# Patient Record
Sex: Male | Born: 1970
Health system: Southern US, Community
[De-identification: ages and names within clinical notes are randomized; demographics above are authoritative.]

## PROBLEM LIST (undated history)

## (undated) DIAGNOSIS — K219 Gastro-esophageal reflux disease without esophagitis: Secondary | ICD-10-CM

## (undated) DIAGNOSIS — E785 Hyperlipidemia, unspecified: Secondary | ICD-10-CM

## (undated) DIAGNOSIS — J302 Other seasonal allergic rhinitis: Secondary | ICD-10-CM

## (undated) DIAGNOSIS — M1990 Unspecified osteoarthritis, unspecified site: Secondary | ICD-10-CM

## (undated) DIAGNOSIS — R519 Headache, unspecified: Secondary | ICD-10-CM

## (undated) DIAGNOSIS — I10 Essential (primary) hypertension: Secondary | ICD-10-CM

## (undated) HISTORY — DX: Headache, unspecified: R51.9

## (undated) HISTORY — DX: Other seasonal allergic rhinitis: J30.2

## (undated) HISTORY — PX: WISDOM TOOTH EXTRACTION: SHX21

## (undated) HISTORY — DX: Hyperlipidemia, unspecified: E78.5

## (undated) HISTORY — DX: Unspecified osteoarthritis, unspecified site: M19.90

## (undated) HISTORY — DX: Essential (primary) hypertension: I10

## (undated) HISTORY — DX: Gastro-esophageal reflux disease without esophagitis: K21.9

---

## 2002-09-23 HISTORY — PX: KNEE SURGERY: SHX244

## 2005-09-23 HISTORY — PX: KNEE SURGERY: SHX244

## 2013-12-15 ENCOUNTER — Emergency Department (HOSPITAL_BASED_OUTPATIENT_CLINIC_OR_DEPARTMENT_OTHER)
Admission: EM | Admit: 2013-12-15 | Discharge: 2013-12-15 | Disposition: A | Discharge status: Home / Self Care | Payer: Managed Care, Other (non HMO) | Attending: Emergency Medicine | Admitting: Emergency Medicine

## 2013-12-15 ENCOUNTER — Emergency Department (HOSPITAL_BASED_OUTPATIENT_CLINIC_OR_DEPARTMENT_OTHER): Payer: Managed Care, Other (non HMO)

## 2013-12-15 ENCOUNTER — Encounter (HOSPITAL_BASED_OUTPATIENT_CLINIC_OR_DEPARTMENT_OTHER): Payer: Self-pay | Admitting: Emergency Medicine

## 2013-12-15 DIAGNOSIS — J020 Streptococcal pharyngitis: Secondary | ICD-10-CM | POA: Insufficient documentation

## 2013-12-15 DIAGNOSIS — R5383 Other fatigue: Secondary | ICD-10-CM

## 2013-12-15 DIAGNOSIS — J159 Unspecified bacterial pneumonia: Secondary | ICD-10-CM | POA: Insufficient documentation

## 2013-12-15 DIAGNOSIS — F17200 Nicotine dependence, unspecified, uncomplicated: Secondary | ICD-10-CM | POA: Insufficient documentation

## 2013-12-15 DIAGNOSIS — J189 Pneumonia, unspecified organism: Secondary | ICD-10-CM

## 2013-12-15 DIAGNOSIS — R61 Generalized hyperhidrosis: Secondary | ICD-10-CM | POA: Insufficient documentation

## 2013-12-15 DIAGNOSIS — R42 Dizziness and giddiness: Secondary | ICD-10-CM | POA: Insufficient documentation

## 2013-12-15 DIAGNOSIS — R5381 Other malaise: Secondary | ICD-10-CM | POA: Insufficient documentation

## 2013-12-15 LAB — BASIC METABOLIC PANEL
BUN: 12 mg/dL (ref 6–23)
CHLORIDE: 97 meq/L (ref 96–112)
CO2: 24 mEq/L (ref 19–32)
Calcium: 9.8 mg/dL (ref 8.4–10.5)
Creatinine, Ser: 1.1 mg/dL (ref 0.50–1.35)
GFR calc non Af Amer: 81 mL/min — ABNORMAL LOW (ref >90)
Glucose, Bld: 123 mg/dL — ABNORMAL HIGH (ref 70–99)
Potassium: 3.7 mEq/L (ref 3.7–5.3)
Sodium: 137 mEq/L (ref 137–147)

## 2013-12-15 LAB — URINALYSIS, ROUTINE W REFLEX MICROSCOPIC
Bilirubin Urine: NEGATIVE
Glucose, UA: NEGATIVE mg/dL
Hgb urine dipstick: NEGATIVE
Ketones, ur: 15 mg/dL — AB
LEUKOCYTES UA: NEGATIVE
NITRITE: NEGATIVE
PROTEIN: 30 mg/dL — AB
Specific Gravity, Urine: 1.031 — ABNORMAL HIGH (ref 1.005–1.030)
UROBILINOGEN UA: 1 mg/dL (ref 0.0–1.0)
pH: 6 (ref 5.0–8.0)

## 2013-12-15 LAB — CBC WITH DIFFERENTIAL/PLATELET
BASOS ABS: 0 10*3/uL (ref 0.0–0.1)
BASOS PCT: 0 % (ref 0–1)
Eosinophils Absolute: 0.1 10*3/uL (ref 0.0–0.7)
Eosinophils Relative: 0 % (ref 0–5)
HCT: 42 % (ref 39.0–52.0)
Hemoglobin: 14.7 g/dL (ref 13.0–17.0)
Lymphocytes Relative: 24 % (ref 12–46)
Lymphs Abs: 4.3 10*3/uL — ABNORMAL HIGH (ref 0.7–4.0)
MCH: 31.3 pg (ref 26.0–34.0)
MCHC: 35 g/dL (ref 30.0–36.0)
MCV: 89.4 fL (ref 78.0–100.0)
Monocytes Absolute: 1.2 10*3/uL — ABNORMAL HIGH (ref 0.1–1.0)
Monocytes Relative: 7 % (ref 3–12)
NEUTROS ABS: 12.3 10*3/uL — AB (ref 1.7–7.7)
NEUTROS PCT: 69 % (ref 43–77)
PLATELETS: 188 10*3/uL (ref 150–400)
RBC: 4.7 MIL/uL (ref 4.22–5.81)
RDW: 13.4 % (ref 11.5–15.5)
WBC: 17.9 10*3/uL — ABNORMAL HIGH (ref 4.0–10.5)

## 2013-12-15 LAB — I-STAT CG4 LACTIC ACID, ED: Lactic Acid, Venous: 2.17 mmol/L (ref 0.5–2.2)

## 2013-12-15 LAB — URINE MICROSCOPIC-ADD ON

## 2013-12-15 LAB — RAPID STREP SCREEN (MED CTR MEBANE ONLY): Streptococcus, Group A Screen (Direct): POSITIVE — AB

## 2013-12-15 IMAGING — CR DG CHEST 2V
2 series · 2 of 2 positions shown · non-contrast
Comparison: None.

CLINICAL DATA: Right-sided chest pain with fever

EXAM:
CHEST  2 VIEW

[w chest pa]
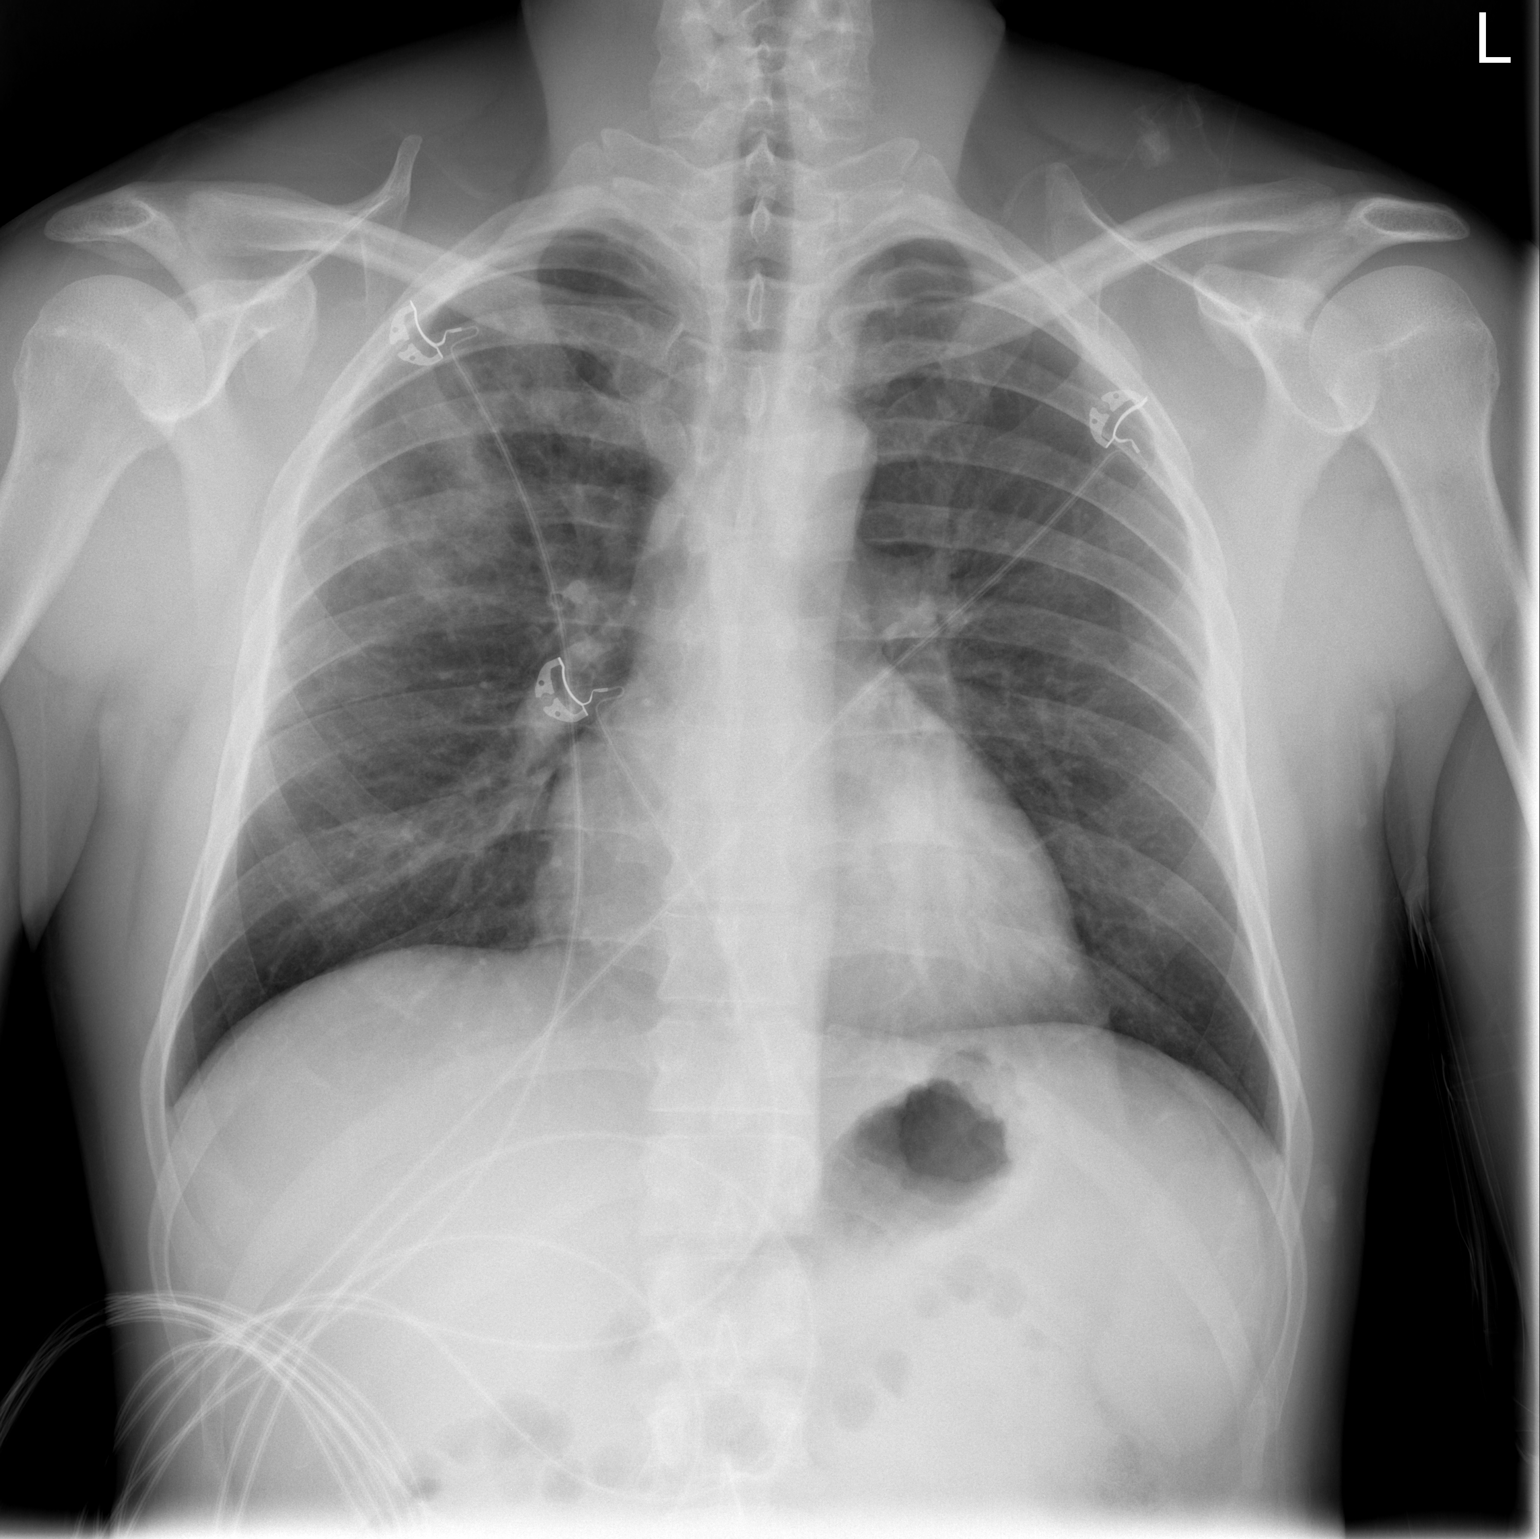

[w chest lat]
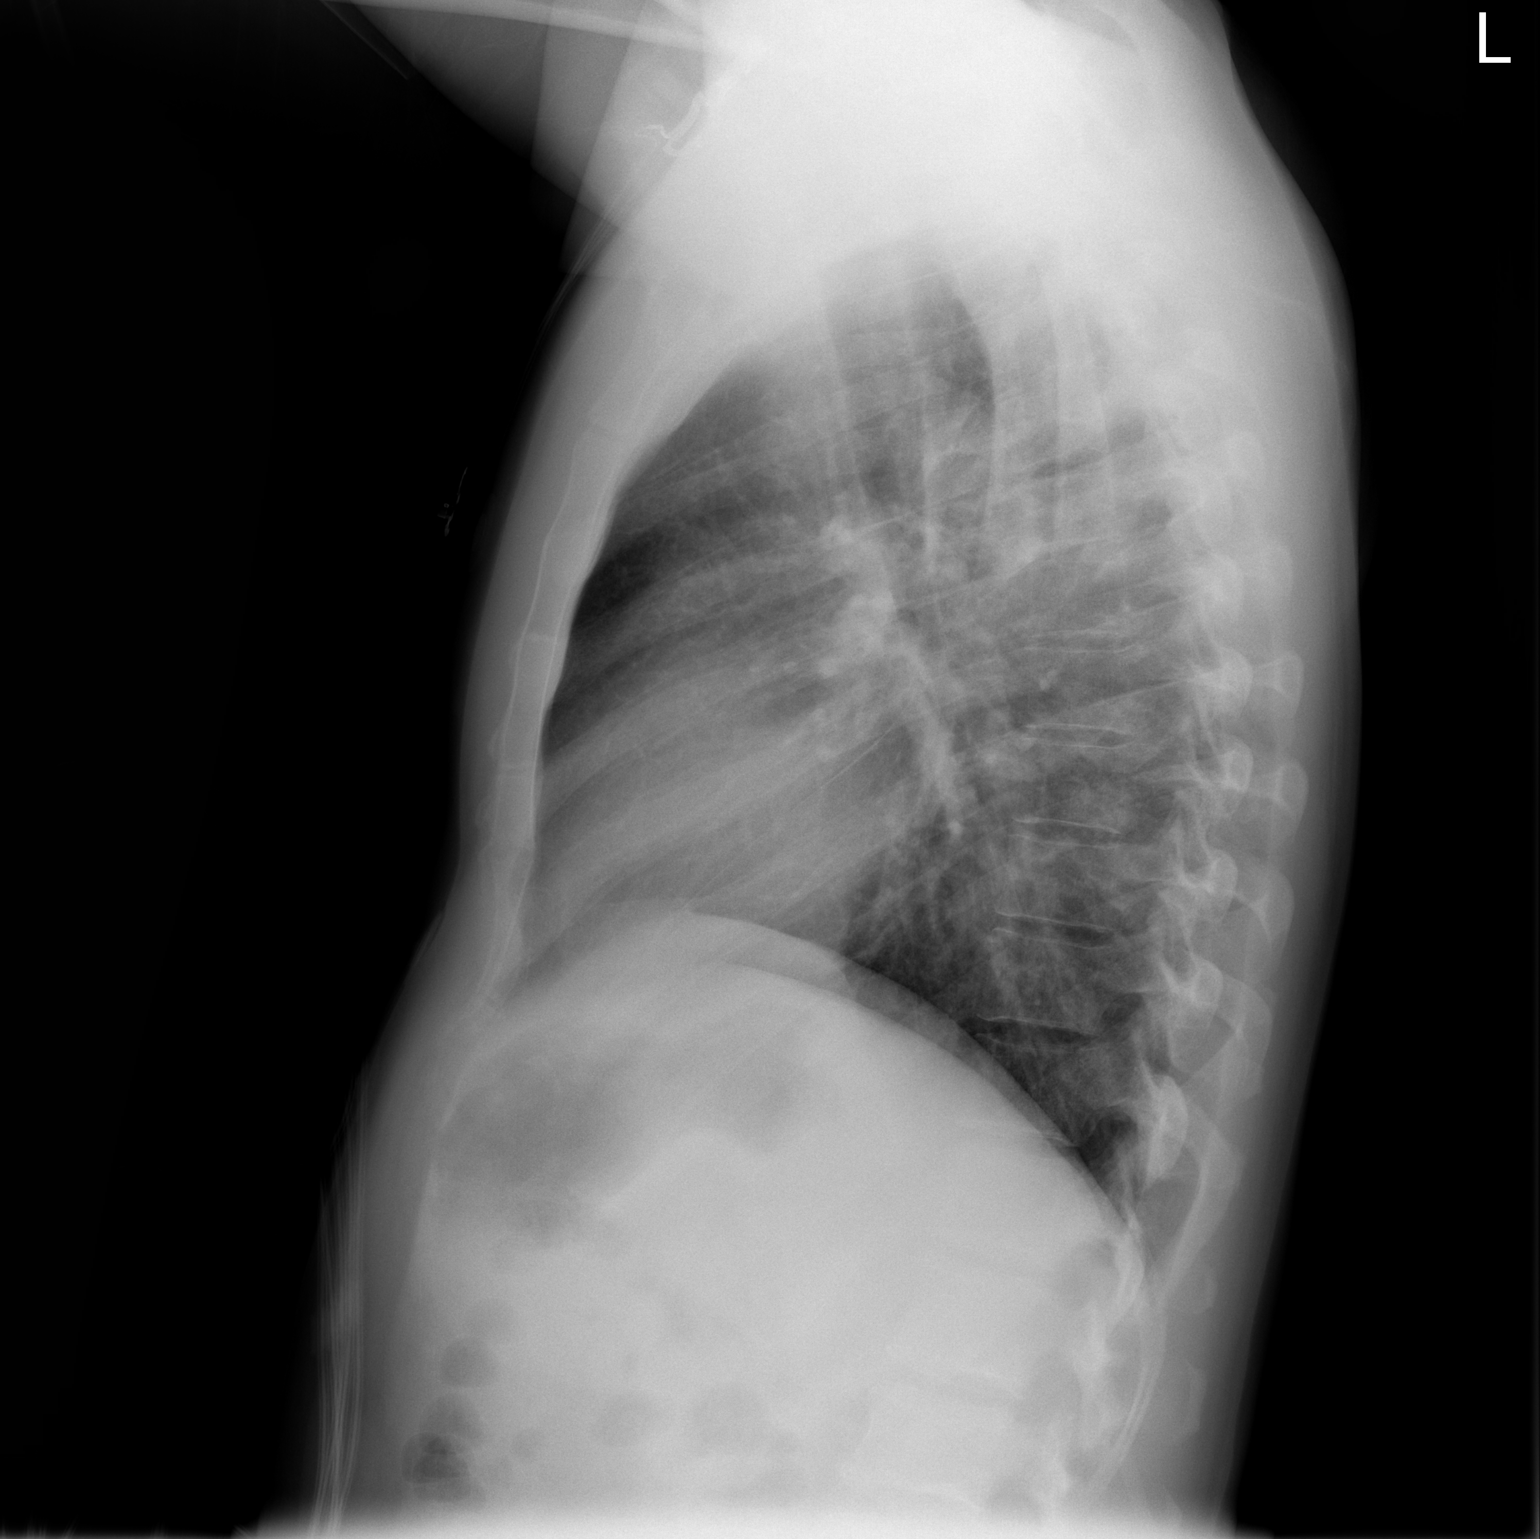

[2 of 2 positions shown; findings below may reference images not displayed]

FINDINGS: Cardiac shadow is stable. The left lung is clear. Right lung is well
aerated but demonstrates patchy infiltrative change in the upper
lung field projecting in the posterior aspect of the right upper
lobe consistent with acute pneumonia. The bony structures are within
normal limits.
IMPRESSION: Right upper lobe pneumonia.

## 2013-12-15 MED ORDER — ACETAMINOPHEN 500 MG PO TABS
1000.0000 mg | ORAL_TABLET | Freq: Once | ORAL | Status: AC
  Administered 2013-12-15: 1000 mg via ORAL
  Filled 2013-12-15: qty 2

## 2013-12-15 MED ORDER — LEVOFLOXACIN 750 MG PO TABS: 750.0000 mg | ORAL_TABLET | Freq: Every day | ORAL | Status: DC

## 2013-12-15 MED ORDER — SODIUM CHLORIDE 0.9 % IV BOLUS (SEPSIS): 1000.0000 mL | Freq: Once | INTRAVENOUS | Status: DC

## 2013-12-15 MED ORDER — SODIUM CHLORIDE 0.9 % IV BOLUS (SEPSIS)
1000.0000 mL | Freq: Once | INTRAVENOUS | Status: AC
  Administered 2013-12-15: 1000 mL via INTRAVENOUS

## 2013-12-15 MED ORDER — ONDANSETRON HCL 4 MG/2ML IJ SOLN
4.0000 mg | Freq: Once | INTRAMUSCULAR | Status: AC
  Administered 2013-12-15: 4 mg via INTRAVENOUS
  Filled 2013-12-15: qty 2

## 2013-12-15 MED ORDER — IOHEXOL 350 MG/ML SOLN
100.0000 mL | Freq: Once | INTRAVENOUS | Status: AC | PRN
  Administered 2013-12-15: 100 mL via INTRAVENOUS

## 2013-12-15 MED ORDER — LEVOFLOXACIN IN D5W 750 MG/150ML IV SOLN
750.0000 mg | Freq: Once | INTRAVENOUS | Status: AC
Start: 2013-12-15 — End: 2013-12-15
  Administered 2013-12-15: 750 mg via INTRAVENOUS
  Filled 2013-12-15: qty 150

## 2013-12-15 MED ORDER — MORPHINE SULFATE 4 MG/ML IJ SOLN
4.0000 mg | Freq: Once | INTRAMUSCULAR | Status: AC
  Administered 2013-12-15: 4 mg via INTRAVENOUS
  Filled 2013-12-15: qty 1

## 2013-12-15 MED ORDER — HYDROCODONE-ACETAMINOPHEN 5-325 MG PO TABS: 2.0000 | ORAL_TABLET | ORAL | Status: DC | PRN

## 2013-12-15 NOTE — ED Provider Notes (Signed)
CSN: [id number]     Arrival date & time 12/15/13  1335 History   None    Chief Complaint  Patient presents with  . Shortness of Breath  . Chest Pain  . Fever     (Consider location/radiation/quality/duration/timing/severity/associated sxs/prior Treatment) HPI Comments: Patient presents to your for evaluation of left chest and rib pain. Patient reports that he had sudden onset of severe pain just prior to arrival.  Patient indicates that he has been sick for 2 days. He has been running a fever at home. This has been associated with sore throat. He has been taking therapy with improvement. He reports that he felt better last night, so he went to work today. He was feeling fine all day, when he suddenly had onset of pain in the left chest wall region while sitting in his work vehicle. Pain has been sharp, stabbing and severe. He reports that he thinks he felt a pop just prior to the onset of the pain. Patient reports that he became very sweaty, weak, dizzy and felt like he was going to pass out.  He has not had any cough or shortness of breath. Patient denies abdominal pain, nausea, vomiting, diarrhea.   History reviewed. No pertinent past medical history. History reviewed. No pertinent past surgical history. No family history on file. History  Substance Use Topics  . Smoking status: Current Every Day Smoker  . Smokeless tobacco: Not on file  . Alcohol Use: No    Review of Systems  Constitutional: Positive for fever, chills and diaphoresis.  HENT: Positive for sore throat.   Cardiovascular: Positive for chest pain.  Gastrointestinal: Negative.   Genitourinary: Negative.   Neurological: Positive for dizziness.  All other systems reviewed and are negative.      Allergies  Review of patient's allergies indicates no known allergies.  Home Medications  No current outpatient prescriptions on file. BP 145/98  Pulse 115  Temp(Src) 103.2 F (39.6 C) (Oral)  Resp 25  Ht 5\' 6"   (1.676 m)  Wt 165 lb (74.844 kg)  BMI 26.64 kg/m2  SpO2 98% Physical Exam  Constitutional: She is oriented to person, place, and time. She appears well-developed and well-nourished. No distress.  HENT:  Head: Normocephalic and atraumatic.  Right Ear: Hearing normal.  Left Ear: Hearing normal.  Nose: Nose normal.  Mouth/Throat: Mucous membranes are normal. Oropharyngeal exudate and posterior oropharyngeal erythema present. No tonsillar abscesses.  Eyes: Conjunctivae and EOM are normal. Pupils are equal, round, and reactive to light.  Neck: Normal range of motion. Neck supple.  Cardiovascular: Regular rhythm, S1 normal and S2 normal.  Exam reveals no gallop and no friction rub.   No murmur heard. Pulmonary/Chest: Effort normal and breath sounds normal. No respiratory distress. She exhibits tenderness. She exhibits no crepitus.    Abdominal: Soft. Normal appearance and bowel sounds are normal. There is no hepatosplenomegaly. There is no tenderness. There is no rebound, no guarding, no tenderness at McBurney's point and negative Murphy's sign. No hernia.  Musculoskeletal: Normal range of motion.  Neurological: She is alert and oriented to person, place, and time. She has normal strength. No cranial nerve deficit or sensory deficit. Coordination normal. GCS eye subscore is 4. GCS verbal subscore is 5. GCS motor subscore is 6.  Skin: Skin is warm and intact. No rash noted. She is diaphoretic. No cyanosis.  Psychiatric: She has a normal mood and affect. Her speech is normal and behavior is normal. Thought content normal.  ED Course  Procedures (including critical care time) Labs Review Labs Reviewed  RAPID STREP SCREEN - Abnormal; Notable for the following:    Streptococcus, Group A Screen (Direct) POSITIVE (*)    All other components within normal limits  CBC WITH DIFFERENTIAL - Abnormal; Notable for the following:    WBC 17.9 (*)    Neutro Abs 12.3 (*)    Lymphs Abs 4.3 (*)     Monocytes Absolute 1.2 (*)    All other components within normal limits  BASIC METABOLIC PANEL - Abnormal; Notable for the following:    Glucose, Bld 123 (*)    GFR calc non Af Amer 81 (*)    All other components within normal limits  CULTURE, BLOOD (ROUTINE X 2)  CULTURE, BLOOD (ROUTINE X 2)  URINALYSIS, ROUTINE W REFLEX MICROSCOPIC  I-STAT CG4 LACTIC ACID, ED   Imaging Review Dg Chest 2 View  12/15/2013   CLINICAL DATA:  Right-sided chest pain with fever  EXAM: CHEST  2 VIEW  COMPARISON:  None.  FINDINGS: Cardiac shadow is stable. The left lung is clear. Right lung is well aerated but demonstrates patchy infiltrative change in the upper lung field projecting in the posterior aspect of the right upper lobe consistent with acute pneumonia. The bony structures are within normal limits.  IMPRESSION: Right upper lobe pneumonia.   Electronically Signed   By: Alcide Clever M.D.   On: 12/15/2013 14:59     EKG Interpretation None      MDM   Final diagnoses:  None    Patient has been sick for 2 days with fever and sore throat. Examination did reveal an exudative tonsillitis. He had fever of 103 arrival. Patient was diaphoretic and complaining of severe, sharp stabbing pains on the left side of his chest. He was splinting somewhat, but did have clear lungs bilaterally. A chest x-ray is suspicious for right upper lobe pneumonia, this does not explain the patient's symptoms. He was positive for strep as well.  Continue with analgesia antibiotics. He was given Tylenol for fever. He will be administered IV Levaquin and will have his strep throat and community acquired pneumonia treated with Levaquin x5 days.  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 The pain in the left side of his chest happened suddenly while he was driving and he has had shortness of breath with it. A CT angiography will be performed to rule out concomitant PE in addition to the diagnosed strep throat and  pneumonia.  Case signed out to Doctor Jeraldine Loots. Followup CT angiography. If negative, discharge with treatment for strep and community acquired pneumonia.    Gilda Crease, MD 12/17/13 1723

## 2013-12-15 NOTE — Discharge Instructions (Signed)
Pneumonia, Adult °Pneumonia is an infection of the lungs.  °CAUSES °Pneumonia may be caused by bacteria or a virus. Usually, these infections are caused by breathing infectious particles into the lungs (respiratory tract). °SYMPTOMS  °· Cough. °· Fever. °· Chest pain. °· Increased rate of breathing. °· Wheezing. °· Mucus production. °DIAGNOSIS  °If you have the common symptoms of pneumonia, your caregiver will typically confirm the diagnosis with a chest X-ray. The X-ray will show an abnormality in the lung (pulmonary infiltrate) if you have pneumonia. Other tests of your blood, urine, or sputum may be done to find the specific cause of your pneumonia. Your caregiver may also do tests (blood gases or pulse oximetry) to see how well your lungs are working. °TREATMENT  °Some forms of pneumonia may be spread to other people when you cough or sneeze. You may be asked to wear a mask before and during your exam. Pneumonia that is caused by bacteria is treated with antibiotic medicine. Pneumonia that is caused by the influenza virus may be treated with an antiviral medicine. Most other viral infections must run their course. These infections will not respond to antibiotics.  °PREVENTION °A pneumococcal shot (vaccine) is available to prevent a common bacterial cause of pneumonia. This is usually suggested for: °· People over 65 years old. °· Patients on chemotherapy. °· People with chronic lung problems, such as bronchitis or emphysema. °· People with immune system problems. °If you are over 65 or have a high risk condition, you may receive the pneumococcal vaccine if you have not received it before. In some countries, a routine influenza vaccine is also recommended. This vaccine can help prevent some cases of pneumonia. You may be offered the influenza vaccine as part of your care. °If you smoke, it is time to quit. You may receive instructions on how to stop smoking. Your caregiver can provide medicines and counseling to  help you quit. °HOME CARE INSTRUCTIONS  °· Cough suppressants may be used if you are losing too much rest. However, coughing protects you by clearing your lungs. You should avoid using cough suppressants if you can. °· Your caregiver may have prescribed medicine if he or she thinks your pneumonia is caused by a bacteria or influenza. Finish your medicine even if you start to feel better. °· Your caregiver may also prescribe an expectorant. This loosens the mucus to be coughed up. °· Only take over-the-counter or prescription medicines for pain, discomfort, or fever as directed by your caregiver. °· Do not smoke. Smoking is a common cause of bronchitis and can contribute to pneumonia. If you are a smoker and continue to smoke, your cough may last several weeks after your pneumonia has cleared. °· A cold steam vaporizer or humidifier in your room or home may help loosen mucus. °· Coughing is often worse at night. Sleeping in a semi-upright position in a recliner or using a couple pillows under your head will help with this. °· Get rest as you feel it is needed. Your body will usually let you know when you need to rest. °SEEK IMMEDIATE MEDICAL CARE IF:  °· Your illness becomes worse. This is especially true if you are elderly or weakened from any other disease. °· You cannot control your cough with suppressants and are losing sleep. °· You begin coughing up blood. °· You develop pain which is getting worse or is uncontrolled with medicines. °· You have a fever. °· Any of the symptoms which initially brought you in for treatment   are getting worse rather than better.  You develop shortness of breath or chest pain. MAKE SURE YOU:   Understand these instructions.  Will watch your condition.  Will get help right away if you are not doing well or get worse. Document Released: 09/09/2005 Document Revised: 12/02/2011 Document Reviewed: 11/29/2010 [facility] Patient Information 2014 [place], [place].  Strep  Throat Strep throat is an infection of the throat caused by a bacteria named Streptococcus pyogenes. Your caregiver may call the infection streptococcal "tonsillitis" or "pharyngitis" depending on whether there are signs of inflammation in the tonsils or back of the throat. Strep throat is most common in children aged 5 15 years during the cold months of the year, but it can occur in people of any age during any season. This infection is spread from person to person (contagious) through coughing, sneezing, or other close contact. SYMPTOMS   Fever or chills.  Painful, swollen, red tonsils or throat.  Pain or difficulty when swallowing.  White or yellow spots on the tonsils or throat.  Swollen, tender lymph nodes or "glands" of the neck or under the jaw.  Red rash all over the body (rare). DIAGNOSIS  Many different infections can cause the same symptoms. A test must be done to confirm the diagnosis so the right treatment can be given. A "rapid strep test" can help your caregiver make the diagnosis in a few minutes. If this test is not available, a light swab of the infected area can be used for a throat culture test. If a throat culture test is done, results are usually available in a day or two. TREATMENT  Strep throat is treated with antibiotic medicine. HOME CARE INSTRUCTIONS   Gargle with 1 tsp of salt in 1 cup of warm water, 3 4 times per day or as needed for comfort.  Family members who also have a sore throat or fever should be tested for strep throat and treated with antibiotics if they have the strep infection.  Make sure everyone in your household washes their hands well.  Do not share food, drinking cups, or personal items that could cause the infection to spread to others.  You may need to eat a soft food diet until your sore throat gets better.  Drink enough water and fluids to keep your urine clear or pale yellow. This will help prevent dehydration.  Get plenty of  rest.  Stay home from school, daycare, or work until you have been on antibiotics for 24 hours.  Only take over-the-counter or prescription medicines for pain, discomfort, or fever as directed by your caregiver.  If antibiotics are prescribed, take them as directed. Finish them even if you start to feel better. SEEK MEDICAL CARE IF:   The glands in your neck continue to enlarge.  You develop a rash, cough, or earache.  You cough up green, yellow-brown, or bloody sputum.  You have pain or discomfort not controlled by medicines.  Your problems seem to be getting worse rather than better. SEEK IMMEDIATE MEDICAL CARE IF:   You develop any new symptoms such as vomiting, severe headache, stiff or painful neck, chest pain, shortness of breath, or trouble swallowing.  You develop severe throat pain, drooling, or changes in your voice.  You develop swelling of the neck, or the skin on the neck becomes red and tender.  You have a fever.  You develop signs of dehydration, such as fatigue, dry mouth, and decreased urination.  You become increasingly sleepy, or you  cannot wake up completely. Document Released: 09/06/2000 Document Revised: 08/26/2012 Document Reviewed: 11/08/2010 [facility] Patient Information 2014 [place], [place].

## 2013-12-15 NOTE — ED Notes (Signed)
Patient transported to X-ray 

## 2013-12-15 NOTE — ED Notes (Signed)
Pt feeling much better.  Discharge instructions reviewed.

## 2013-12-21 LAB — CULTURE, BLOOD (ROUTINE X 2)
Culture: NO GROWTH
Culture: NO GROWTH

## 2013-12-29 ENCOUNTER — Ambulatory Visit (HOSPITAL_BASED_OUTPATIENT_CLINIC_OR_DEPARTMENT_OTHER)
Admission: RE | Admit: 2013-12-29 | Discharge: 2013-12-29 | Disposition: A | Discharge status: Home / Self Care | Payer: Managed Care, Other (non HMO) | Source: Ambulatory Visit | Attending: Family | Admitting: Family

## 2013-12-29 ENCOUNTER — Ambulatory Visit (INDEPENDENT_AMBULATORY_CARE_PROVIDER_SITE_OTHER): Payer: Managed Care, Other (non HMO) | Admitting: Family

## 2013-12-29 ENCOUNTER — Encounter: Payer: Self-pay | Admitting: Family

## 2013-12-29 ENCOUNTER — Telehealth: Payer: Self-pay | Admitting: Family

## 2013-12-29 VITALS — BP 124/90 | HR 91 | Temp 98.0°F | Resp 16 | Ht 67.5 in | Wt 149.1 lb

## 2013-12-29 DIAGNOSIS — R739 Hyperglycemia, unspecified: Secondary | ICD-10-CM

## 2013-12-29 DIAGNOSIS — Z8679 Personal history of other diseases of the circulatory system: Secondary | ICD-10-CM

## 2013-12-29 DIAGNOSIS — Z0000 Encounter for general adult medical examination without abnormal findings: Secondary | ICD-10-CM

## 2013-12-29 DIAGNOSIS — R7309 Other abnormal glucose: Secondary | ICD-10-CM

## 2013-12-29 DIAGNOSIS — E785 Hyperlipidemia, unspecified: Secondary | ICD-10-CM

## 2013-12-29 DIAGNOSIS — J189 Pneumonia, unspecified organism: Secondary | ICD-10-CM | POA: Insufficient documentation

## 2013-12-29 DIAGNOSIS — Z23 Encounter for immunization: Secondary | ICD-10-CM

## 2013-12-29 HISTORY — DX: Personal history of other diseases of the circulatory system: Z86.79

## 2013-12-29 HISTORY — DX: Encounter for general adult medical examination without abnormal findings: Z00.00

## 2013-12-29 HISTORY — DX: Hyperglycemia, unspecified: R73.9

## 2013-12-29 HISTORY — DX: Hyperlipidemia, unspecified: E78.5

## 2013-12-29 LAB — CBC WITH DIFFERENTIAL/PLATELET
Basophils Absolute: 0 10*3/uL (ref 0.0–0.1)
Basophils Relative: 0 % (ref 0–1)
EOS ABS: 0.1 10*3/uL (ref 0.0–0.7)
Eosinophils Relative: 1 % (ref 0–5)
HEMATOCRIT: 41.7 % (ref 39.0–52.0)
Hemoglobin: 14.6 g/dL (ref 13.0–17.0)
Lymphocytes Relative: 51 % — ABNORMAL HIGH (ref 12–46)
Lymphs Abs: 3.3 10*3/uL (ref 0.7–4.0)
MCH: 29.9 pg (ref 26.0–34.0)
MCHC: 35 g/dL (ref 30.0–36.0)
MCV: 85.5 fL (ref 78.0–100.0)
Monocytes Absolute: 0.4 10*3/uL (ref 0.1–1.0)
Monocytes Relative: 7 % (ref 3–12)
Neutro Abs: 2.6 10*3/uL (ref 1.7–7.7)
Neutrophils Relative %: 41 % — ABNORMAL LOW (ref 43–77)
Platelets: 410 10*3/uL — ABNORMAL HIGH (ref 150–400)
RBC: 4.88 MIL/uL (ref 4.22–5.81)
RDW: 14.1 % (ref 11.5–15.5)
WBC: 6.4 10*3/uL (ref 4.0–10.5)

## 2013-12-29 LAB — LIPID PANEL
Cholesterol: 195 mg/dL (ref 0–200)
HDL: 35 mg/dL — AB (ref >39)
LDL Cholesterol: 131 mg/dL — ABNORMAL HIGH (ref 0–99)
Total CHOL/HDL Ratio: 5.6 Ratio
Triglycerides: 144 mg/dL (ref <150)
VLDL: 29 mg/dL (ref 0–40)

## 2013-12-29 LAB — HEMOGLOBIN A1C
HEMOGLOBIN A1C: 5.5 % (ref <5.7)
Mean Plasma Glucose: 111 mg/dL (ref <117)

## 2013-12-29 LAB — HIV ANTIBODY (ROUTINE TESTING W REFLEX): HIV: NONREACTIVE

## 2013-12-29 IMAGING — CR DG CHEST 2V
2 series · 2 of 2 positions shown · non-contrast
Comparison: Chest CT and radiographs [DATE]

CLINICAL DATA: Recent pneumonia.  Follow-up for clearing.

EXAM:
CHEST  2 VIEW

[w chest pa]
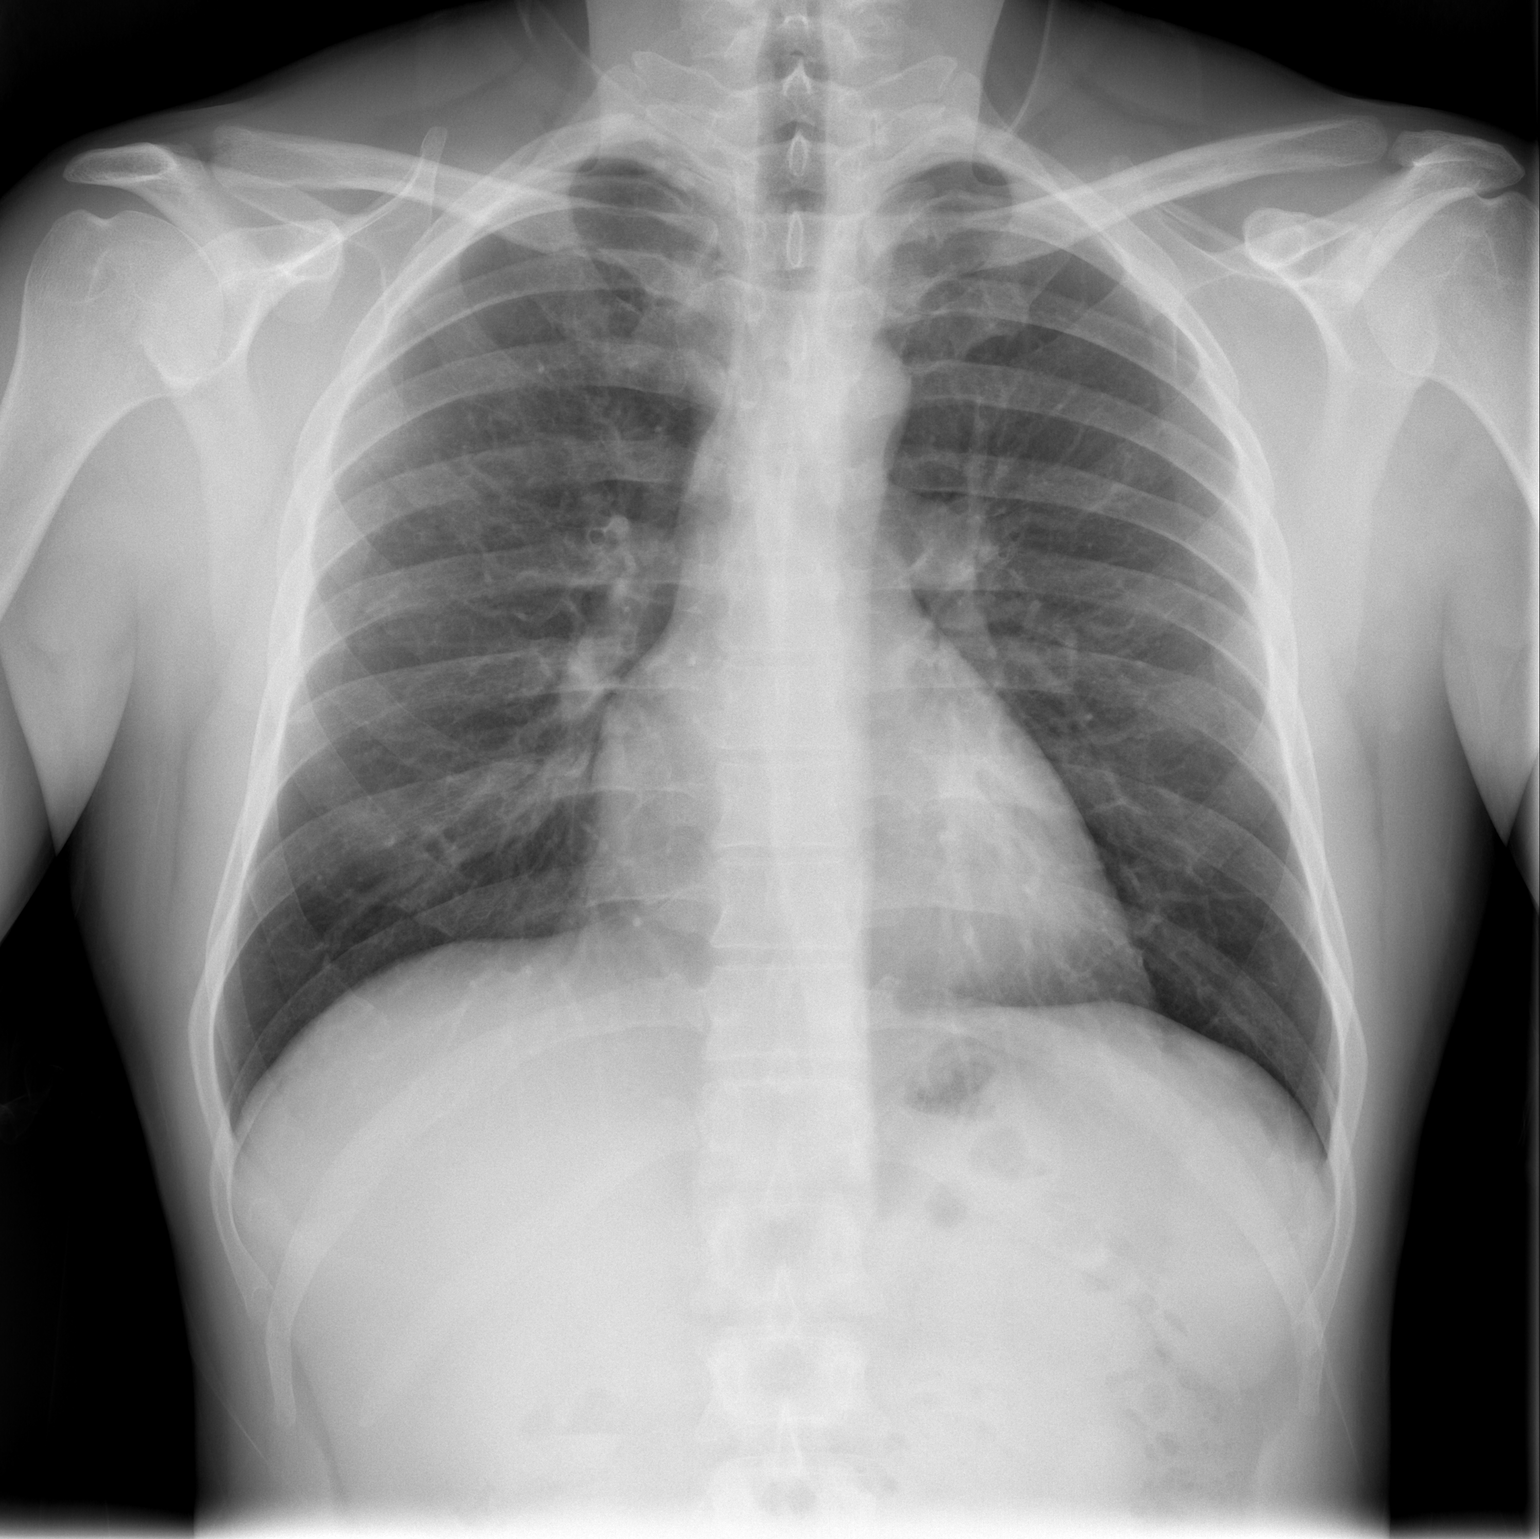

[w chest lat]
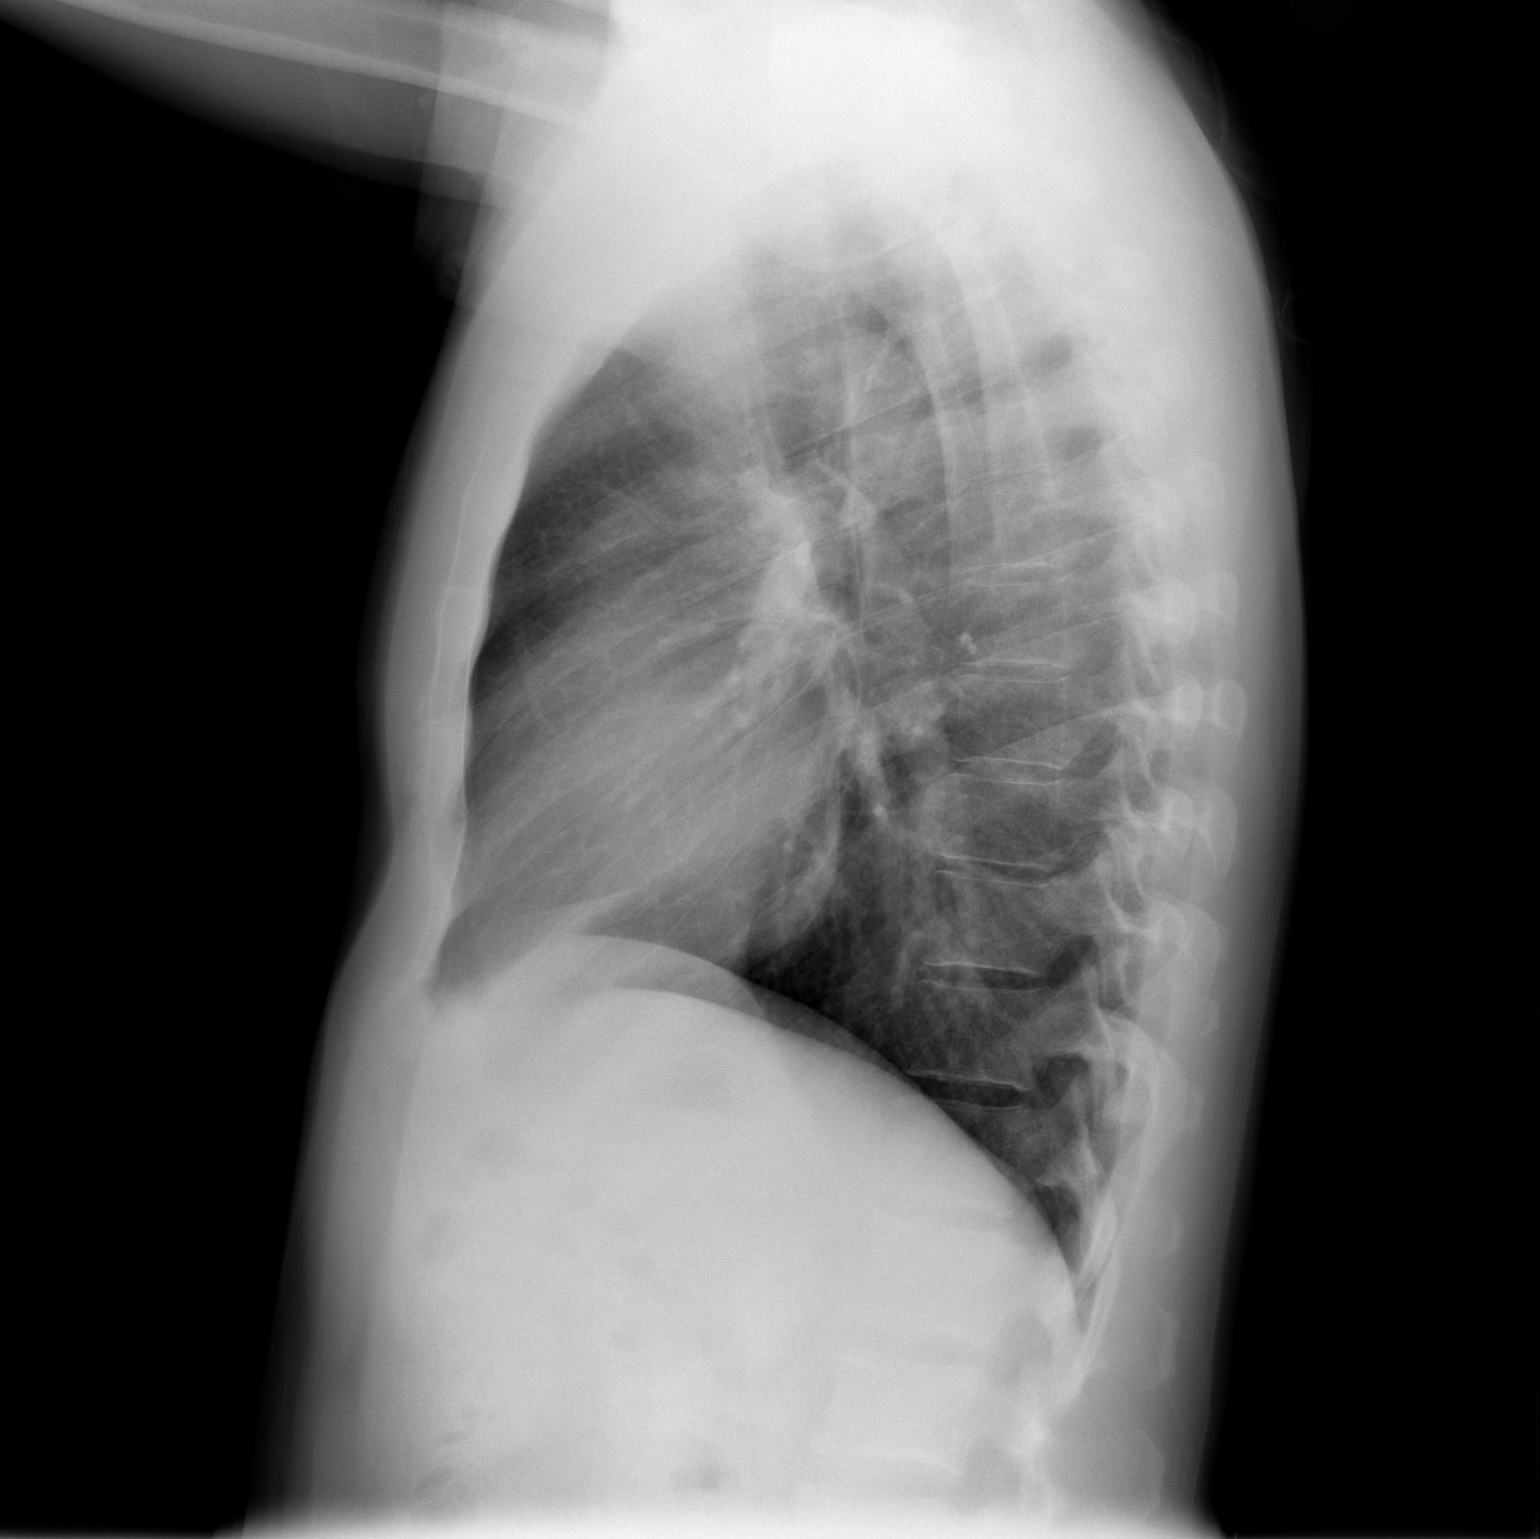

[2 of 2 positions shown; findings below may reference images not displayed]

FINDINGS: The cardiomediastinal silhouette is within normal limits. The lungs
are well inflated. Right upper lobe pneumonia has nearly completely
resolved, with only slightly asymmetric lung markings remaining in
this region. No definite abnormal residual opacity is identified in
the left lower lobe. There is no evidence of new airspace
consolidation, pleural effusion, or pneumothorax. No acute osseous
abnormality is seen.
IMPRESSION: Near complete interval resolution of previously described pneumonia.

## 2013-12-29 NOTE — Assessment & Plan Note (Signed)
Had elevated sugar in ED.  Obtain A1C to rule out DM.

## 2013-12-29 NOTE — Assessment & Plan Note (Signed)
Clinically resolved. Obtain follow up CXR to ensure clearing.

## 2013-12-29 NOTE — Assessment & Plan Note (Signed)
Obtain lipid panel today.  ?

## 2013-12-29 NOTE — Patient Instructions (Signed)
Please complete lab work prior to leaving. Complete chest x ray prior to leaving. Keep up the good work with healthy diet, exercise. Follow up in 6 months.  Sooner if problems/concerns. Welcome to [facility]!

## 2013-12-29 NOTE — Telephone Encounter (Signed)
Relevant patient education mailed to patient.  

## 2013-12-29 NOTE — Progress Notes (Signed)
Pre visit review using our clinic review tool, if applicable. No additional management support is needed unless otherwise documented below in the visit note. 

## 2013-12-29 NOTE — Addendum Note (Signed)
Addended by: Mervin KungFERGERSON, Shanterria Franta A on: 12/29/2013 11:17 AM   Modules accepted: Orders

## 2013-12-29 NOTE — Progress Notes (Signed)
Subjective:    Patient ID: Donnetta SimpersJefferson Ryall, male    DOB: 05/11/1971, 43 y.o.   MRN: [id number]  HPI  Mr. Bascom LevelsFrazier is a 43 yr old male who presents today to establish care. He was evaluated in the ED on 3/25 and diagnosed with multi lobar pneumonia.  He presented to the ED that day due to right sided chest pain with fever.  He was treated with Levaquin.  Reports that he is feeling much better. Denies cough, chest congestion/fever.   ED records are reviewed.  Past medical history is significant for HTN and hyperlipidemia.   BP Readings from Last 3 Encounters:  12/29/13 124/90  12/15/13 123/82  Has been working on his diet.  Trying to work on [employer] food choices.    Sugar was noted to be mildly elevated during his ED visit.    No recent PCP.    Patient presents today for complete physical.  Immunizations: last tetanus >10 yrs ago. Diet: improving Exercise: started working out more.    Review of Systems  Constitutional:       Reports 16 pound weight loss with pneumonia and exercise  HENT: Negative for ear pain.   Respiratory: Negative for shortness of breath.   Cardiovascular: Negative for chest pain.  Gastrointestinal: Negative for diarrhea and constipation.  Genitourinary: Negative for dysuria and frequency.  Musculoskeletal:       Occasional r sided knee pain  Neurological:       Reports frequent headaches which he attributes to sinus.  Worse with leaning forward.   Hematological: Negative for adenopathy.  Psychiatric/Behavioral:       Denies depression/anxiety   Past Medical History  Diagnosis Date  . Hypertension   . Hyperlipidemia     History   Social History  . Marital Status: Married    Spouse Name: N/A    Number of Children: N/A  . Years of Education: N/A   Occupational History  . Not on file.   Social History Main Topics  . Smoking status: Current Every Day Smoker  . Smokeless tobacco: Not on file  . Alcohol Use: Yes     Comment: rarely  .  Drug Use: No  . Sexual Activity: Not on file   Other Topics Concern  . Not on file   Social History Narrative   3 children 43 yr old son, 43 yr old daughter, son age 316   Separated, has joint custody.  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 Knee surgery Left 2004  . Fracture surgery Bilateral     wrist fractures    Family History  Problem Relation Age of Onset  . Heart attack Father   . Cancer Neg Hx   . Diabetes Neg Hx     No Known Allergies  No current outpatient prescriptions on file prior to visit.   No current facility-administered medications on file prior to visit.    BP 124/90  Pulse 91  Temp(Src) 98 F (36.7 C) (Oral)  Resp 16  Ht 5' 7.5" (1.715 m)  Wt 149 lb 1.9 oz (67.64 kg)  BMI 23.00 kg/m2  SpO2 99%       Objective:   Physical Exam  Physical Exam  Constitutional: He is oriented to person, place, and time. He appears well-developed and well-nourished. No  distress.  HENT:  Head: Normocephalic and atraumatic.  Right Ear: Tympanic membrane and ear canal normal.  Left Ear: Tympanic membrane and ear canal normal.  Mouth/Throat: Oropharynx is clear and moist.  Eyes: Pupils are equal, round, and reactive to light. No scleral icterus.  Neck: Normal range of motion. No thyromegaly present.  Cardiovascular: Normal rate and regular rhythm.   No murmur heard. Pulmonary/Chest: Effort normal and breath sounds normal. No respiratory distress. He has no wheezes. He has no rales. He exhibits no tenderness.  Abdominal: Soft. Bowel sounds are normal. He exhibits no distension and no mass. There is no tenderness. There is no rebound and no guarding.  Musculoskeletal: He exhibits no edema.  Lymphadenopathy:    He has no cervical adenopathy.  Neurological: He is alert and oriented to person, place, and  time. He has normal patellar reflexes. He exhibits normal muscle tone. Coordination normal.  Skin: Skin is warm and dry.  Psychiatric: He has a normal mood and affect. His behavior is normal. Judgment and thought content normal.          Assessment & Plan:         Assessment & Plan:

## 2013-12-29 NOTE — Assessment & Plan Note (Signed)
BP looks ok today. We discussed importance of continuing healthy diet, exercise.  Monitor.

## 2013-12-29 NOTE — Assessment & Plan Note (Signed)
Encouraged that he continue healthy diet, exercise and weight loss. Obtain lab work today. Tdap today.

## 2013-12-30 LAB — URINALYSIS, ROUTINE W REFLEX MICROSCOPIC
Bilirubin Urine: NEGATIVE
Glucose, UA: NEGATIVE mg/dL
Hgb urine dipstick: NEGATIVE
Ketones, ur: NEGATIVE mg/dL
Leukocytes, UA: NEGATIVE
Nitrite: NEGATIVE
Protein, ur: NEGATIVE mg/dL
UROBILINOGEN UA: 0.2 mg/dL (ref 0.0–1.0)
pH: 7 (ref 5.0–8.0)

## 2013-12-30 LAB — TSH: TSH: 0.947 u[IU]/mL (ref 0.350–4.500)

## 2013-12-31 ENCOUNTER — Encounter: Payer: Self-pay | Admitting: Family

## 2014-06-27 ENCOUNTER — Ambulatory Visit: Payer: Managed Care, Other (non HMO) | Admitting: Family

## 2014-08-16 ENCOUNTER — Encounter: Payer: Self-pay | Admitting: Physician Assistant

## 2014-08-16 ENCOUNTER — Ambulatory Visit (INDEPENDENT_AMBULATORY_CARE_PROVIDER_SITE_OTHER): Payer: Managed Care, Other (non HMO) | Admitting: Physician Assistant

## 2014-08-16 VITALS — BP 142/99 | HR 108 | Temp 99.5°F | Wt 158.6 lb

## 2014-08-16 DIAGNOSIS — J209 Acute bronchitis, unspecified: Secondary | ICD-10-CM | POA: Insufficient documentation

## 2014-08-16 DIAGNOSIS — H65191 Other acute nonsuppurative otitis media, right ear: Secondary | ICD-10-CM | POA: Insufficient documentation

## 2014-08-16 HISTORY — DX: Acute bronchitis, unspecified: J20.9

## 2014-08-16 MED ORDER — AMOXICILLIN 875 MG PO TABS: 875.0000 mg | ORAL_TABLET | Freq: Two times a day (BID) | ORAL | Status: DC

## 2014-08-16 NOTE — Assessment & Plan Note (Signed)
Rx Amoxicillin. Increase fluids.  Rest. Return precautions discussed with patient.

## 2014-08-16 NOTE — Patient Instructions (Signed)
Please take antibiotic as directed with food.  Stay well hydrated.  Continue Sudafed for congestion.  I also recommend you place a humidifier in your bedroom.  Get plenty of rest.  Follow-up if symptoms are not improving.  Acute Bronchitis Bronchitis is inflammation of the airways that extend from the windpipe into the lungs (bronchi). The inflammation often causes mucus to develop. This leads to a cough, which is the most common symptom of bronchitis.  In acute bronchitis, the condition usually develops suddenly and goes away over time, usually in a couple weeks. Smoking, allergies, and asthma can make bronchitis worse. Repeated episodes of bronchitis may cause further lung problems.  CAUSES Acute bronchitis is most often caused by the same virus that causes a cold. The virus can spread from person to person (contagious) through coughing, sneezing, and touching contaminated objects. SIGNS AND SYMPTOMS   Cough.   Fever.   Coughing up mucus.   Body aches.   Chest congestion.   Chills.   Shortness of breath.   Sore throat.  DIAGNOSIS  Acute bronchitis is usually diagnosed through a physical exam. Your health care provider will also ask you questions about your medical history. Tests, such as chest X-rays, are sometimes done to rule out other conditions.  TREATMENT  Acute bronchitis usually goes away in a couple weeks. Oftentimes, no medical treatment is necessary. Medicines are sometimes given for relief of fever or cough. Antibiotic medicines are usually not needed but may be prescribed in certain situations. In some cases, an inhaler may be recommended to help reduce shortness of breath and control the cough. A cool mist vaporizer may also be used to help thin bronchial secretions and make it easier to clear the chest.  HOME CARE INSTRUCTIONS  Get plenty of rest.   Drink enough fluids to keep your urine clear or pale yellow (unless you have a medical condition that requires  fluid restriction). Increasing fluids may help thin your respiratory secretions (sputum) and reduce chest congestion, and it will prevent dehydration.   Take medicines only as directed by your health care provider.  If you were prescribed an antibiotic medicine, finish it all even if you start to feel better.  Avoid smoking and secondhand smoke. Exposure to cigarette smoke or irritating chemicals will make bronchitis worse. If you are a smoker, consider using nicotine gum or skin patches to help control withdrawal symptoms. Quitting smoking will help your lungs heal faster.   Reduce the chances of another bout of acute bronchitis by washing your hands frequently, avoiding people with cold symptoms, and trying not to touch your hands to your mouth, nose, or eyes.   Keep all follow-up visits as directed by your health care provider.  SEEK MEDICAL CARE IF: Your symptoms do not improve after 1 week of treatment.  SEEK IMMEDIATE MEDICAL CARE IF:  You develop an increased fever or chills.   You have chest pain.   You have severe shortness of breath.  You have bloody sputum.   You develop dehydration.  You faint or repeatedly feel like you are going to pass out.  You develop repeated vomiting.  You develop a severe headache. MAKE SURE YOU:   Understand these instructions.  Will watch your condition.  Will get help right away if you are not doing well or get worse. Document Released: 10/17/2004 Document Revised: 01/24/2014 Document Reviewed: 03/02/2013 [facility] Patient Information 2015 [place], [place]. This information is not intended to replace advice given to you by your  health care provider. Make sure you discuss any questions you have with your health care provider.   Otitis Media Otitis media is redness, soreness, and puffiness (swelling) in the space just behind your eardrum (middle ear). It may be caused by allergies or infection. It often happens along with a  cold. HOME CARE  Take your medicine as told. Finish it even if you start to feel better.  Only take over-the-counter or prescription medicines for pain, discomfort, or fever as told by your doctor.  Follow up with your doctor as told. GET HELP IF:  You have otitis media only in one ear, or bleeding from your nose, or both.  You notice a lump on your neck.  You are not getting better in 3-5 days.  You feel worse instead of better. GET HELP RIGHT AWAY IF:   You have pain that is not helped with medicine.  You have puffiness, redness, or pain around your ear.  You get a stiff neck.  You cannot move part of your face (paralysis).  You notice that the bone behind your ear hurts when you touch it. MAKE SURE YOU:   Understand these instructions.  Will watch your condition.  Will get help right away if you are not doing well or get worse. Document Released: 02/26/2008 Document Revised: 09/14/2013 Document Reviewed: 04/06/2013 [facility] Patient Information 2015 [place], [place]. This information is not intended to replace advice given to you by your health care provider. Make sure you discuss any questions you have with your health care provider.

## 2014-08-16 NOTE — Progress Notes (Signed)
   Patient presents to clinic today c/o sinus pressure, sinus pain, tooth pain x 3 days.  Symptoms are worsening rapidly.  Patient denies fever, but endorses chills.  Denies shortness of breath or chest tightness.  Endorses cough that is becoming productive.  Denies recent travel.  Denies smoking.  Past Medical History  Diagnosis Date  . Hypertension   . Hyperlipidemia     No current outpatient prescriptions on file prior to visit.   No current facility-administered medications on file prior to visit.    No Known Allergies  Family History  Problem Relation Age of Onset  . Heart attack Father   . Cancer Neg Hx   . Diabetes Neg Hx     History   Social History  . Marital Status: Married    Spouse Name: N/A    Number of Children: N/A  . Years of Education: N/A   Social History Main Topics  . Smoking status: Never Smoker   . Smokeless tobacco: None  . Alcohol Use: 0.0 oz/week    0 Not specified per week     Comment: rarely  . Drug Use: No  . Sexual Activity: None   Other Topics Concern  . None   Social History Narrative   3 children 43 yr old son, 43 yr old daughter, son age 746   Separated, has joint custody.  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  All other ROS are negative.  BP 142/99 mmHg  Pulse 108  Temp(Src) 99.5 F (37.5 C) (Oral)  Wt 158 lb 9.6 oz (71.94 kg)  SpO2 99%  Physical Exam  Constitutional: He is oriented to person, place, and time and well-developed, well-nourished, and in no distress.  HENT:  Head: Normocephalic and atraumatic.  Right Ear: External ear and ear canal normal. Tympanic membrane is erythematous and bulging.  Left Ear: Tympanic membrane, external ear and ear canal normal.  Nose: Nose normal.  Mouth/Throat: Uvula is midline, oropharynx is clear and moist and mucous membranes are normal.    Eyes: Conjunctivae are normal. Pupils are equal, round, and reactive to light.  Neck: Neck supple.  Cardiovascular: Normal rate, regular rhythm, normal heart sounds and intact distal pulses.   Pulmonary/Chest: Effort normal and breath sounds normal. No respiratory distress. He has no wheezes. He has no rales. He exhibits no tenderness.  Lymphadenopathy:    He has no cervical adenopathy.  Neurological: He is alert and oriented to person, place, and time.  Skin: Skin is warm and dry. No rash noted.  Psychiatric: Affect normal.  Vitals reviewed.   No results found for this or any previous visit (from the past 2160 hour(s)).  Assessment/Plan: Acute bronchitis Rx Amoxicillin as patient also with AOM.  Increase fluids.  Rest.  Continue Sudafed or a Mucinex DM.  Place a humidifier in the bedroom. Return precautions discussed with patient.  Acute nonsuppurative otitis media of right ear Rx Amoxicillin. Increase fluids.  Rest. Return precautions discussed with patient.

## 2014-08-16 NOTE — Assessment & Plan Note (Signed)
Rx Amoxicillin as patient also with AOM.  Increase fluids.  Rest.  Continue Sudafed or a Mucinex DM.  Place a humidifier in the bedroom. Return precautions discussed with patient.

## 2014-08-16 NOTE — Progress Notes (Signed)
Pre visit review using our clinic review tool, if applicable. No additional management support is needed unless otherwise documented below in the visit note. 

## 2014-11-01 ENCOUNTER — Encounter: Payer: Self-pay | Admitting: Medical

## 2014-11-01 ENCOUNTER — Telehealth: Payer: Self-pay | Admitting: Medical

## 2014-11-01 ENCOUNTER — Ambulatory Visit (INDEPENDENT_AMBULATORY_CARE_PROVIDER_SITE_OTHER): Payer: Managed Care, Other (non HMO) | Admitting: Medical

## 2014-11-01 VITALS — BP 145/99 | HR 83 | Temp 98.1°F | Ht 67.5 in | Wt 160.8 lb

## 2014-11-01 DIAGNOSIS — H6690 Otitis media, unspecified, unspecified ear: Secondary | ICD-10-CM | POA: Insufficient documentation

## 2014-11-01 DIAGNOSIS — H6503 Acute serous otitis media, bilateral: Secondary | ICD-10-CM

## 2014-11-01 DIAGNOSIS — R0981 Nasal congestion: Secondary | ICD-10-CM

## 2014-11-01 MED ORDER — FLUTICASONE PROPIONATE 50 MCG/ACT NA SUSP: 2.0000 | Freq: Every day | NASAL | Status: DC

## 2014-11-01 MED ORDER — AMOXICILLIN-POT CLAVULANATE 875-125 MG PO TABS: 1.0000 | ORAL_TABLET | Freq: Two times a day (BID) | ORAL | Status: DC

## 2014-11-01 NOTE — Assessment & Plan Note (Signed)
Both tm are infected following uri type symptoms. May have early sinus infection. I am prescribing augmentin antibiotic and flonase nasal spray.   Follow up in 7 days or as needed worsening or changing signs or symptoms.

## 2014-11-01 NOTE — Progress Notes (Signed)
Pre visit review using our clinic review tool, if applicable. No additional management support is needed unless otherwise documented below in the visit note. 

## 2014-11-01 NOTE — Patient Instructions (Addendum)
Otitis media Both tm are infected following uri type symptoms. May have early sinus infection. I am prescribing augmentin antibiotic and flonase nasal spray.   Follow up in 7 days or as needed worsening or changing signs or symptoms.

## 2014-11-01 NOTE — Telephone Encounter (Signed)
Chronic nasal congestion for years. Occurred after nose fracture. Pt wants referal to ent. Will write referral.

## 2014-11-01 NOTE — Progress Notes (Signed)
   Subjective:    Patient ID: Darren Scott, male    DOB: 10/25/1970, 44 y.o.   MRN: [id number]  HPI   Pt in states since Saturday had st. Pt has  nasal congestion and some sinus pressure typically this time of the year for 2 weeks or so. But some sinus pressure baseline for years worse in the morning. Some sinus pressure now moderate with bilateral ear pain. Som chest congestion as well.   Last year had pneumonia.  Pt not a smoker.   Review of Systems  Constitutional: Negative for chills and fatigue.  HENT: Positive for ear pain, postnasal drip, rhinorrhea, sinus pressure and sore throat.   Respiratory: Negative for cough, chest tightness, shortness of breath and wheezing.        Brief cough only in am.  Cardiovascular: Negative for chest pain and palpitations.  Musculoskeletal: Negative for back pain.  Neurological: Negative for dizziness, syncope, facial asymmetry, speech difficulty, weakness and headaches.  Hematological: Negative for adenopathy. Does not bruise/bleed easily.    Past Medical History  Diagnosis Date  . Hypertension   . Hyperlipidemia     History   Social History  . Marital Status: Married    Spouse Name: N/A    Number of Children: N/A  . Years of Education: N/A   Occupational History  . Not on file.   Social History Main Topics  . Smoking status: Never Smoker   . Smokeless tobacco: Not on file  . Alcohol Use: 0.0 oz/week    0 Not specified per week     Comment: rarely  . Drug Use: No  . Sexual Activity: Not on file   Other Topics Concern  . Not on file   Social History Narrative   3 children 44 yr old son, 44 yr old daughter, son age 44   Separated, has joint custody.  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 Knee surgery Left 2004  . Fracture surgery Bilateral     wrist  fractures    Family History  Problem Relation Age of Onset  . Heart attack Father   . Cancer Neg Hx   . Diabetes Neg Hx     No Known Allergies  No current outpatient prescriptions on file prior to visit.   No current facility-administered medications on file prior to visit.    BP 145/99 mmHg  Pulse 83  Temp(Src) 98.1 F (36.7 C) (Oral)  Ht 5' 7.5" (1.715 m)  Wt 160 lb 12.8 oz (72.938 kg)  BMI 24.80 kg/m2  SpO2 98%       Objective:   Physical Exam   General- no acute distress, Pleasant pt.  Neck- full Range of motion, no nucal rigidity.    HEENT- Head- Normocephalic. Eyes- PEERL bilaterally, Ears- bilateral both canals clear but both tm are bright  red.  No mastoid tenderness either side. Nose- No frontal but faint  maxillary sinus tenderness. Mild inflamed turbinated Throat- No tonsillar hypertrophy, No exudate.   Lungs- Clear even, and unlabored.  Heart- Regular, rate and rhythm.  Neurologic- CN III-XII grossly intact.           Assessment & Plan:

## 2018-01-02 ENCOUNTER — Ambulatory Visit: Payer: Self-pay | Admitting: *Deleted

## 2018-01-02 ENCOUNTER — Other Ambulatory Visit: Payer: Self-pay

## 2018-01-02 ENCOUNTER — Emergency Department (HOSPITAL_BASED_OUTPATIENT_CLINIC_OR_DEPARTMENT_OTHER)
Admission: EM | Admit: 2018-01-02 | Discharge: 2018-01-02 | Disposition: A | Discharge status: Home / Self Care | Payer: 59 | Attending: Emergency Medicine | Admitting: Emergency Medicine

## 2018-01-02 ENCOUNTER — Encounter (HOSPITAL_BASED_OUTPATIENT_CLINIC_OR_DEPARTMENT_OTHER): Payer: Self-pay

## 2018-01-02 DIAGNOSIS — I10 Essential (primary) hypertension: Secondary | ICD-10-CM

## 2018-01-02 LAB — CBC WITH DIFFERENTIAL/PLATELET
BASOS PCT: 0 %
Basophils Absolute: 0 10*3/uL (ref 0.0–0.1)
EOS PCT: 2 %
Eosinophils Absolute: 0.2 10*3/uL (ref 0.0–0.7)
HCT: 43.9 % (ref 39.0–52.0)
Hemoglobin: 15.2 g/dL (ref 13.0–17.0)
LYMPHS ABS: 2.8 10*3/uL (ref 0.7–4.0)
Lymphocytes Relative: 34 %
MCH: 30.6 pg (ref 26.0–34.0)
MCHC: 34.6 g/dL (ref 30.0–36.0)
MCV: 88.3 fL (ref 78.0–100.0)
MONO ABS: 0.6 10*3/uL (ref 0.1–1.0)
Monocytes Relative: 7 %
Neutro Abs: 4.8 10*3/uL (ref 1.7–7.7)
Neutrophils Relative %: 57 %
Platelets: 196 10*3/uL (ref 150–400)
RBC: 4.97 MIL/uL (ref 4.22–5.81)
RDW: 12.3 % (ref 11.5–15.5)
WBC: 8.4 10*3/uL (ref 4.0–10.5)

## 2018-01-02 LAB — BASIC METABOLIC PANEL
Anion gap: 9 (ref 5–15)
BUN: 18 mg/dL (ref 6–20)
CO2: 24 mmol/L (ref 22–32)
CREATININE: 1.1 mg/dL (ref 0.61–1.24)
Calcium: 9.6 mg/dL (ref 8.9–10.3)
Chloride: 104 mmol/L (ref 101–111)
GFR calc Af Amer: >60 mL/min (ref >60)
GFR calc non Af Amer: >60 mL/min (ref >60)
Glucose, Bld: 93 mg/dL (ref 65–99)
Potassium: 4 mmol/L (ref 3.5–5.1)
Sodium: 137 mmol/L (ref 135–145)

## 2018-01-02 MED ORDER — HYDROCHLOROTHIAZIDE 25 MG PO TABS: 25.0000 mg | ORAL_TABLET | Freq: Every day | ORAL | 4 refills | Status: DC

## 2018-01-02 NOTE — Telephone Encounter (Signed)
  Reason for Disposition . [1] Systolic BP  >= 160 OR Diastolic >= 100 AND [2] cardiac or neurologic symptoms (e.g., chest pain, difficulty breathing, unsteady gait, blurred vision)  Answer Assessment - Initial Assessment Questions 1. BLOOD PRESSURE: "What is the blood pressure?" "Did you take at least two measurements 5 minutes apart?"     181/118 this morning between 8 and 9 2. ONSET: "When did you take your blood pressure?"     yesterday 3. HOW: "How did you obtain the blood pressure?" (e.g., visiting nurse, automatic home BP monitor)     Took at [facility] 4. HISTORY: "Do you have a history of high blood pressure?"     yes 5. MEDICATIONS: "Are you taking any medications for blood pressure?" "Have you missed any doses recently?"     Does not take any medication for b/p 6. OTHER SYMPTOMS: "Do you have any symptoms?" (e.g., headache, chest pain, blurred vision, difficulty breathing, weakness)     Lightheaded 7. PREGNANCY: "Is there any chance you are pregnant?" "When was your last menstrual period?"     no  Protocols used: HIGH BLOOD PRESSURE-A-AH

## 2018-01-02 NOTE — ED Provider Notes (Signed)
[facility] EMERGENCY DEPARTMENT Provider Note   CSN: [id number] Arrival date & time: 01/02/18  1554     History   Chief Complaint Chief Complaint  Patient presents with  . Hypertension    HPI Darren Scott is a 47 y.o. male.  The history is provided by the patient.  Hypertension  This is a recurrent problem. Episode onset: Unclear when this started he has not checked his blood pressure for years but checked it for the first time 2 days ago and it was elevated. The problem occurs constantly. The problem has not changed since onset.Pertinent negatives include no chest pain, no headaches and no shortness of breath. Associated symptoms comments: Patient states he will occasionally get dizzy but it is not constant. Nothing aggravates the symptoms. Nothing relieves the symptoms. Treatments tried: tried to stop eating some salty foods in the last few days but is still eating sausage and bacon. The treatment provided no relief.    Past Medical History:  Diagnosis Date  . Hyperlipidemia   . Hypertension     Patient Active Problem List   Diagnosis Date Noted  . Otitis media 11/01/2014  . Acute nonsuppurative otitis media of right ear 08/16/2014  . Acute bronchitis 08/16/2014  . Routine general medical examination at a health care facility 12/29/2013  . Community acquired pneumonia 12/29/2013  . History of hypertension 12/29/2013  . Other and unspecified hyperlipidemia 12/29/2013  . Hyperglycemia 12/29/2013    Past Surgical History:  Procedure Laterality Date  . KNEE SURGERY Left 2004        Home Medications    Prior to Admission medications   Medication Sig Start Date End Date Taking? Authorizing Provider  amoxicillin-clavulanate (AUGMENTIN) 875-125 MG per tablet Take 1 tablet by mouth 2 (two) times daily. 11/01/14   Saguier, Ramon DredgeEdward, PA-C  fluticasone (FLONASE) 50 MCG/ACT nasal spray Place 2 sprays into both nostrils daily. 11/01/14   Saguier, Ramon DredgeEdward, PA-C    hydrochlorothiazide (HYDRODIURIL) 25 MG tablet Take 1 tablet (25 mg total) by mouth daily. 01/02/18   Gwyneth SproutPlunkett, Jametta Moorehead, MD    Family History Family History  Problem Relation Age of Onset  . Heart attack Father   . Cancer Neg Hx   . Diabetes Neg Hx     Social History Social History   Tobacco Use  . Smoking status: Never Smoker  . Smokeless tobacco: Current User  Substance Use Topics  . Alcohol use: Yes    Alcohol/week: 0.0 oz    Comment: occ  . Drug use: No     Allergies   Patient has no known allergies.   Review of Systems Review of Systems  Respiratory: Negative for shortness of breath.   Cardiovascular: Negative for chest pain.  Neurological: Negative for headaches.  All other systems reviewed and are negative.    Physical Exam Updated Vital Signs BP (!) 154/116 (BP Location: Left Arm)   Pulse 78   Temp 98.4 F (36.9 C) (Oral)   Resp 18   Ht 5\' 6"  (1.676 m)   Wt 71.2 kg (157 lb)   SpO2 98%   BMI 25.34 kg/m   Physical Exam  Constitutional: He is oriented to person, place, and time. He appears well-developed and well-nourished. No distress.  HENT:  Head: Normocephalic and atraumatic.  Mouth/Throat: Oropharynx is clear and moist.  Eyes: Pupils are equal, round, and reactive to light. Conjunctivae and EOM are normal.  Neck: Normal range of motion. Neck supple.  Cardiovascular: Normal rate, regular rhythm  and intact distal pulses.  No murmur heard. Pulmonary/Chest: Effort normal and breath sounds normal. No respiratory distress. He has no wheezes. He has no rales.  Abdominal: Soft. He exhibits no distension. There is no tenderness. There is no rebound and no guarding.  Musculoskeletal: Normal range of motion. He exhibits no edema or tenderness.  Neurological: He is alert and oriented to person, place, and time.  Skin: Skin is warm and dry. No rash noted. No erythema.  Psychiatric: He has a normal mood and affect. His behavior is normal.  Nursing note  and vitals reviewed.    ED Treatments / Results  Labs (all labs ordered are listed, but only abnormal results are displayed) Labs Reviewed  CBC WITH DIFFERENTIAL/PLATELET  BASIC METABOLIC PANEL    EKG None  Radiology No results found.  Procedures Procedures (including critical care time)  Medications Ordered in ED Medications - No data to display   Initial Impression / Assessment and Plan / ED Course  I have reviewed the triage vital signs and the nursing notes.  Pertinent labs & imaging results that were available during my care of the patient were reviewed by me and considered in my medical decision making (see chart for details).     Patient presenting today with hypertension for the last 2 days.  Patient has not checked his blood pressure in over a few years but states he was feeling a little bit dizzy and had intermittent headaches so decided to check in and it was elevated.  Patient was on blood pressure medication 10 years ago but weaned himself off by changing his diet.  Since that time he changed insurance and never got a new doctor.  He denies any chest pain, shortness of breath or any symptoms concerning for endorgan damage.  He is well-appearing on exam but does have persistent hypertension in the 150-170 range over 110-118. Patient CBC and BMP are within normal limits with a normal creatinine.  Will start on HCTZ and recommended diet changes and follow-up with PCP  Final Clinical Impressions(s) / ED Diagnoses   Final diagnoses:  Essential hypertension    ED Discharge Orders        Ordered    hydrochlorothiazide (HYDRODIURIL) 25 MG tablet  Daily     01/02/18 1802       Gwyneth Sprout, MD 01/02/18 1815

## 2018-01-02 NOTE — Telephone Encounter (Signed)
Pt called with stating his b/p had been up to 181/118 on yesterday. He reported being lightheaded. He denied any other symptoms and stating I feel fine.  [facility]  He is unable to check his b/p now, he is at work. He stated he had been on b/p medications before but not now. Advised him that he could have a stroke. Advised him to go check his b/p and go to the emergency department to be assessed.  When talking with patient and he saw that I was advising the ER, he stated that the phone was breaking up twice and then hung the phone up.  [facility] conversation with this patient, spoke with Windell MouldingRuth there.

## 2018-01-02 NOTE — ED Triage Notes (Signed)
Pt states his BP has been elevated x 2 days-[facility] due to "not feeling good" and dizziness-hx of HTN-on meds in the past but stopped on his own 10 yrs-denies HA, CP-NAD-steady gait

## 2018-01-05 NOTE — Telephone Encounter (Signed)
Called patient to scheduled f/up as indicated by the ED, he did not pick up. Left detailed massage for him to call us back ane the importance of f/up to check bp and make sure medication is working.

## 2018-01-05 NOTE — Telephone Encounter (Signed)
Patient did go to the ED on Friday 01-02-18.

## 2018-01-07 NOTE — Telephone Encounter (Signed)
Patient is scheuled for 01-14-18

## 2018-01-14 ENCOUNTER — Ambulatory Visit: Payer: 59 | Admitting: Family

## 2018-01-14 ENCOUNTER — Encounter: Payer: Self-pay | Admitting: Family

## 2018-01-14 VITALS — BP 148/99 | HR 73 | Temp 98.3°F | Resp 16 | Ht 67.5 in | Wt 153.8 lb

## 2018-01-14 DIAGNOSIS — I10 Essential (primary) hypertension: Secondary | ICD-10-CM | POA: Diagnosis not present

## 2018-01-14 MED ORDER — AMLODIPINE BESYLATE 5 MG PO TABS
5.0000 mg | ORAL_TABLET | Freq: Every day | ORAL | 3 refills | Status: DC
Start: 2018-01-14 — End: 2018-02-11

## 2018-01-14 NOTE — Patient Instructions (Addendum)
Stop hctz, start amlodipine.  Continue low sodium diet.

## 2018-01-14 NOTE — Progress Notes (Signed)
Subjective:    Patient ID: Darren Scott, male    DOB: 03/23/1971, 47 y.o.   MRN: [id number]  HPI  Pt is a 47 yr old male who presents today for follow up of his hypertension. He was seen in the ED on 4/12 for HTN and was placed on HCTZ. Reports that since he started hctz, he has had HA all day long.  He took hctz this AM.  ED record is reviewed.  BP Readings from Last 3 Encounters:  01/14/18 (!) 148/99  01/02/18 (!) 154/116  11/01/14 (!) 145/99    Review of Systems    see HPI  Past Medical History:  Diagnosis Date  . Hyperlipidemia   . Hypertension      Social History   Socioeconomic History  . Marital status: Married    Spouse name: Not on file  . Number of children: Not on file  . Years of education: Not on file  . Highest education level: Not on file  Occupational History  . Not on file  Social Needs  . Financial resource strain: Not on file  . Food insecurity:    Worry: Not on file    Inability: Not on file  . Transportation needs:    Medical: Not on file    Non-medical: Not on file  Tobacco Use  . Smoking status: Never Smoker  . Smokeless tobacco: Current User  Substance and Sexual Activity  . Alcohol use: Yes    Alcohol/week: 0.0 oz    Comment: occ  . Drug use: No  . Sexual activity: Not on file  Lifestyle  . Physical activity:    Days per week: Not on file    Minutes per session: Not on file  . Stress: Not on file  Relationships  . Social connections:    Talks on phone: Not on file    Gets together: Not on file    Attends religious service: Not on file    Active member of club or organization: Not on file    Attends meetings of clubs or organizations: Not on file    Relationship status: Not on file  . Intimate partner violence:    Fear of current or ex partner: Not on file    Emotionally abused: Not on file    Physically abused: Not on file    Forced sexual activity: Not on file  Other Topics Concern  . Not on file  Social History  Narrative   3 children 47 yr old son, 47 yr old daughter, son age 626   Separated, has joint custody.  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 KNEE SURGERY Left 2004    Family History  Problem Relation Age of Onset  . Heart attack Father   . Cancer Neg Hx   . Diabetes Neg Hx     No Known Allergies  Current Outpatient Medications on File Prior to Visit  Medication Sig Dispense Refill  . amoxicillin-clavulanate (AUGMENTIN) 875-125 MG per tablet Take 1 tablet by mouth 2 (two) times daily. 20 tablet 0  . fluticasone (FLONASE) 50 MCG/ACT nasal spray Place 2 sprays into both nostrils daily. 16 g 1  . hydrochlorothiazide (HYDRODIURIL) 25 MG tablet Take 1 tablet (25 mg total) by mouth daily. 30 tablet 4   No current facility-administered  medications on file prior to visit.     BP (!) 148/99 (BP Location: Right Arm, Patient Position: Sitting, Cuff Size: Small)   Pulse 73   Temp 98.3 F (36.8 C) (Oral)   Resp 16   Ht 5' 7.5" (1.715 m)   Wt 153 lb 12.8 oz (69.8 kg)   SpO2 100%   BMI 23.73 kg/m    Objective:   Physical Exam  Constitutional: He is oriented to person, place, and time. He appears well-developed and well-nourished. No distress.  HENT:  Head: Normocephalic and atraumatic.  Cardiovascular: Normal rate and regular rhythm.  No murmur heard. Pulmonary/Chest: Effort normal and breath sounds normal. No respiratory distress. He has no wheezes. He has no rales.  Musculoskeletal: He exhibits no edema.  Neurological: He is alert and oriented to person, place, and time.  Skin: Skin is warm and dry.  Psychiatric: He has a normal mood and affect. His behavior is normal. Thought content normal.          Assessment & Plan:  HTN- improved but still above goal.  HCTZ is causing HA. Advised pt as follows:  Stop  hctz, start amlodipine.  Continue low sodium diet.

## 2018-02-11 ENCOUNTER — Encounter: Payer: Self-pay | Admitting: Family

## 2018-02-11 ENCOUNTER — Ambulatory Visit (INDEPENDENT_AMBULATORY_CARE_PROVIDER_SITE_OTHER): Payer: 59 | Admitting: Family

## 2018-02-11 VITALS — BP 141/102 | HR 80 | Temp 98.2°F | Resp 16 | Ht 68.0 in | Wt 156.4 lb

## 2018-02-11 DIAGNOSIS — R5383 Other fatigue: Secondary | ICD-10-CM | POA: Diagnosis not present

## 2018-02-11 DIAGNOSIS — K219 Gastro-esophageal reflux disease without esophagitis: Secondary | ICD-10-CM

## 2018-02-11 DIAGNOSIS — R0789 Other chest pain: Secondary | ICD-10-CM | POA: Diagnosis not present

## 2018-02-11 DIAGNOSIS — I10 Essential (primary) hypertension: Secondary | ICD-10-CM | POA: Diagnosis not present

## 2018-02-11 DIAGNOSIS — Z0000 Encounter for general adult medical examination without abnormal findings: Secondary | ICD-10-CM

## 2018-02-11 MED ORDER — AMLODIPINE BESYLATE 10 MG PO TABS: 10.0000 mg | ORAL_TABLET | Freq: Every day | ORAL | 3 refills | Status: DC

## 2018-02-11 NOTE — Progress Notes (Signed)
Subjective:    Patient ID: Darren Scott, male    DOB: 1970-10-02, 47 y.o.   MRN: [id number]  HPI  Darren Scott is a 47 yr old male who presents today for complete physical.  Immunizations: tdap 2015 Diet: needs improvement Exercise: has a treadmill at - not using  Vision: wears reading glasses, never had an eye exam. Dental: 3/19  HTN-Last visit we started patient on amlodipine for his hypertension. HA's have resolved since he discontinued hctz.  BP Readings from Last 3 Encounters:  02/11/18 (!) 141/102  01/14/18 (!) 148/99  01/02/18 (!) 154/116   GERD- c/o recent gerd symptoms.   Atypical chest pain- see below.    Reports poor energy- Reports that he falls asleep 11-12 and wakes at 3 AM. Naps in the afternoons  Review of Systems  Constitutional: Negative for unexpected weight change.  HENT: Negative for hearing loss and rhinorrhea.   Eyes:       Some reading difficulty  Respiratory: Negative for cough.   Cardiovascular: Negative for leg swelling.       Reports occasional left sided chest pain, "like someone is putting a little needle" has been like this for years.   Gastrointestinal: Negative for blood in stool, constipation and diarrhea.  Genitourinary: Negative for dysuria, frequency and hematuria.  Musculoskeletal: Negative for arthralgias and myalgias.  Skin: Negative for rash.  Neurological: Positive for headaches.  Hematological: Negative for adenopathy.  Psychiatric/Behavioral:       Denies depression/anxiety   Past Medical History:  Diagnosis Date  . Hyperlipidemia   . Hypertension      Social History   Socioeconomic History  . Marital status: Married    Spouse name: Not on file  . Number of children: Not on file  . Years of education: Not on file  . Highest education level: Not on file  Occupational History  . Not on file  Social Needs  . Financial resource strain: Not on file  . Food insecurity:    Worry: Not on file    Inability: Not on  file  . Transportation needs:    Medical: Not on file    Non-medical: Not on file  Tobacco Use  . Smoking status: Never Smoker  . Smokeless tobacco: Current User  Substance and Sexual Activity  . Alcohol use: Yes    Alcohol/week: 0.0 oz    Comment: occ  . Drug use: No  . Sexual activity: Not on file  Lifestyle  . Physical activity:    Days per week: Not on file    Minutes per session: Not on file  . Stress: Not on file  Relationships  . Social connections:    Talks on phone: Not on file    Gets together: Not on file    Attends religious service: Not on file    Active member of club or organization: Not on file    Attends meetings of clubs or organizations: Not on file    Relationship status: Not on file  . Intimate partner violence:    Fear of current or ex partner: Not on file    Emotionally abused: Not on file    Physically abused: Not on file    Forced sexual activity: Not on file  Other Topics Concern  . Not on file  Social History Narrative   3 children 74 yr old son, 8 yr old daughter, son age 7   Separated, has joint custody.  Oldest son lives with his first  wife in [place]   Works as [employer]- replaces [employer]   Completed 8th grade   Enjoys- hunting/fishing golfing    Past Surgical History:  Procedure Laterality Date  . KNEE SURGERY Left 2004    Family History  Problem Relation Age of Onset  . Heart attack Father   . Cancer Neg Hx   . Diabetes Neg Hx     No Known Allergies  No current outpatient medications on file prior to visit.   No current facility-administered medications on file prior to visit.     BP (!) 141/102 (BP Location: Left Arm, Cuff Size: Normal)   Pulse 80   Temp 98.2 F (36.8 C) (Oral)   Resp 16   Ht  (1.727 m)   Wt 156 lb 6.4 oz (70.9 kg) Comment: 32" waist circumference  SpO2 100%   BMI 23.78 kg/m      Objective:   Physical Exam Physical Exam  Constitutional: He is oriented to person, place, and time. He  appears well-developed and well-nourished. No distress.  HENT:  Head: Normocephalic and atraumatic.  Right Ear: Tympanic membrane and ear canal normal.  Left Ear: Tympanic membrane and ear canal normal.  Mouth/Throat: Oropharynx is clear and moist.  Eyes: Pupils are equal, round, and reactive to light. No scleral icterus.  Neck: Normal range of motion. No thyromegaly present.  Cardiovascular: Normal rate and regular rhythm.   No murmur heard. Pulmonary/Chest: Effort normal and breath sounds normal. No respiratory distress. He has no wheezes. He has no rales. He exhibits no tenderness.  Abdominal: Soft. Bowel sounds are normal. He exhibits no distension and no mass. There is no tenderness. There is no rebound and no guarding.  Musculoskeletal: He exhibits no edema.  Lymphadenopathy:    He has no cervical adenopathy.  Neurological: He is alert and oriented to person, place, and time. He has normal patellar reflexes. He exhibits normal muscle tone. Coordination normal.  Skin: Skin is warm and dry.  Psychiatric: He has a normal mood and affect. His behavior is normal. Judgment and thought content normal.           Assessment & Plan:  Preventative care-immunizations reviewed and up to date.  Obtain routine lab work. Discussed healthy diet and regular exercise. Stressed importance of quitting use of chewing tobacco.  Atypical chest pain- EKG tracing is personally reviewed.  EKG notes NSR.  No acute changes.  Will refer to cardiology for further evaluation. He thinks his dad may have died from MI but he does not know much about his history. He understands that he should proceed to the ER if he develops severe CP.   GERD- discussed gerd diet.  HTN-uncontrolled. Increase amlodipine form  to .  Fatigue- discussed good sleep hygiene.          Assessment & Plan:

## 2018-02-11 NOTE — Patient Instructions (Addendum)
Please increase amlodipine to  once daily.  Please schedule a routine eye exam with an optometrist. You will be contacted about your referral to cardiology.    Food Choices for Gastroesophageal Reflux Disease, Adult When you have gastroesophageal reflux disease (GERD), the foods you eat and your eating habits are very important. Choosing the right foods can help ease your discomfort. What guidelines do I need to follow?  Choose fruits, vegetables, whole grains, and low-fat dairy products.  Choose low-fat meat, fish, and poultry.  Limit fats such as oils, salad dressings, butter, nuts, and avocado.  Keep a food diary. This helps you identify foods that cause symptoms.  Avoid foods that cause symptoms. These may be different for everyone.  Eat small meals often instead of 3 large meals a day.  Eat your meals slowly, in a place where you are relaxed.  Limit fried foods.  Cook foods using methods other than frying.  Avoid drinking alcohol.  Avoid drinking large amounts of liquids with your meals.  Avoid bending over or lying down until 2-3 hours after eating. What foods are not recommended? These are some foods and drinks that may make your symptoms worse: Vegetables Tomatoes. Tomato juice. Tomato and spaghetti sauce. Chili peppers. Onion and garlic. Horseradish. Fruits Oranges, grapefruit, and lemon (fruit and juice). Meats High-fat meats, fish, and poultry. This includes hot dogs, ribs, ham, sausage, salami, and bacon. Dairy Whole milk and chocolate milk. Sour cream. Cream. Butter. Ice cream. Cream cheese. Drinks Coffee and tea. Bubbly (carbonated) drinks or energy drinks. Condiments Hot sauce. Barbecue sauce. Sweets/Desserts Chocolate and cocoa. Donuts. Peppermint and spearmint. Fats and Oils High-fat foods. This includes [place] fries and potato chips. Other Vinegar. Strong spices. This includes black pepper, white pepper, red pepper, cayenne, curry powder,  cloves, ginger, and chili powder. The items listed above may not be a complete list of foods and drinks to avoid. Contact your dietitian for more information. This information is not intended to replace advice given to you by your health care provider. Make sure you discuss any questions you have with your health care provider. Document Released: 03/10/2012 Document Revised: 02/15/2016 Document Reviewed: 07/14/2013 Elsevier Interactive Patient Education  2017 [employer].

## 2018-02-20 ENCOUNTER — Ambulatory Visit: Payer: 59 | Admitting: Cardiology

## 2018-02-20 ENCOUNTER — Encounter: Payer: Self-pay | Admitting: Cardiology

## 2018-02-20 VITALS — BP 132/90 | HR 82 | Ht 68.0 in | Wt 154.8 lb

## 2018-02-20 DIAGNOSIS — R0789 Other chest pain: Secondary | ICD-10-CM

## 2018-02-20 DIAGNOSIS — E785 Hyperlipidemia, unspecified: Secondary | ICD-10-CM | POA: Insufficient documentation

## 2018-02-20 DIAGNOSIS — R0683 Snoring: Secondary | ICD-10-CM | POA: Diagnosis not present

## 2018-02-20 DIAGNOSIS — Z9189 Other specified personal risk factors, not elsewhere classified: Secondary | ICD-10-CM

## 2018-02-20 DIAGNOSIS — Z8679 Personal history of other diseases of the circulatory system: Secondary | ICD-10-CM | POA: Diagnosis not present

## 2018-02-20 HISTORY — DX: Other chest pain: R07.89

## 2018-02-20 HISTORY — DX: Hyperlipidemia, unspecified: E78.5

## 2018-02-20 NOTE — Patient Instructions (Signed)
Medication Instructions:  Your physician recommends that you continue on your current medications as directed. Please refer to the Current Medication list given to you today.  Labwork: None  Testing/Procedures: You have been referred for a sleep study. You will be contacted to schedule this appointment.   Follow-Up: Your physician wants you to follow-up in: 6 months. You will receive a reminder letter in the mail two months in advance. If you don't receive a letter, please call our office to schedule the follow-up appointment.   If you need a refill on your cardiac medications before your next appointment, please call your pharmacy.   Thank you for choosing [facility]! Mady Gemma, RN [phone]
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
## 2018-03-11 NOTE — Addendum Note (Signed)
Addended by: Crist FatLOCKHART, Shahid Flori P on: 03/11/2018 08:37 AM   Modules accepted: Orders

## 2018-03-12 ENCOUNTER — Telehealth: Payer: Self-pay | Admitting: *Deleted

## 2018-03-12 ENCOUNTER — Other Ambulatory Visit: Payer: Self-pay | Admitting: Cardiology

## 2018-03-12 DIAGNOSIS — R0683 Snoring: Secondary | ICD-10-CM

## 2018-03-12 DIAGNOSIS — R400 Somnolence: Secondary | ICD-10-CM

## 2018-03-12 NOTE — Telephone Encounter (Signed)
[facility] denied in lab sleep study. Ok to schedule HST.

## 2018-03-12 NOTE — Telephone Encounter (Signed)
[facility]- Denied. HST no PA required. Santina Evansatherine, Dr Almon HerculesKrasaoski's nurse notified via staff message.

## 2018-03-12 NOTE — Telephone Encounter (Signed)
-----   Message from Crist Fatatherine P Lockhart, RN sent at 03/11/2018  8:36 AM EDT ----- Regarding: Please schedule Please schedule this patient for a sleep study due to snoring in evaluation of sleep apnea. Thanks!

## 2018-03-13 ENCOUNTER — Telehealth: Payer: Self-pay | Admitting: *Deleted

## 2018-03-13 NOTE — Telephone Encounter (Signed)
Left message to return call regarding sleep study scheduled that has been scheduled.

## 2018-03-19 ENCOUNTER — Ambulatory Visit (INDEPENDENT_AMBULATORY_CARE_PROVIDER_SITE_OTHER): Payer: 59 | Admitting: Family

## 2018-03-19 ENCOUNTER — Encounter: Payer: Self-pay | Admitting: Family

## 2018-03-19 VITALS — BP 138/90 | HR 81 | Temp 98.5°F | Resp 16 | Ht 68.0 in | Wt 153.8 lb

## 2018-03-19 DIAGNOSIS — I10 Essential (primary) hypertension: Secondary | ICD-10-CM

## 2018-03-19 DIAGNOSIS — R5383 Other fatigue: Secondary | ICD-10-CM

## 2018-03-19 DIAGNOSIS — Z0000 Encounter for general adult medical examination without abnormal findings: Secondary | ICD-10-CM

## 2018-03-19 LAB — BASIC METABOLIC PANEL
BUN: 17 mg/dL (ref 6–23)
CALCIUM: 9.6 mg/dL (ref 8.4–10.5)
CO2: 33 mEq/L — ABNORMAL HIGH (ref 19–32)
CREATININE: 1 mg/dL (ref 0.40–1.50)
Chloride: 101 mEq/L (ref 96–112)
GFR: 85.01 mL/min (ref >60.00)
Glucose, Bld: 87 mg/dL (ref 70–99)
POTASSIUM: 4.2 meq/L (ref 3.5–5.1)
Sodium: 140 mEq/L (ref 135–145)

## 2018-03-19 LAB — CBC WITH DIFFERENTIAL/PLATELET
BASOS PCT: 0.4 % (ref 0.0–3.0)
Basophils Absolute: 0 10*3/uL (ref 0.0–0.1)
EOS PCT: 2.4 % (ref 0.0–5.0)
Eosinophils Absolute: 0.1 10*3/uL (ref 0.0–0.7)
HCT: 41.2 % (ref 39.0–52.0)
Hemoglobin: 14.5 g/dL (ref 13.0–17.0)
LYMPHS ABS: 2.2 10*3/uL (ref 0.7–4.0)
Lymphocytes Relative: 43.6 % (ref 12.0–46.0)
MCHC: 35.1 g/dL (ref 30.0–36.0)
MCV: 89.8 fl (ref 78.0–100.0)
MONO ABS: 0.5 10*3/uL (ref 0.1–1.0)
Monocytes Relative: 10.1 % (ref 3.0–12.0)
NEUTROS ABS: 2.2 10*3/uL (ref 1.4–7.7)
NEUTROS PCT: 43.5 % (ref 43.0–77.0)
PLATELETS: 218 10*3/uL (ref 150.0–400.0)
RBC: 4.58 Mil/uL (ref 4.22–5.81)
RDW: 12.7 % (ref 11.5–15.5)
WBC: 5.2 10*3/uL (ref 4.0–10.5)

## 2018-03-19 LAB — LIPID PANEL
CHOLESTEROL: 211 mg/dL — AB (ref 0–200)
HDL: 45.2 mg/dL (ref >39.00)
LDL Cholesterol: 139 mg/dL — ABNORMAL HIGH (ref 0–99)
NonHDL: 165.9
TRIGLYCERIDES: 133 mg/dL (ref 0.0–149.0)
Total CHOL/HDL Ratio: 5
VLDL: 26.6 mg/dL (ref 0.0–40.0)

## 2018-03-19 LAB — HEPATIC FUNCTION PANEL
ALBUMIN: 4.7 g/dL (ref 3.5–5.2)
ALT: 18 U/L (ref 0–53)
AST: 19 U/L (ref 0–37)
Alkaline Phosphatase: 60 U/L (ref 39–117)
Bilirubin, Direct: 0.1 mg/dL (ref 0.0–0.3)
Total Bilirubin: 0.7 mg/dL (ref 0.2–1.2)
Total Protein: 6.8 g/dL (ref 6.0–8.3)

## 2018-03-19 LAB — URINALYSIS, ROUTINE W REFLEX MICROSCOPIC
Bilirubin Urine: NEGATIVE
HGB URINE DIPSTICK: NEGATIVE
Ketones, ur: NEGATIVE
LEUKOCYTES UA: NEGATIVE
NITRITE: NEGATIVE
RBC / HPF: NONE SEEN (ref 0–?)
Specific Gravity, Urine: 1.02 (ref 1.000–1.030)
Total Protein, Urine: NEGATIVE
URINE GLUCOSE: NEGATIVE
Urobilinogen, UA: 0.2 (ref 0.0–1.0)
pH: 7 (ref 5.0–8.0)

## 2018-03-19 LAB — TSH: TSH: 0.61 u[IU]/mL (ref 0.35–4.50)

## 2018-03-19 NOTE — Patient Instructions (Addendum)
Please complete lab work prior to leaving.   

## 2018-03-19 NOTE — Progress Notes (Signed)
Subjective:    Patient ID: Darren Scott, male    DOB: 04/29/1971, 47 y.o.   MRN: [id number]  HPI  Pt is a 47 yr old male who presents today for follow up.  HTN- he is maintained on amlodipine 10 mg.  He reports that he has had some intermittent ankle swelling.  Not bothersome enough to stop the medication. BP Readings from Last 3 Encounters:  03/19/18 (!) 133/92  02/20/18 132/90  02/11/18 (!) 141/102   Fatigue-notes recent increased fatigue.  Wonders if he may have low testosterone level.  Reports good libido.  Does have issues maintaining an erection.  Review of Systems    see HPI  Past Medical History:  Diagnosis Date  . Hyperlipidemia   . Hypertension      Social History   Socioeconomic History  . Marital status: Married    Spouse name: Not on file  . Number of children: Not on file  . Years of education: Not on file  . Highest education level: Not on file  Occupational History  . Not on file  Social Needs  . Financial resource strain: Not on file  . Food insecurity:    Worry: Not on file    Inability: Not on file  . Transportation needs:    Medical: Not on file    Non-medical: Not on file  Tobacco Use  . Smoking status: Never Smoker  . Smokeless tobacco: Current User    Types: Chew  . Tobacco comment: uses a can a day  Substance and Sexual Activity  . Alcohol use: Yes    Alcohol/week: 0.0 oz    Comment: occ  . Drug use: No  . Sexual activity: Not on file  Lifestyle  . Physical activity:    Days per week: Not on file    Minutes per session: Not on file  . Stress: Not on file  Relationships  . Social connections:    Talks on phone: Not on file    Gets together: Not on file    Attends religious service: Not on file    Active member of club or organization: Not on file    Attends meetings of clubs or organizations: Not on file    Relationship status: Not on file  . Intimate partner violence:    Fear of current or ex partner: Not on file   Emotionally abused: Not on file    Physically abused: Not on file    Forced sexual activity: Not on file  Other Topics Concern  . Not on file  Social History Narrative   3 children 65 yr old son, 2 yr old daughter, son age 53   Separated, has joint custody.  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 KNEE SURGERY Left 2004    Family History  Problem Relation Age of Onset  . Heart attack Father        died in his 8's, smoker  . Cancer Neg Hx   . Diabetes Neg Hx     No Known Allergies  Current Outpatient Medications on File Prior to Visit  Medication Sig Dispense Refill  . amLODipine (NORVASC) 10 MG tablet Take 1 tablet (10 mg total) by mouth daily. 30 tablet 3   No current facility-administered medications on file prior to visit.  BP (!) 133/92 (BP Location: Right Arm, Patient Position: Sitting, Cuff Size: Small)   Pulse 81   Temp 98.5 F (36.9 C) (Oral)   Resp 16   Ht 5\' 8"  (1.727 m)   Wt 153 lb 12.8 oz (69.8 kg)   SpO2 99%   BMI 23.39 kg/m    Objective:   Physical Exam  Constitutional: He is oriented to person, place, and time. He appears well-developed and well-nourished. No distress.  HENT:  Head: Normocephalic and atraumatic.  Cardiovascular: Normal rate and regular rhythm.  No murmur heard. Pulmonary/Chest: Effort normal and breath sounds normal. No respiratory distress. He has no wheezes. He has no rales.  Musculoskeletal: He exhibits no edema.  Neurological: He is alert and oriented to person, place, and time.  Skin: Skin is warm and dry.  Psychiatric: He has a normal mood and affect. His behavior is normal. Thought content normal.          Assessment & Plan:  HTN- Repeat bp 138/90, continue current dose of amlodipine.  Fatigue- new.  concerned about possibility of low testosterone.  Notes that he has some issues maintaining an erection.  States that he has no difficulty obtaining erection.  Reports that he has used some Viagra in the past.  Declines refill at this time.  He did not complete his physical labs last visit which included a thyroid and a CBC.  We will ask him to complete these labs today along with testosterone levels.

## 2018-03-20 LAB — TESTOSTERONE TOTAL,FREE,BIO, MALES
Albumin: 4.6 g/dL (ref 3.6–5.1)
Sex Hormone Binding: 20 nmol/L (ref 10–50)
Testosterone, Bioavailable: 160.9 ng/dL (ref >=110.0)
Testosterone, Free: 76.6 pg/mL (ref 46.0–224.0)
Testosterone: 403 ng/dL (ref 250–827)

## 2018-04-10 ENCOUNTER — Ambulatory Visit (HOSPITAL_BASED_OUTPATIENT_CLINIC_OR_DEPARTMENT_OTHER): Payer: 59 | Attending: Cardiology

## 2018-05-21 ENCOUNTER — Other Ambulatory Visit: Payer: Self-pay | Admitting: Family

## 2018-06-19 ENCOUNTER — Ambulatory Visit (INDEPENDENT_AMBULATORY_CARE_PROVIDER_SITE_OTHER): Payer: 59 | Admitting: Family

## 2018-06-19 ENCOUNTER — Encounter: Payer: Self-pay | Admitting: Family

## 2018-06-19 VITALS — BP 134/90 | HR 87 | Temp 98.1°F | Resp 16 | Ht 68.0 in | Wt 156.0 lb

## 2018-06-19 DIAGNOSIS — I10 Essential (primary) hypertension: Secondary | ICD-10-CM | POA: Diagnosis not present

## 2018-06-19 DIAGNOSIS — R5383 Other fatigue: Secondary | ICD-10-CM

## 2018-06-19 NOTE — Progress Notes (Signed)
Subjective:    Patient ID: Darren Scott, male    DOB: [birth year], 47 y.o.   MRN: [id number]  HPI  Patient is a 47 yr old male who presents today for follow up.   HTN- continues amlodipine.  Took dose this AM.  BP Readings from Last 3 Encounters:  06/19/18 (!) 143/91  03/19/18 138/90  02/20/18 132/90   Last visit he complained of fatigue. TSH and testosterone levels were WNL. Reports that he naps sometimes.  He reports that he still has some fatigue.  He reports that he sleeps on the couch.  He has a new mattress but does not like to sleep in his bed.  He falls asleep with the TV on the couch.  He does not have a designated bedtime.  Review of Systems See HPI  Past Medical History:  Diagnosis Date  . Hyperlipidemia   . Hypertension      Social History   Socioeconomic History  . Marital status: Married    Spouse name: Not on file  . Number of children: Not on file  . Years of education: Not on file  . Highest education level: Not on file  Occupational History  . Not on file  Social Needs  . Financial resource strain: Not on file  . Food insecurity:    Worry: Not on file    Inability: Not on file  . Transportation needs:    Medical: Not on file    Non-medical: Not on file  Tobacco Use  . Smoking status: Never Smoker  . Smokeless tobacco: Current User    Types: Chew  . Tobacco comment: uses a can a day  Substance and Sexual Activity  . Alcohol use: Yes    Alcohol/week: 0.0 standard drinks    Comment: occ  . Drug use: No  . Sexual activity: Not on file  Lifestyle  . Physical activity:    Days per week: Not on file    Minutes per session: Not on file  . Stress: Not on file  Relationships  . Social connections:    Talks on phone: Not on file    Gets together: Not on file    Attends religious service: Not on file    Active member of club or organization: Not on file    Attends meetings of clubs or organizations: Not on file    Relationship status: Not  on file  . Intimate partner violence:    Fear of current or ex partner: Not on file    Emotionally abused: Not on file    Physically abused: Not on file    Forced sexual activity: Not on file  Other Topics Concern  . Not on file  Social History Narrative   3 children 52 yr old son, 7 yr old daughter, son age 38   Separated, has joint custody.  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 KNEE SURGERY Left 2004    Family History  Problem Relation Age of Onset  . Heart attack Father        died in his 93's, smoker  . Cancer Neg Hx   . Diabetes Neg Hx     No Known Allergies  Current Outpatient Medications on File Prior to Visit  Medication Sig Dispense Refill  . amLODipine (NORVASC) 10  MG tablet TAKE 1 TABLET BY MOUTH EVERY DAY 90 tablet 1   No current facility-administered medications on file prior to visit.     BP (!) 143/91 (BP Location: Right Arm, Patient Position: Sitting, Cuff Size: Small)   Pulse 87   Temp 98.1 F (36.7 C) (Oral)   Resp 16   Ht 5\' 8"  (1.727 m)   Wt 156 lb (70.8 kg)   SpO2 99%   BMI 23.72 kg/m       Objective:   Physical Exam  Constitutional: He is oriented to person, place, and time. He appears well-developed and well-nourished. No distress.  HENT:  Head: Normocephalic and atraumatic.  Cardiovascular: Normal rate and regular rhythm.  No murmur heard. Pulmonary/Chest: Effort normal and breath sounds normal. No respiratory distress. He has no wheezes. He has no rales.  Musculoskeletal: He exhibits no edema.  Neurological: He is alert and oriented to person, place, and time.  Skin: Skin is warm and dry.  Psychiatric: He has a normal mood and affect. His behavior is normal. Thought content normal.          Assessment & Plan:  HTN- fair bp today.  Initial reading elevated but  follow-up blood pressure is improved.  He admits to dietary noncompliance with high sodium foods.  We discussed working on improving his diet.  Fatigue- he has poor sleep hygiene habits.  We discussed setting a bedtime in a week time each day.  Avoiding sleeping on the couch, sleeping in his bed.  He is resistant to my suggestions.   A total of 15  minutes were spent face-to-face with the patient during this encounter and over half of that time was spent on counseling and coordination of care. The patient was counseled on sleep hygiene.    Declines flu shot today.

## 2018-06-19 NOTE — Patient Instructions (Signed)
Please complete lab work prior to leaving.   

## 2018-09-20 NOTE — Progress Notes (Signed)
Subjective:    Patient ID: Darren Scott, male    DOB: 06/11/1971, 47 y.o.   MRN: [id number]  HPI  Patient is a 47 yr old male who presents today for follow up.   HTN- current medications include amlodipine 10 mg.  Reports that he stopped 1 week ago because he was concerned that it was contributing to his gerd symptoms.   Fatigue- last visit we discussed improving sleep hygiene habits.  Reports fatigue is unchanged and he has not changed his sleep habits.   GERD- reports that he took some over the counter tums and zantac which seems to help his symptoms.  Reports that if he lays down.    Review of Systems See HPI  Past Medical History:  Diagnosis Date  . Hyperlipidemia   . Hypertension      Social History   Socioeconomic History  . Marital status: Married    Spouse name: Not on file  . Number of children: Not on file  . Years of education: Not on file  . Highest education level: Not on file  Occupational History  . Not on file  Social Needs  . Financial resource strain: Not on file  . Food insecurity:    Worry: Not on file    Inability: Not on file  . Transportation needs:    Medical: Not on file    Non-medical: Not on file  Tobacco Use  . Smoking status: Never Smoker  . Smokeless tobacco: Current User    Types: Chew  . Tobacco comment: uses a can a day  Substance and Sexual Activity  . Alcohol use: Yes    Alcohol/week: 0.0 standard drinks    Comment: occ  . Drug use: No  . Sexual activity: Not on file  Lifestyle  . Physical activity:    Days per week: Not on file    Minutes per session: Not on file  . Stress: Not on file  Relationships  . Social connections:    Talks on phone: Not on file    Gets together: Not on file    Attends religious service: Not on file    Active member of club or organization: Not on file    Attends meetings of clubs or organizations: Not on file    Relationship status: Not on file  . Intimate partner violence:    Fear of  current or ex partner: Not on file    Emotionally abused: Not on file    Physically abused: Not on file    Forced sexual activity: Not on file  Other Topics Concern  . Not on file  Social History Narrative   3 children 47 yr old son, 47 yr old daughter, son age 46   Separated, has joint custody.  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 KNEE SURGERY Left 2004    Family History  Problem Relation Age of Onset  . Heart attack Father        died in his 3150's, smoker  . Cancer Neg Hx   . Diabetes Neg Hx     No Known Allergies  Current Outpatient Medications on File Prior to Visit  Medication Sig Dispense Refill  . amLODipine (NORVASC) 10 MG tablet TAKE 1 TABLET BY MOUTH EVERY DAY 90 tablet 1   No  current facility-administered medications on file prior to visit.     BP (!) 144/97 (BP Location: Left Arm, Patient Position: Sitting, Cuff Size: Small)   Pulse 83   Temp 98.3 F (36.8 C) (Oral)   Resp 16   Ht 5\' 8"  (1.727 m)   Wt 164 lb (74.4 kg)   SpO2 99%   BMI 24.94 kg/m       Objective:   Physical Exam Constitutional:      General: He is not in acute distress.    Appearance: He is well-developed.  HENT:     Head: Normocephalic and atraumatic.  Cardiovascular:     Rate and Rhythm: Normal rate and regular rhythm.     Heart sounds: No murmur.  Pulmonary:     Effort: Pulmonary effort is normal. No respiratory distress.     Breath sounds: Normal breath sounds. No wheezing or rales.  Skin:    General: Skin is warm and dry.  Neurological:     Mental Status: He is alert and oriented to person, place, and time.  Psychiatric:        Behavior: Behavior normal.        Thought Content: Thought content normal.           Assessment & Plan:  HTN- uncontrolled. Advised pt that I do not think that amlodipine is  cause for his gerd symptoms. Restart amlodipine.  GERD- uncontrolled.  Discussed gerd diet.  Rx with prilosec.    Fatigue- unchanged has not been willing thus far to work on sleep hygient.

## 2018-09-21 ENCOUNTER — Encounter: Payer: Self-pay | Admitting: Family

## 2018-09-21 ENCOUNTER — Ambulatory Visit: Payer: 59 | Admitting: Family

## 2018-09-21 VITALS — BP 144/97 | HR 83 | Temp 98.3°F | Resp 16 | Ht 68.0 in | Wt 164.0 lb

## 2018-09-21 DIAGNOSIS — R5383 Other fatigue: Secondary | ICD-10-CM | POA: Diagnosis not present

## 2018-09-21 DIAGNOSIS — K219 Gastro-esophageal reflux disease without esophagitis: Secondary | ICD-10-CM | POA: Diagnosis not present

## 2018-09-21 DIAGNOSIS — I10 Essential (primary) hypertension: Secondary | ICD-10-CM | POA: Diagnosis not present

## 2018-09-21 MED ORDER — OMEPRAZOLE 40 MG PO CPDR: 40.0000 mg | DELAYED_RELEASE_CAPSULE | Freq: Every day | ORAL | 3 refills | Status: DC

## 2018-09-21 NOTE — Patient Instructions (Addendum)
Please begin omeprazole once daily for heartburn and work on diet changes. Restart amlodipine once daily.    Food Choices for Gastroesophageal Reflux Disease, Adult When you have gastroesophageal reflux disease (GERD), the foods you eat and your eating habits are very important. Choosing the right foods can help ease your discomfort. Think about working with a nutrition specialist (dietitian) to help you make good choices. What are tips for following this plan?  Meals  Choose healthy foods that are low in fat, such as fruits, vegetables, whole grains, low-fat dairy products, and lean meat, fish, and poultry.  Eat small meals often instead of 3 large meals a day. Eat your meals slowly, and in a place where you are relaxed. Avoid bending over or lying down until 2-3 hours after eating.  Avoid eating meals 2-3 hours before bed.  Avoid drinking a lot of liquid with meals.  Cook foods using methods other than frying. Bake, grill, or broil food instead.  Avoid or limit: ? Chocolate. ? Peppermint or spearmint. ? Alcohol. ? Pepper. ? Black and decaffeinated coffee. ? Black and decaffeinated tea. ? Bubbly (carbonated) soft drinks. ? Caffeinated energy drinks and soft drinks.  Limit high-fat foods such as: ? Fatty meat or fried foods. ? Whole milk, cream, butter, or ice cream. ? Nuts and nut butters. ? Pastries, donuts, and sweets made with butter or shortening.  Avoid foods that cause symptoms. These foods may be different for everyone. Common foods that cause symptoms include: ? Tomatoes. ? Oranges, lemons, and limes. ? Peppers. ? Spicy food. ? Onions and garlic. ? Vinegar. Lifestyle  Maintain a healthy weight. Ask your doctor what weight is healthy for you. If you need to lose weight, work with your doctor to do so safely.  Exercise for at least 30 minutes for 5 or more days each week, or as told by your doctor.  Wear loose-fitting clothes.  Do not smoke. If you need help  quitting, ask your doctor.  Sleep with the head of your bed higher than your feet. Use a wedge under the mattress or blocks under the bed frame to raise the head of the bed. Summary  When you have gastroesophageal reflux disease (GERD), food and lifestyle choices are very important in easing your symptoms.  Eat small meals often instead of 3 large meals a day. Eat your meals slowly, and in a place where you are relaxed.  Limit high-fat foods such as fatty meat or fried foods.  Avoid bending over or lying down until 2-3 hours after eating.  Avoid peppermint and spearmint, caffeine, alcohol, and chocolate. This information is not intended to replace advice given to you by your health care provider. Make sure you discuss any questions you have with your health care provider. Document Released: 03/10/2012 Document Revised: 10/15/2016 Document Reviewed: 10/15/2016 Elsevier Interactive Patient Education  2019 [employer]

## 2018-11-02 ENCOUNTER — Ambulatory Visit: Payer: 59 | Admitting: Family

## 2018-11-02 ENCOUNTER — Encounter: Payer: Self-pay | Admitting: Family

## 2018-11-02 VITALS — BP 146/91 | HR 82 | Temp 98.3°F | Resp 16 | Ht 68.0 in | Wt 164.0 lb

## 2018-11-02 DIAGNOSIS — I10 Essential (primary) hypertension: Secondary | ICD-10-CM | POA: Diagnosis not present

## 2018-11-02 DIAGNOSIS — K219 Gastro-esophageal reflux disease without esophagitis: Secondary | ICD-10-CM | POA: Diagnosis not present

## 2018-11-02 DIAGNOSIS — M25511 Pain in right shoulder: Secondary | ICD-10-CM | POA: Diagnosis not present

## 2018-11-02 MED ORDER — MELOXICAM 7.5 MG PO TABS
7.5000 mg | ORAL_TABLET | Freq: Every day | ORAL | 0 refills | Status: DC
Start: 2018-11-02 — End: 2019-08-18

## 2018-11-02 MED ORDER — METOPROLOL SUCCINATE ER 25 MG PO TB24: 25.0000 mg | ORAL_TABLET | Freq: Every day | ORAL | 3 refills | Status: DC

## 2018-11-02 NOTE — Progress Notes (Signed)
Subjective:    Patient ID: Darren Scott, male    DOB: 12/31/1970, 48 y.o.   MRN: [id number]  HPI  Patient is a 48 yr old male who presents today for follow up.   HTN- reports good compliance with amlodipine.  BP Readings from Last 3 Encounters:  11/02/18 (!) 146/91  09/21/18 (!) 144/97  06/19/18 134/90   GERD-reports feeling much better on prilosec.  Reports he has improved his diet.    R shoulder pain-  Reports 3 week history of right shoulder pain.  Denies known injury.  Reports that it is improving.  He has not tried any otc meds.   Review of Systems    see HPI  Past Medical History:  Diagnosis Date  . Hyperlipidemia   . Hypertension      Social History   Socioeconomic History  . Marital status: Married    Spouse name: Not on file  . Number of children: Not on file  . Years of education: Not on file  . Highest education level: Not on file  Occupational History  . Not on file  Social Needs  . Financial resource strain: Not on file  . Food insecurity:    Worry: Not on file    Inability: Not on file  . Transportation needs:    Medical: Not on file    Non-medical: Not on file  Tobacco Use  . Smoking status: Never Smoker  . Smokeless tobacco: Current User    Types: Chew  . Tobacco comment: uses a can a day  Substance and Sexual Activity  . Alcohol use: Yes    Alcohol/week: 0.0 standard drinks    Comment: occ  . Drug use: No  . Sexual activity: Not on file  Lifestyle  . Physical activity:    Days per week: Not on file    Minutes per session: Not on file  . Stress: Not on file  Relationships  . Social connections:    Talks on phone: Not on file    Gets together: Not on file    Attends religious service: Not on file    Active member of club or organization: Not on file    Attends meetings of clubs or organizations: Not on file    Relationship status: Not on file  . Intimate partner violence:    Fear of current or ex partner: Not on file   Emotionally abused: Not on file    Physically abused: Not on file    Forced sexual activity: Not on file  Other Topics Concern  . Not on file  Social History Narrative   3 children 20 yr old son, 79 yr old daughter, son age 77   Separated, has joint custody.  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 KNEE SURGERY Left 2004    Family History  Problem Relation Age of Onset  . Heart attack Father        died in his 58's, smoker  . Cancer Neg Hx   . Diabetes Neg Hx     No Known Allergies  Current Outpatient Medications on File Prior to Visit  Medication Sig Dispense Refill  . amLODipine (NORVASC) 10 MG tablet TAKE 1 TABLET BY MOUTH EVERY DAY 90 tablet 1  . omeprazole (PRILOSEC) 40 MG capsule Take 1  capsule (40 mg total) by mouth daily. 30 capsule 3   No current facility-administered medications on file prior to visit.     BP (!) 146/91 (BP Location: Right Arm, Patient Position: Sitting, Cuff Size: Small)   Pulse 82   Temp 98.3 F (36.8 C) (Oral)   Resp 16   Ht 5\' 8"  (1.727 m)   Wt 164 lb (74.4 kg)   SpO2 100%   BMI 24.94 kg/m    Objective:   Physical Exam Constitutional:      General: He is not in acute distress.    Appearance: He is well-developed.  HENT:     Head: Normocephalic and atraumatic.  Cardiovascular:     Rate and Rhythm: Normal rate and regular rhythm.     Heart sounds: No murmur.  Pulmonary:     Effort: Pulmonary effort is normal. No respiratory distress.     Breath sounds: Normal breath sounds. No wheezing or rales.  Musculoskeletal:     Comments: Some tenderness overlying right superspinatous tendon.  Full ROM of shoulder  Skin:    General: Skin is warm and dry.  Neurological:     Mental Status: He is alert and oriented to person, place, and time.  Psychiatric:        Behavior:  Behavior normal.        Thought Content: Thought content normal.           Assessment & Plan:  Shoulder pain- we discussed referral to sports med but he declines due to high deductible. Will rx with meloxicam and he is advised to call if symptoms worsen or fail to improve.  HTN- uncontrolled. Will add toprol xl 25mg  once daily.  GERD- improved on omeprazole. Continue same.

## 2018-11-02 NOTE — Patient Instructions (Signed)
Please begin Toprol 25mg  once daily for shoulder pain. Call if shoulder pain worsens or if it does not improve with meloxicam.

## 2018-12-02 ENCOUNTER — Other Ambulatory Visit: Payer: Self-pay

## 2018-12-02 ENCOUNTER — Ambulatory Visit (INDEPENDENT_AMBULATORY_CARE_PROVIDER_SITE_OTHER): Payer: 59 | Admitting: Family

## 2018-12-02 DIAGNOSIS — I10 Essential (primary) hypertension: Secondary | ICD-10-CM | POA: Diagnosis not present

## 2018-12-02 NOTE — Progress Notes (Signed)
Patient here for blood pressure check per Dr. Peggyann Juba.  Last blood pressure was 146/91 at  11/02/18 office visit.  Toprol XL 25mg  was added.  He now takes Toprol XL 25mg  and Norvasc 10mg   Has taken medication today.  No missed doses since he started.  He also states that blood pressure at home has been running about the same as here in the office last 140s over 90s  Blood pressure:128/78 Pulse: 80  After getting blood pressure patient stated that he has been having a headache for about 2 weeks and that he thinks that it may be due to sinus issues.    Advised patient that we would need to see him and to make sure he takes blood pressures when he has the headaches.  Appointment made for Friday because he was unable to come back in today for later appointment and he will bring in his blood pressure readings.  Per [name] continue medications and she agreed with plan of headache.  She will discuss next appointment at that time.

## 2018-12-04 ENCOUNTER — Ambulatory Visit: Payer: 59 | Admitting: Family

## 2018-12-04 ENCOUNTER — Encounter: Payer: Self-pay | Admitting: Family

## 2018-12-04 ENCOUNTER — Other Ambulatory Visit: Payer: Self-pay

## 2018-12-04 VITALS — BP 145/95 | HR 82 | Temp 98.6°F | Resp 16 | Ht 68.0 in | Wt 163.4 lb

## 2018-12-04 DIAGNOSIS — Z8679 Personal history of other diseases of the circulatory system: Secondary | ICD-10-CM | POA: Diagnosis not present

## 2018-12-04 DIAGNOSIS — R51 Headache: Secondary | ICD-10-CM | POA: Diagnosis not present

## 2018-12-04 DIAGNOSIS — J329 Chronic sinusitis, unspecified: Secondary | ICD-10-CM | POA: Diagnosis not present

## 2018-12-04 DIAGNOSIS — R519 Headache, unspecified: Secondary | ICD-10-CM

## 2018-12-04 MED ORDER — AMOXICILLIN-POT CLAVULANATE 875-125 MG PO TABS: 1.0000 | ORAL_TABLET | Freq: Two times a day (BID) | ORAL | 0 refills | Status: DC

## 2018-12-04 MED ORDER — METOPROLOL SUCCINATE ER 50 MG PO TB24
50.0000 mg | ORAL_TABLET | Freq: Every day | ORAL | 3 refills | Status: DC
Start: 2018-12-04 — End: 2019-02-01

## 2018-12-04 NOTE — Patient Instructions (Signed)
Begin augmentin for sinus infection. Increase toprol xl from 25mg  to 50mg  once daily. Call if symptoms worsen or if symptoms are not improved in 3-4 days.

## 2018-12-04 NOTE — Progress Notes (Signed)
Subjective:    Patient ID: Darren Scott, male    DOB: 07-Jun-1971, 48 y.o.   MRN: [id number]  HPI  Patient is a 48 yr old male who presents today with chief complaint of HA.   BP on 12/02/18 was 128/78.  He reports that he had a sinus infection 1 month ago and took alka sezer day and night and "it all cleared up."  Reports he now has frontal sinus pressure.  Reports bloody nasal discharge in the AM.  Denies fever.    He is maintained on toprol xl 50mg  and amlodipine 10mg .   BP Readings from Last 3 Encounters:  12/04/18 (!) 140/92  11/02/18 (!) 146/91  09/21/18 (!) 144/97     Review of Systems See HPI  Past Medical History:  Diagnosis Date  . Hyperlipidemia   . Hypertension      Social History   Socioeconomic History  . Marital status: Married    Spouse name: Not on file  . Number of children: Not on file  . Years of education: Not on file  . Highest education level: Not on file  Occupational History  . Not on file  Social Needs  . Financial resource strain: Not on file  . Food insecurity:    Worry: Not on file    Inability: Not on file  . Transportation needs:    Medical: Not on file    Non-medical: Not on file  Tobacco Use  . Smoking status: Never Smoker  . Smokeless tobacco: Current User    Types: Chew  . Tobacco comment: uses a can a day  Substance and Sexual Activity  . Alcohol use: Yes    Alcohol/week: 0.0 standard drinks    Comment: occ  . Drug use: No  . Sexual activity: Not on file  Lifestyle  . Physical activity:    Days per week: Not on file    Minutes per session: Not on file  . Stress: Not on file  Relationships  . Social connections:    Talks on phone: Not on file    Gets together: Not on file    Attends religious service: Not on file    Active member of club or organization: Not on file    Attends meetings of clubs or organizations: Not on file    Relationship status: Not on file  . Intimate partner violence:    Fear of  current or ex partner: Not on file    Emotionally abused: Not on file    Physically abused: Not on file    Forced sexual activity: Not on file  Other Topics Concern  . Not on file  Social History Narrative   3 children 22 yr old son, 77 yr old daughter, son age 39   Separated, has joint custody.  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 KNEE SURGERY Left 2004    Family History  Problem Relation Age of Onset  . Heart attack Father        died in his 34's, smoker  . Cancer Neg Hx   . Diabetes Neg Hx     No Known Allergies  Current Outpatient Medications on File Prior to Visit  Medication Sig Dispense Refill  . amLODipine (NORVASC) 10 MG tablet TAKE 1 TABLET BY MOUTH EVERY DAY  90 tablet 1  . meloxicam (MOBIC) 7.5 MG tablet Take 1 tablet (7.5 mg total) by mouth daily. 14 tablet 0  . omeprazole (PRILOSEC) 40 MG capsule Take 1 capsule (40 mg total) by mouth daily. 30 capsule 3   No current facility-administered medications on file prior to visit.     BP (!) 140/92 (BP Location: Right Arm, Patient Position: Sitting, Cuff Size: Small)   Pulse 82   Temp 98.6 F (37 C) (Oral)   Resp 16   Ht 5\' 8"  (1.727 m)   Wt 163 lb 6.4 oz (74.1 kg)   SpO2 99%   BMI 24.84 kg/m      see HPI  Past Medical History:  Diagnosis Date  . Hyperlipidemia   . Hypertension      Social History   Socioeconomic History  . Marital status: Married    Spouse name: Not on file  . Number of children: Not on file  . Years of education: Not on file  . Highest education level: Not on file  Occupational History  . Not on file  Social Needs  . Financial resource strain: Not on file  . Food insecurity:    Worry: Not on file    Inability: Not on file  . Transportation needs:    Medical: Not on file    Non-medical: Not on file  Tobacco  Use  . Smoking status: Never Smoker  . Smokeless tobacco: Current User    Types: Chew  . Tobacco comment: uses a can a day  Substance and Sexual Activity  . Alcohol use: Yes    Alcohol/week: 0.0 standard drinks    Comment: occ  . Drug use: No  . Sexual activity: Not on file  Lifestyle  . Physical activity:    Days per week: Not on file    Minutes per session: Not on file  . Stress: Not on file  Relationships  . Social connections:    Talks on phone: Not on file    Gets together: Not on file    Attends religious service: Not on file    Active member of club or organization: Not on file    Attends meetings of clubs or organizations: Not on file    Relationship status: Not on file  . Intimate partner violence:    Fear of current or ex partner: Not on file    Emotionally abused: Not on file    Physically abused: Not on file    Forced sexual activity: Not on file  Other Topics Concern  . Not on file  Social History Narrative   3 children 44 yr old son, 21 yr old daughter, son age 16   Separated, has joint custody.  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 KNEE SURGERY Left 2004    Family History  Problem Relation Age of Onset  . Heart attack Father        died in his 27's, smoker  . Cancer Neg Hx   . Diabetes Neg Hx     No Known Allergies  Current Outpatient Medications on File Prior to Visit  Medication Sig Dispense Refill  . amLODipine (NORVASC) 10 MG tablet TAKE 1 TABLET BY MOUTH EVERY DAY 90 tablet 1  . meloxicam (MOBIC) 7.5 MG tablet Take 1 tablet (7.5  mg total) by mouth daily. 14 tablet 0  . omeprazole (PRILOSEC) 40 MG capsule Take 1 capsule (40 mg total) by mouth daily. 30 capsule 3   No current facility-administered medications on file prior to visit.     BP (!) 140/92 (BP Location: Right Arm,  Patient Position: Sitting, Cuff Size: Small)   Pulse 82   Temp 98.6 F (37 C) (Oral)   Resp 16   Ht  (1.727 m)   Wt 163 lb 6.4 oz (74.1 kg)   SpO2 99%   BMI 24.84 kg/m    Objective:   Physical Exam Constitutional:      General: He is not in acute distress.    Appearance: He is well-developed.  HENT:     Head: Normocephalic and atraumatic.     Nose:     Right Sinus: No maxillary sinus tenderness or frontal sinus tenderness.     Left Sinus: No maxillary sinus tenderness or frontal sinus tenderness.     Mouth/Throat:     Mouth: Mucous membranes are moist.     Pharynx: No pharyngeal swelling, oropharyngeal exudate, posterior oropharyngeal erythema or uvula swelling.  Cardiovascular:     Rate and Rhythm: Normal rate and regular rhythm.     Heart sounds: No murmur.  Pulmonary:     Effort: Pulmonary effort is normal. No respiratory distress.     Breath sounds: Normal breath sounds. No wheezing or rales.  Skin:    General: Skin is warm and dry.  Neurological:     Mental Status: He is alert and oriented to person, place, and time.  Psychiatric:        Behavior: Behavior normal.        Thought Content: Thought content normal.           Assessment & Plan:  HTN- uncontrolled.  Increase toprol xl from  to  once daily.  Sinusitis- rx with augmentin. Pt advised to avoid use of decongestants as this may increase BP.  HA- suspect related to uncontrolled BP and sinusitis. Hopefully will improve with above changes.

## 2018-12-14 ENCOUNTER — Other Ambulatory Visit: Payer: Self-pay | Admitting: Family

## 2018-12-28 ENCOUNTER — Other Ambulatory Visit: Payer: Self-pay | Admitting: Family

## 2018-12-30 ENCOUNTER — Other Ambulatory Visit: Payer: Self-pay

## 2018-12-30 ENCOUNTER — Ambulatory Visit (INDEPENDENT_AMBULATORY_CARE_PROVIDER_SITE_OTHER): Payer: 59 | Admitting: Family

## 2018-12-30 DIAGNOSIS — J329 Chronic sinusitis, unspecified: Secondary | ICD-10-CM

## 2018-12-30 DIAGNOSIS — I10 Essential (primary) hypertension: Secondary | ICD-10-CM | POA: Diagnosis not present

## 2018-12-30 DIAGNOSIS — K219 Gastro-esophageal reflux disease without esophagitis: Secondary | ICD-10-CM | POA: Diagnosis not present

## 2018-12-30 NOTE — Progress Notes (Signed)
Virtual Visit via Video Note  I connected with Darren Scott on 12/30/18 at  7:40 AM EDT by a video enabled telemedicine application and verified that I am speaking with the correct person using two identifiers. This visit type was conducted due to national recommendations for restrictions regarding the COVID-19 Pandemic (e.g. social distancing).  This format is felt to be most appropriate for this patient at this time.   I discussed the limitations of evaluation and management by telemedicine and the availability of in person appointments. The patient expressed understanding and agreed to proceed.  Only the patient and myself were on today's video visit. The patient was at work and I was in my office at the time of today's visit.   History of Present illness:  Sinusitis- last visit we treated him for sinusitis with augmentin. He reports resolution of symptoms following treatment. He also reports resolution of his HA's.   HTN- last visit bp was noted to be elevated. We increased his toprol xl from 25mg  to 50mg . We also continued amlodipine 10mg  once daily. He reports tolerating without difficulty.  GERD- reports that he ran out of omeprazole x 3 days and had recurrence of gerd symptoms.   Observations/Objective:   Gen: Awake, alert, no acute distress Resp: Breathing is even and non-labored Psych: calm/pleasant demeanor Neuro: Alert and Oriented x 3, + facial symmetry, speech is clear.  . Assessment and Plan: HTN- Tolerating increased dose of metoprolol. Advised pt to check his bp and pulse tonight and send me his readings via mychart for evaluation.   GERD- uncontrolled off of PPI, refill sent.  Sinusitis- resolved following treatment with augmentin. Pt notes some seasonal allergy symptoms which are improved if he wears a mask outside.    Follow Up Instructions:    I discussed the assessment and treatment plan with the patient. The patient was provided an opportunity to ask  questions and all were answered. The patient agreed with the plan and demonstrated an understanding of the instructions.   The patient was advised to call back or seek an in-person evaluation if the symptoms worsen or if the condition fails to improve as anticipated.    Lemont Fillers, NP

## 2018-12-31 ENCOUNTER — Encounter: Payer: Self-pay | Admitting: Family

## 2019-02-01 ENCOUNTER — Other Ambulatory Visit: Payer: Self-pay | Admitting: Family

## 2019-02-01 MED ORDER — METOPROLOL SUCCINATE ER 50 MG PO TB24: 50.0000 mg | ORAL_TABLET | Freq: Every day | ORAL | 1 refills | Status: DC

## 2019-02-02 ENCOUNTER — Encounter: Payer: Self-pay | Admitting: Family

## 2019-06-09 ENCOUNTER — Other Ambulatory Visit: Payer: Self-pay | Admitting: Family

## 2019-08-04 ENCOUNTER — Encounter: Payer: 59 | Admitting: Family

## 2019-08-09 ENCOUNTER — Other Ambulatory Visit: Payer: Self-pay

## 2019-08-10 ENCOUNTER — Encounter: Payer: Self-pay | Admitting: Family

## 2019-08-10 ENCOUNTER — Ambulatory Visit (INDEPENDENT_AMBULATORY_CARE_PROVIDER_SITE_OTHER): Payer: 59 | Admitting: Family

## 2019-08-10 VITALS — BP 134/83 | HR 70 | Temp 96.5°F | Resp 16 | Wt 161.0 lb

## 2019-08-10 DIAGNOSIS — Z0000 Encounter for general adult medical examination without abnormal findings: Secondary | ICD-10-CM

## 2019-08-10 NOTE — Progress Notes (Signed)
Subjective:    Patient ID: Darren Scott, male    DOB: 1971/09/04, 48 y.o.   MRN: [id number]  HPI  Patient is a 48 yr old male who presents today for cpx.  Immunizations: tdap up to date, declines flu shot Diet: tries to eat healthy Exercise:  Bough a treadmill and exercise bike, denies chest pain or shortness of breath Vision:  Due- pt will schedule.  He is going to enroll for vision Dental: up to date Wt Readings from Last 3 Encounters:  08/10/19 161 lb (73 kg)  12/04/18 163 lb 6.4 oz (74.1 kg)  11/02/18 164 lb (74.4 kg)       Review of Systems  Constitutional: Negative for unexpected weight change.  HENT: Negative for hearing loss and rhinorrhea.   Eyes: Negative for visual disturbance.  Respiratory: Negative for cough and shortness of breath.   Cardiovascular: Negative for chest pain.  Gastrointestinal: Negative for blood in stool, constipation and diarrhea.  Genitourinary: Negative for dysuria, frequency and hematuria.  Musculoskeletal: Positive for arthralgias (chronic bilateral knee pain).  Skin: Negative for rash.  Neurological: Positive for headaches (head HA this AM, resolved with bc powder).  Hematological: Negative for adenopathy.  Psychiatric/Behavioral:       Denies depression/anxiety   Past Medical History:  Diagnosis Date  . Hyperlipidemia   . Hypertension      Social History   Socioeconomic History  . Marital status: Married    Spouse name: Not on file  . Number of children: Not on file  . Years of education: Not on file  . Highest education level: Not on file  Occupational History  . Not on file  Social Needs  . Financial resource strain: Not on file  . Food insecurity    Worry: Not on file    Inability: Not on file  . Transportation needs    Medical: Not on file    Non-medical: Not on file  Tobacco Use  . Smoking status: Never Smoker  . Smokeless tobacco: Current User    Types: Chew  . Tobacco comment: uses a can a day   Substance and Sexual Activity  . Alcohol use: Yes    Alcohol/week: 0.0 standard drinks    Comment: occ  . Drug use: No  . Sexual activity: Not on file  Lifestyle  . Physical activity    Days per week: Not on file    Minutes per session: Not on file  . Stress: Not on file  Relationships  . Social Herbalist on phone: Not on file    Gets together: Not on file    Attends religious service: Not on file    Active member of club or organization: Not on file    Attends meetings of clubs or organizations: Not on file    Relationship status: Not on file  . Intimate partner violence    Fear of current or ex partner: Not on file    Emotionally abused: Not on file    Physically abused: Not on file    Forced sexual activity: Not on file  Other Topics Concern  . Not on file  Social History Narrative   3 children 46 yr old son, 93 yr old daughter, son age 6   Separated, has joint custody.  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 KNEE SURGERY Left 2004    Family History  Problem Relation Age of Onset  . Heart attack Father        died in his 21's, smoker  . Cancer Neg Hx   . Diabetes Neg Hx     No Known Allergies  Current Outpatient Medications on File Prior to Visit  Medication Sig Dispense Refill  . amLODipine (NORVASC) 10 MG tablet TAKE 1 TABLET BY MOUTH EVERY DAY 90 tablet 1  . meloxicam (MOBIC) 7.5 MG tablet Take 1 tablet (7.5 mg total) by mouth daily. 14 tablet 0  . metoprolol succinate (TOPROL-XL) 50 MG 24 hr tablet Take 1 tablet (50 mg total) by mouth daily. Take with or immediately following a meal. 90 tablet 1   No current facility-administered medications on file prior to visit.     BP 134/83 (BP Location: Right Arm, Patient Position: Sitting, Cuff Size: Small)   Pulse 70   Temp (!) 96.5 F  (35.8 C) (Temporal)   Resp 16   Wt 161 lb (73 kg)   SpO2 99%   BMI 24.48 kg/m       Objective:   Physical Exam  Physical Exam  Constitutional: He is oriented to person, place, and time. He appears well-developed and well-nourished. No distress.  HENT:  Head: Normocephalic and atraumatic.  Right Ear: Tympanic membrane and ear canal normal.  Left Ear: Tympanic membrane and ear canal normal.  Mouth/Throat: Oropharynx is clear and moist.  Eyes: Pupils are equal, round, and reactive to light. No scleral icterus.  Neck: Normal range of motion. No thyromegaly present.  Cardiovascular: Normal rate and regular rhythm.   No murmur heard. Pulmonary/Chest: Effort normal and breath sounds normal. No respiratory distress. He has no wheezes. He has no rales. He exhibits no tenderness.  Abdominal: Soft. Bowel sounds are normal. He exhibits no distension and no mass. There is no tenderness. There is no rebound and no guarding.  Musculoskeletal: He exhibits no edema.  Lymphadenopathy:    He has no cervical adenopathy.  Neurological: He is alert and oriented to person, place, and time. He has normal patellar reflexes. He exhibits normal muscle tone. Coordination normal.  Skin: Skin is warm and dry.  Psychiatric: He has a normal mood and affect. His behavior is normal. Judgment and thought content normal.           Assessment & Plan:   Preventative care- declines flu shot, tetanus is up to date.  Will obtain routine lab work.  Pt would like to begin an exercise regimen. Will refer to cardiology for cardiac evaluation and clearance for exercise program.       Assessment & Plan:

## 2019-08-10 NOTE — Patient Instructions (Signed)
Please complete lab work prior to leaving. We will be in touch about scheduling your exercise stress test.

## 2019-08-11 ENCOUNTER — Telehealth: Payer: Self-pay | Admitting: Family

## 2019-08-11 DIAGNOSIS — Z0000 Encounter for general adult medical examination without abnormal findings: Secondary | ICD-10-CM

## 2019-08-11 LAB — TSH: TSH: 0.89 u[IU]/mL (ref 0.35–4.50)

## 2019-08-11 LAB — LIPID PANEL
Cholesterol: 219 mg/dL — ABNORMAL HIGH (ref 0–200)
HDL: 38.1 mg/dL — ABNORMAL LOW (ref >39.00)
NonHDL: 180.77
Total CHOL/HDL Ratio: 6
Triglycerides: 272 mg/dL — ABNORMAL HIGH (ref 0.0–149.0)
VLDL: 54.4 mg/dL — ABNORMAL HIGH (ref 0.0–40.0)

## 2019-08-11 LAB — CBC WITH DIFFERENTIAL/PLATELET
Basophils Absolute: 0.1 10*3/uL (ref 0.0–0.1)
Basophils Relative: 1 % (ref 0.0–3.0)
Eosinophils Absolute: 0.1 10*3/uL (ref 0.0–0.7)
Eosinophils Relative: 1 % (ref 0.0–5.0)
HCT: 42.9 % (ref 39.0–52.0)
Hemoglobin: 14.6 g/dL (ref 13.0–17.0)
Lymphocytes Relative: 40.4 % (ref 12.0–46.0)
Lymphs Abs: 3 10*3/uL (ref 0.7–4.0)
MCHC: 34.1 g/dL (ref 30.0–36.0)
MCV: 91.7 fl (ref 78.0–100.0)
Monocytes Absolute: 0.7 10*3/uL (ref 0.1–1.0)
Monocytes Relative: 8.9 % (ref 3.0–12.0)
Neutro Abs: 3.6 10*3/uL (ref 1.4–7.7)
Neutrophils Relative %: 48.7 % (ref 43.0–77.0)
Platelets: 230 10*3/uL (ref 150.0–400.0)
RBC: 4.67 Mil/uL (ref 4.22–5.81)
RDW: 12.6 % (ref 11.5–15.5)
WBC: 7.3 10*3/uL (ref 4.0–10.5)

## 2019-08-11 LAB — HEPATIC FUNCTION PANEL
ALT: 25 U/L (ref 0–53)
AST: 22 U/L (ref 0–37)
Albumin: 4.6 g/dL (ref 3.5–5.2)
Alkaline Phosphatase: 69 U/L (ref 39–117)
Bilirubin, Direct: 0.1 mg/dL (ref 0.0–0.3)
Total Bilirubin: 0.4 mg/dL (ref 0.2–1.2)
Total Protein: 6.9 g/dL (ref 6.0–8.3)

## 2019-08-11 LAB — BASIC METABOLIC PANEL
BUN: 19 mg/dL (ref 6–23)
CO2: 28 mEq/L (ref 19–32)
Calcium: 9.6 mg/dL (ref 8.4–10.5)
Chloride: 98 mEq/L (ref 96–112)
Creatinine, Ser: 1.16 mg/dL (ref 0.40–1.50)
GFR: 67 mL/min (ref >60.00)
Glucose, Bld: 89 mg/dL (ref 70–99)
Potassium: 4.1 mEq/L (ref 3.5–5.1)
Sodium: 136 mEq/L (ref 135–145)

## 2019-08-11 LAB — LDL CHOLESTEROL, DIRECT: Direct LDL: 124 mg/dL

## 2019-08-11 NOTE — Telephone Encounter (Signed)
Please advise pt that they are not currently doing the exercise treadmill tests due to covid-19.  I would recommend that we refer him to cardiology for consultation and they will order additional testing if they feel that it is indicated.

## 2019-08-13 ENCOUNTER — Encounter: Payer: 59 | Admitting: Family

## 2019-08-16 NOTE — Telephone Encounter (Signed)
Patient has cardiology consult scheduled for 08/18/2019 for cardiac evaluation prior to starting exercise regimen.

## 2019-08-18 ENCOUNTER — Ambulatory Visit (INDEPENDENT_AMBULATORY_CARE_PROVIDER_SITE_OTHER): Payer: 59 | Admitting: Cardiology

## 2019-08-18 ENCOUNTER — Other Ambulatory Visit: Payer: Self-pay

## 2019-08-18 ENCOUNTER — Encounter: Payer: Self-pay | Admitting: Cardiology

## 2019-08-18 VITALS — BP 120/102 | HR 72 | Ht 68.0 in | Wt 161.0 lb

## 2019-08-18 DIAGNOSIS — I10 Essential (primary) hypertension: Secondary | ICD-10-CM | POA: Diagnosis not present

## 2019-08-18 DIAGNOSIS — R079 Chest pain, unspecified: Secondary | ICD-10-CM | POA: Diagnosis not present

## 2019-08-18 DIAGNOSIS — Z01812 Encounter for preprocedural laboratory examination: Secondary | ICD-10-CM

## 2019-08-18 MED ORDER — ATORVASTATIN CALCIUM 20 MG PO TABS: 20.0000 mg | ORAL_TABLET | Freq: Every day | ORAL | 1 refills | Status: DC

## 2019-08-18 MED ORDER — CARVEDILOL 3.125 MG PO TABS: 3.1250 mg | ORAL_TABLET | Freq: Two times a day (BID) | ORAL | 1 refills | Status: DC

## 2019-08-18 NOTE — Patient Instructions (Signed)
Medication Instructions:  Your physician has recommended you make the following change in your medication:   STOP: Toprol(metoprolol succinate)  START: Lipitor(atorvastatin) 20 mg Take 1 tab daily START: Coreg (carvedilol) 3.125 mg Take 1 tab twice daily  *If you need a refill on your cardiac medications before your next appointment, please call your pharmacy*  Lab Work: Your physician recommends that you return for lab work in:   3-7 days prior to CT appointment: BMP  If you have labs (blood work) drawn today and your tests are completely normal, you will receive your results only by: Marland Kitchen MyChart Message (if you have MyChart) OR . A paper copy in the mail If you have any lab test that is abnormal or we need to change your treatment, we will call you to review the results.  Testing/Procedures: Your physician has requested that you have an echocardiogram. Echocardiography is a painless test that uses sound waves to create images of your heart. It provides your doctor with information about the size and shape of your heart and how well your heart's chambers and valves are working. This procedure takes approximately one hour. There are no restrictions for this procedure.  Your physician has requested that you have cardiac CT. Cardiac computed tomography (CT) is a painless test that uses an x-ray machine to take clear, detailed pictures of your heart. For further information please visit [email or web address] Please follow instruction sheet as given.  Your cardiac CT will be scheduled at one of the below locations:   [facility] [address] [place], [place] [address] [phone]   If scheduled at [facility], please arrive at the [facility] main entrance of [facility] 30-45 minutes prior to test start time. Proceed to the [facility] Radiology Department (first floor) to check-in and test prep.    Please follow these instructions carefully (unless  otherwise directed):  Hold all erectile dysfunction medications at least 3 days (72 hrs) prior to test.  On the Night Before the Test: . Be sure to Drink plenty of water. . Do not consume any caffeinated/decaffeinated beverages or chocolate 12 hours prior to your test. . Do not take any antihistamines 12 hours prior to your test.  On the Day of the Test: . Drink plenty of water. Do not drink any water within one hour of the test. . Do not eat any food 4 hours prior to the test. . You may take your regular medications prior to the test.  . Take carvedilol (Coreg) two hours prior to test..                   -If HR is less than 55 BPM- No Beta Blocker(carvedilol)                -IF HR is greater than 55 BPM and patient is less than or equal to 75 yrs old Carvedilol 3.125 mg x1.                    After the Test: . Drink plenty of water. . After receiving IV contrast, you may experience a mild flushed feeling. This is normal. . On occasion, you may experience a mild rash up to 24 hours after the test. This is not dangerous. If this occurs, you can take Benadryl 25 mg and increase your fluid intake. . If you experience trouble breathing, this can be serious. If it is severe call [phone] IMMEDIATELY. If it is  mild, please call our office.  Once we have confirmed authorization from your insurance company, we will call you to set up a date and time for your test.   For non-scheduling related questions, please contact the cardiac imaging nurse navigator should you have any questions/concerns: Marchia Bond, RN Navigator Cardiac Imaging [facility] [phone] Office    Follow-Up: At [facility], you and your health needs are our priority.  As part of our continuing mission to provide you with exceptional heart care, we have created designated Provider Care Teams.  These Care Teams include your primary Cardiologist (physician) and Advanced Practice Providers (APPs -   Physician Assistants and Nurse Practitioners) who all work together to provide you with the care you need, when you need it.  Your next appointment:   3 month(s)  The format for your next appointment:   In Person  Provider:   Berniece Salines, DO  Other Instructions

## 2019-08-18 NOTE — Progress Notes (Signed)
Cardiology Office Note:    Date:  08/18/2019   ID:  Darren Scott, DOB Feb 22, 1971, MRN [id number]  PCP:  [name], NP  Cardiologist:  [name], DO  Electrophysiologist:  None   Referring MD: [name], NP   History of Present Illness:    Darren Scott is a 48 y.o. male with a hx of hypertension, Hyperlipidemia and family history of premature coronary artery disease who presents to establish cardiac care.  The patient tells me that he wants to get more involved in exercising and wanted to see a cardiology as he has had long-term hypertension.  During our encounter he was able to share with me that he had been experiencing intermittent left-sided chest pain.  He described these as tingling/squeezing pain that last for few minutes at a time.  He notes that his pain is intermittent.  He denies any associated shortness of breath, nausea, vomiting, or dizziness.  Past Medical History:  Diagnosis Date  . Hyperlipidemia   . Hypertension     Past Surgical History:  Procedure Laterality Date  . KNEE SURGERY Left 2004    Current Medications: Current Meds  Medication Sig  . amLODipine (NORVASC) 10 MG tablet TAKE 1 TABLET BY MOUTH EVERY DAY  . [DISCONTINUED] metoprolol succinate (TOPROL-XL) 50 MG 24 hr tablet Take 1 tablet (50 mg total) by mouth daily. Take with or immediately following a meal.     Allergies:   Patient has no known allergies.   Social History   Socioeconomic History  . Marital status: Married    Spouse name: Not on file  . Number of children: Not on file  . Years of education: Not on file  . Highest education level: Not on file  Occupational History  . Not on file  Social Needs  . Financial resource strain: Not on file  . Food insecurity    Worry: Not on file    Inability: Not on file  . Transportation needs    Medical: Not on file    Non-medical: Not on file  Tobacco Use  . Smoking status: Never Smoker  . Smokeless tobacco:  Current User    Types: Chew  . Tobacco comment: uses a can a day  Substance and Sexual Activity  . Alcohol use: Yes    Alcohol/week: 0.0 standard drinks    Comment: occ  . Drug use: No  . Sexual activity: Not on file  Lifestyle  . Physical activity    Days per week: Not on file    Minutes per session: Not on file  . Stress: Not on file  Relationships  . Social Musician on phone: Not on file    Gets together: Not on file    Attends religious service: Not on file    Active member of club or organization: Not on file    Attends meetings of clubs or organizations: Not on file    Relationship status: Not on file  Other Topics Concern  . Not on file  Social History Narrative   3 children 22 yr old son, 53 yr old daughter, son age 39   Separated, has joint custody.  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 There is no history of Cancer or Diabetes.  ROS:  Review of Systems  Constitution: Negative for decreased appetite, fever and weight gain.  HENT: Negative for congestion, ear discharge, hoarse voice and sore throat.   Eyes: Negative for discharge, redness, vision loss in right eye and visual halos.  Cardiovascular: Negative for chest pain, dyspnea on exertion, leg swelling, orthopnea and palpitations.  Respiratory: Negative for cough, hemoptysis, shortness of breath and snoring.   Endocrine: Negative for heat intolerance and polyphagia.  Hematologic/Lymphatic: Negative for bleeding problem. Does not bruise/bleed easily.  Skin: Negative for flushing, nail changes, rash and suspicious lesions.  Musculoskeletal: Negative for arthritis, joint pain, muscle cramps, myalgias, neck pain and stiffness.  Gastrointestinal: Negative for abdominal pain, bowel incontinence, diarrhea and  excessive appetite.  Genitourinary: Negative for decreased libido, genital sores and incomplete emptying.  Neurological: Negative for brief paralysis, focal weakness, headaches and loss of balance.  Psychiatric/Behavioral: Negative for altered mental status, depression and suicidal ideas.  Allergic/Immunologic: Negative for HIV exposure and persistent infections.    EKGs/Labs/Other Studies Reviewed:    The following studies were reviewed today:   EKG:  The ekg ordered today demonstrates sinus rhythm, heart rate 82 bpm compared to EKG done on Feb 11, 2018 no significant change.  Recent Labs: 08/10/2019: ALT 25; BUN 19; Creatinine, Ser 1.16; Hemoglobin 14.6; Platelets 230.0; Potassium 4.1; Sodium 136; TSH 0.89  Recent Lipid Panel    Component Value Date/Time   CHOL 219 (H) 08/10/2019 1531   TRIG 272.0 (H) 08/10/2019 1531   HDL 38.10 (L) 08/10/2019 1531   CHOLHDL 6 08/10/2019 1531   VLDL 54.4 (H) 08/10/2019 1531   LDLCALC 139 (H) 03/19/2018 1003   LDLDIRECT 124.0 08/10/2019 1531    Physical Exam:    VS:  BP (!) 120/102 (BP Location: Left Arm, Patient Position: Sitting, Cuff Size: Normal)   Pulse 72   Ht  (1.727 m)   Wt 161 lb (73 kg)   SpO2 97%   BMI 24.48 kg/m     Wt Readings from Last 3 Encounters:  08/18/19 161 lb (73 kg)  08/10/19 161 lb (73 kg)  12/04/18 163 lb 6.4 oz (74.1 kg)     GEN: Well nourished, well developed in no acute distress HEENT: Normal NECK: No JVD; No carotid bruits LYMPHATICS: No lymphadenopathy CARDIAC: S1S2 noted,RRR, no murmurs, rubs, gallops RESPIRATORY:  Clear to auscultation without rales, wheezing or rhonchi  ABDOMEN: Soft, non-tender, non-distended, +bowel sounds, no guarding. EXTREMITIES: No edema, No cyanosis, no clubbing MUSCULOSKELETAL:  No edema; No deformity  SKIN: Warm and dry NEUROLOGIC:  Alert and oriented x 3, non-focal PSYCHIATRIC:  Normal affect, good insight  ASSESSMENT:    1. Pre-procedure lab exam   2. Chest  pain, unspecified type   3. Hypertension, unspecified type    PLAN:    1.  Chest pain is concerning given his risk factors therefore at this time I am going to pursue an ischemic evaluation in this patient.  A CTA coronaries will be ordered.  The patient does not need to any IV contrast allergies.  He was educated on this test and he is agreeable to proceed.  2.  Hyperlipidemia-his most recent lipid profile reports a LDL of 121, HDL 38, total cholesterol 528, triglyceride 272. He tells me that he has been actively watching what he eats because he has had high LDL in the past.  Discussed with the patient, start him on moderate intensity statin and Lipitor 20 mg daily.  3.  Hypertension-he is currently on amlodipine 10 mg  daily with Toprol XL 50 mg daily he does have diastolic hypertension at 90 mmHg.  I am going to switch his Toprol XL to carvedilol 3.125 twice daily.  I am hoping to get blood pressure response from this.  In addition in the setting of his longstanding hypertension transthoracic echocardiogram is appropriate to assess RV/LV function and any structural abnormalities.  The patient is in agreement with the above plan. The patient left the office in stable condition.  The patient will follow up in 3 months or sooner if needed.   Medication Adjustments/Labs and Tests Ordered: Current medicines are reviewed at length with the patient today.  Concerns regarding medicines are outlined above.  Orders Placed This Encounter  Procedures  . CT CORONARY FRACTIONAL FLOW RESERVE DATA PREP  . CT CORONARY FRACTIONAL FLOW RESERVE FLUID ANALYSIS  . CT CORONARY MORPH W/CTA COR W/SCORE W/CA W/CM &/OR WO/CM  . Basic Metabolic Panel (BMET)  . EKG 12-Lead  . ECHOCARDIOGRAM COMPLETE   Meds ordered this encounter  Medications  . carvedilol (COREG) 3.125 MG tablet    Sig: Take 1 tablet (3.125 mg total) by mouth 2 (two) times daily.    Dispense:  180 tablet    Refill:  1  . atorvastatin (LIPITOR)  20 MG tablet    Sig: Take 1 tablet (20 mg total) by mouth daily.    Dispense:  90 tablet    Refill:  1    Patient Instructions  Medication Instructions:  Your physician has recommended you make the following change in your medication:   STOP: Toprol(metoprolol succinate)  START: Lipitor(atorvastatin) 20 mg Take 1 tab daily START: Coreg (carvedilol) 3.125 mg Take 1 tab twice daily  *If you need a refill on your cardiac medications before your next appointment, please call your pharmacy*  Lab Work: Your physician recommends that you return for lab work in:   3-7 days prior to CT appointment: BMP  If you have labs (blood work) drawn today and your tests are completely normal, you will receive your results only by: Marland Kitchen MyChart Message (if you have MyChart) OR . A paper copy in the mail If you have any lab test that is abnormal or we need to change your treatment, we will call you to review the results.  Testing/Procedures: Your physician has requested that you have an echocardiogram. Echocardiography is a painless test that uses sound waves to create images of your heart. It provides your doctor with information about the size and shape of your heart and how well your heart's chambers and valves are working. This procedure takes approximately one hour. There are no restrictions for this procedure.  Your physician has requested that you have cardiac CT. Cardiac computed tomography (CT) is a painless test that uses an x-ray machine to take clear, detailed pictures of your heart. For further information please visit [email or web address]. Please follow instruction sheet as given.  Your cardiac CT will be scheduled at one of the below locations:   [facility] [address] [place], [place] [address] [phone]   If scheduled at [facility], please arrive at the [facility] main entrance of [facility] 30-45 minutes prior to test start time. Proceed to the  [facility] Radiology Department (first floor) to check-in and test prep.    Please follow these instructions carefully (unless otherwise directed):  Hold all erectile dysfunction medications at least 3 days (72 hrs) prior to test.  On the Night Before the Test: .  Be sure to Drink plenty of water. . Do not consume any caffeinated/decaffeinated beverages or chocolate 12 hours prior to your test. . Do not take any antihistamines 12 hours prior to your test.  On the Day of the Test: . Drink plenty of water. Do not drink any water within one hour of the test. . Do not eat any food 4 hours prior to the test. . You may take your regular medications prior to the test.  . Take carvedilol (Coreg) two hours prior to test..                   -If HR is less than 55 BPM- No Beta Blocker(carvedilol)                -IF HR is greater than 55 BPM and patient is less than or equal to 75 yrs old Carvedilol 3.125 mg x1.                    After the Test: . Drink plenty of water. . After receiving IV contrast, you may experience a mild flushed feeling. This is normal. . On occasion, you may experience a mild rash up to 24 hours after the test. This is not dangerous. If this occurs, you can take Benadryl 25 mg and increase your fluid intake. . If you experience trouble breathing, this can be serious. If it is severe call [phone] IMMEDIATELY. If it is mild, please call our office.  Once we have confirmed authorization from your insurance company, we will call you to set up a date and time for your test.   For non-scheduling related questions, please contact the cardiac imaging nurse navigator should you have any questions/concerns: Rockwell AlexandriaSara Wallace, RN Navigator Cardiac Imaging [facility] [phone] Office    Follow-Up: At [facility], you and your health needs are our priority.  As part of our continuing mission to provide you with exceptional heart care, we have created  designated Provider Care Teams.  These Care Teams include your primary Cardiologist (physician) and Advanced Practice Providers (APPs -  Physician Assistants and Nurse Practitioners) who all work together to provide you with the care you need, when you need it.  Your next appointment:   3 month(s)  The format for your next appointment:   In Person  Provider:   Thomasene RippleKardie Saaya Procell, DO  Other Instructions      Adopting a Healthy Lifestyle.  Know what a healthy weight is for you (roughly BMI <25) and aim to maintain this   Aim for 7+ servings of fruits and vegetables daily   65-80+ fluid ounces of water or unsweet tea for healthy kidneys   Limit to max 1 drink of alcohol per day; avoid smoking/tobacco   Limit animal fats in diet for cholesterol and heart health - choose grass fed whenever available   Avoid highly processed foods, and foods high in saturated/trans fats   Aim for low stress - take time to unwind and care for your mental health   Aim for 150 min of moderate intensity exercise weekly for heart health, and weights twice weekly for bone health   Aim for 7-9 hours of sleep daily   When it comes to diets, agreement about the perfect plan isnt easy to find, even among the experts. Experts at the [employer] developed an idea known as the Healthy Eating Plate. Just imagine a plate divided into logical, healthy  portions.   The emphasis is on diet quality:   Load up on vegetables and fruits - one-half of your plate: Aim for color and variety, and remember that potatoes dont count.   Go for whole grains - one-quarter of your plate: Whole wheat, barley, wheat berries, quinoa, oats, brown rice, and foods made with them. If you want pasta, go with whole wheat pasta.   Protein power - one-quarter of your plate: Fish, chicken, beans, and nuts are all healthy, versatile protein sources. Limit red meat.   The diet, however, does go beyond the plate, offering a  few other suggestions.   Use healthy plant oils, such as olive, canola, soy, corn, sunflower and peanut. Check the labels, and avoid partially hydrogenated oil, which have unhealthy trans fats.   If youre thirsty, drink water. Coffee and tea are good in moderation, but skip sugary drinks and limit milk and dairy products to one or two daily servings.   The type of carbohydrate in the diet is more important than the amount. Some sources of carbohydrates, such as vegetables, fruits, whole grains, and beans-are healthier than others.   Finally, stay active  Signed, [name], DO  08/18/2019 9:32 PM    [facility]

## 2019-08-24 ENCOUNTER — Ambulatory Visit (HOSPITAL_BASED_OUTPATIENT_CLINIC_OR_DEPARTMENT_OTHER)
Admission: RE | Admit: 2019-08-24 | Discharge: 2019-08-24 | Disposition: A | Discharge status: Home / Self Care | Payer: 59 | Source: Ambulatory Visit | Attending: Cardiology | Admitting: Cardiology

## 2019-08-24 DIAGNOSIS — R079 Chest pain, unspecified: Secondary | ICD-10-CM

## 2019-08-24 DIAGNOSIS — I10 Essential (primary) hypertension: Secondary | ICD-10-CM

## 2019-08-24 NOTE — Progress Notes (Signed)
  Echocardiogram 2D Echocardiogram has been performed.  Darren Scott 08/24/2019, 2:38 PM

## 2019-10-08 ENCOUNTER — Other Ambulatory Visit: Payer: Self-pay | Admitting: Family

## 2019-10-08 ENCOUNTER — Encounter (HOSPITAL_COMMUNITY): Payer: Self-pay

## 2019-10-08 LAB — BASIC METABOLIC PANEL
BUN/Creatinine Ratio: 14 (ref 9–20)
BUN: 15 mg/dL (ref 6–24)
CO2: 22 mmol/L (ref 20–29)
Calcium: 9.7 mg/dL (ref 8.7–10.2)
Chloride: 101 mmol/L (ref 96–106)
Creatinine, Ser: 1.1 mg/dL (ref 0.76–1.27)
GFR calc Af Amer: 91 mL/min/{1.73_m2} (ref >59)
GFR calc non Af Amer: 79 mL/min/{1.73_m2} (ref >59)
Glucose: 86 mg/dL (ref 65–99)
Potassium: 3.9 mmol/L (ref 3.5–5.2)
Sodium: 139 mmol/L (ref 134–144)

## 2019-10-11 ENCOUNTER — Telehealth (HOSPITAL_COMMUNITY): Payer: Self-pay | Admitting: Emergency Medicine

## 2019-10-11 NOTE — Telephone Encounter (Signed)
Left message on voicemail with name and callback number Cleophus Mendonsa RN Navigator Cardiac Imaging [facility] [phone] Office [phone] Cell  

## 2019-10-12 ENCOUNTER — Ambulatory Visit (HOSPITAL_COMMUNITY): Admission: RE | Admit: 2019-10-12 | Payer: Managed Care, Other (non HMO) | Source: Ambulatory Visit

## 2019-10-28 ENCOUNTER — Ambulatory Visit: Payer: 59 | Admitting: Cardiology

## 2019-11-15 ENCOUNTER — Encounter (HOSPITAL_COMMUNITY): Payer: Self-pay

## 2019-11-15 ENCOUNTER — Telehealth (HOSPITAL_COMMUNITY): Payer: Self-pay | Admitting: Emergency Medicine

## 2019-11-15 NOTE — Telephone Encounter (Signed)
Reaching out to patient to offer assistance regarding upcoming cardiac imaging study; pt verbalizes understanding of appt date/time, parking situation and where to check in, pre-test NPO status and medications ordered, and verified current allergies; name and call back number provided for further questions should they arise Darren Hur RN Navigator Cardiac Imaging [facility] [phone] office [phone] cell 

## 2019-11-16 ENCOUNTER — Encounter: Payer: Managed Care, Other (non HMO) | Admitting: *Deleted

## 2019-11-16 ENCOUNTER — Ambulatory Visit (HOSPITAL_COMMUNITY)
Admission: RE | Admit: 2019-11-16 | Discharge: 2019-11-16 | Disposition: A | Discharge status: Home / Self Care | Payer: Managed Care, Other (non HMO) | Source: Ambulatory Visit | Attending: Cardiology | Admitting: Cardiology

## 2019-11-16 ENCOUNTER — Other Ambulatory Visit: Payer: Self-pay

## 2019-11-16 DIAGNOSIS — I2510 Atherosclerotic heart disease of native coronary artery without angina pectoris: Secondary | ICD-10-CM

## 2019-11-16 DIAGNOSIS — R079 Chest pain, unspecified: Secondary | ICD-10-CM | POA: Insufficient documentation

## 2019-11-16 DIAGNOSIS — Z006 Encounter for examination for normal comparison and control in clinical research program: Secondary | ICD-10-CM

## 2019-11-16 IMAGING — CT CT HEART MORP W/ CTA COR W/ SCORE W/ CA W/CM &/OR W/O CM
2 of 8 series · 4 of 20 positions shown, 5 images · IV contrast (APPLIED)
Comparison: [DATE] chest radiograph.  [DATE] CTA chest.
COMPARISON: [DATE] chest radiograph.  [DATE] CTA chest.

Addendum:
EXAM:
OVER-READ INTERPRETATION  CT CHEST

The following report is an over-read performed by radiologist Dr.
SACRISTAN [REDACTED] on [DATE]. This over-read
does not include interpretation of cardiac or coronary anatomy or
pathology. The coronary CTA interpretation by the cardiologist is
attached.
CLINICAL DATA: Chest pain
Cardiac/Coronary CTA
TECHNIQUE: The patient was scanned on a Phillips Force scanner. A 100 kV
prospective scan was triggered in the descending thoracic aorta at
111 HU's. Axial non-contrast 3 mm slices were carried out through
the heart. The data set was analyzed on a dedicated work station and
scored using the Agatson method. Gantry rotation speed was 250 msecs
and collimation was .6 mm. No beta blockade and 0.8 mg of sl NTG was
given. The 3D data set was reconstructed in 5% intervals of the
35-75 % of the R-R cycle. Diastolic phases were analyzed on a
dedicated work station using MPR, MIP and VRT modes. The patient
received 80 cc of contrast.

[Series 9: ts syst sharp 41 % · axial · 0.45mm/px · z∈[+1142,+1184]mm · 2 of 316 slices shown]
[im 106/316  lung]
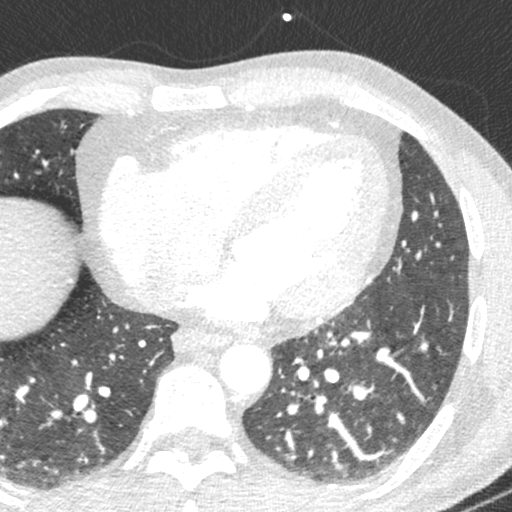
[im 211/316  lung]
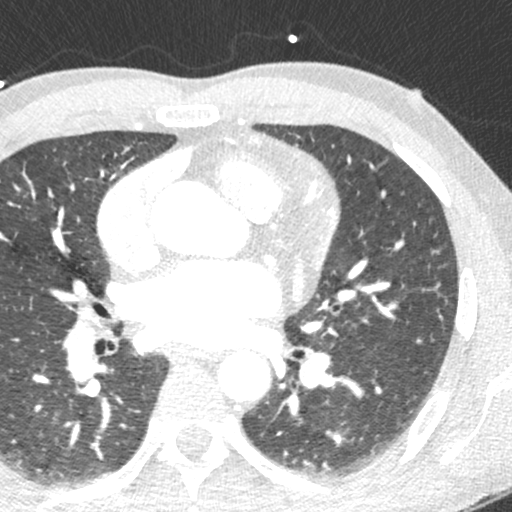

[Series 14: best diast manual · axial · 0.45mm/px · z∈[+1142,+1184]mm · 2 of 316 slices shown, 3 images]
[im 106/316  vessel]
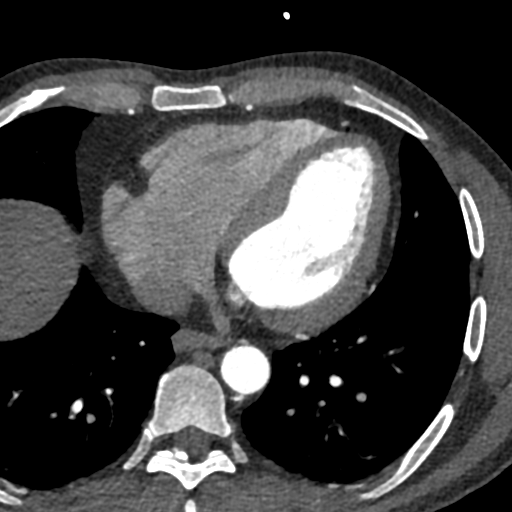
[im 106/316  lung]
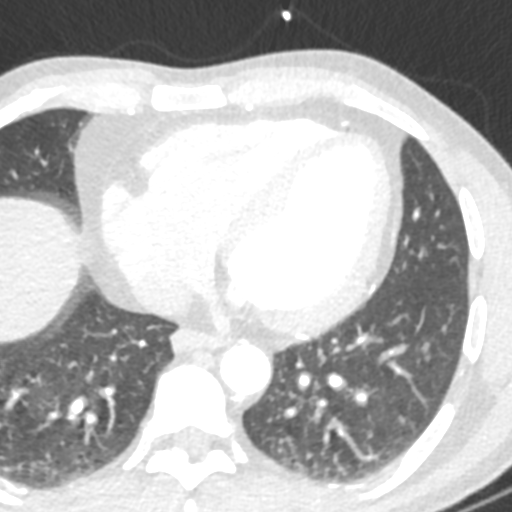
[im 211/316  vessel]
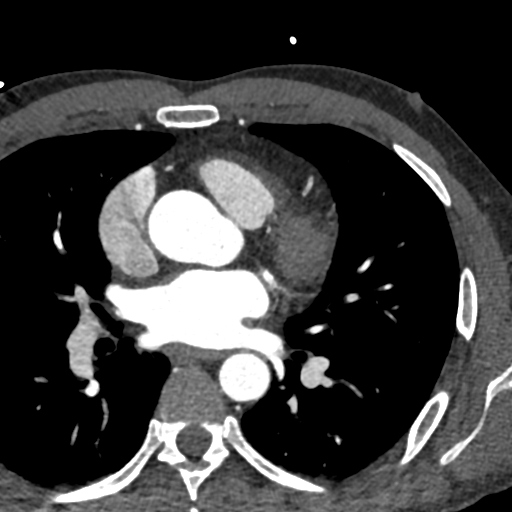

[4 of 20 positions shown; findings below may reference images not displayed]

FINDINGS: Vascular: Aortic atherosclerosis. No central pulmonary embolism, on
this non-dedicated study.

Mediastinum/Nodes: No imaged thoracic adenopathy.

Lungs/Pleura: No pleural fluid.  Clear imaged lungs.

Upper Abdomen: Too small to characterize high left hepatic lobe
lesion on 56/11 is similar in [5D] and can be presumed benign.
Normal imaged portions of the spleen, stomach.

Musculoskeletal: No acute osseous abnormality.
IMPRESSION: 1.  No acute findings in the imaged extracardiac chest.
2.  Aortic Atherosclerosis ([5D]-[5D]).
FINDINGS: Image quality: excellent.

Noise artifact is: Limited.

Coronary Arteries: Anomalous non-dominant RCA of the mid LAD with
pre-pulmonic course. Left dominance.

Left main: The left main is a large caliber vessel with a normal
take off from the left coronary cusp that bifurcates to form a left
anterior descending artery and a left circumflex artery. There is
mild non-calcified plaque in the distal left main (25-49%).

Left anterior descending artery: The proximal LAD is diffusely
diseased with mainly non-calcified plaque (some spotty
calcification, no LAP to suggest vulnerable plaque) that is mild in
severity (25-49%). The mid and distal LAD contain minimal
non-calcified plaque (<25%). The first diagonal contains moderate
non-calcified plaque (50-69%).

Left circumflex artery: Dominant. The proximal LCX contains minimal
calcified plaque (<25%). The mid and distal LCX contain minimal
non-calcified plaque (<25%). The LCX gives off 1 patent OM branch.
The LCX terminates as a patent PDA.

Right coronary artery: Anomalous origin off the mid LAD with
pre-pulmonic course. Non-dominant. The ostial segments contains a
moderate non-calcified stenosis (50-69%).

Right Atrium: Right atrial size is within normal limits.

Right Ventricle: The right ventricular cavity is within normal
limits.

Left Atrium: Left atrial size is normal in size with no left atrial
appendage filling defect. There is a small diverticulum of the
inferoposterior SACRISTAN. There is no communication to suggest PFO/ASD.

Left Ventricle: The ventricular cavity size is within normal limits.
There are no stigmata of prior infarction. There is no abnormal
filling defect.

Pulmonary arteries: Normal in size without proximal filling defect.

Pulmonary veins: Normal pulmonary venous drainage.

Pericardium: Normal thickness with no significant effusion or
calcium present.

Cardiac valves: The aortic valve is trileaflet without significant
calcification. The mitral valve is normal structure without
significant calcification.

Aorta: Normal caliber with no significant disease.

Extra-cardiac findings: See attached radiology report for
non-cardiac structures.
IMPRESSION: 1. Coronary calcium score of 15. This was 77th percentile for age
and sex matched control.

2. Anomalous non-dominant RCA off the LAD with pre-pulmonic course.

3. Mild CAD (25-49%) in the left main and proximal LAD.

4. Moderate CAD (50-69%) in the first diagonal branch.

5. Moderate CAD (50-69%) at the ostium of the anomalous non-dominant
RCA.

RECOMMENDATIONS:
1. Due to mild to moderate CAD mentioned above, CT FFR will be
submitted.

*** End of Addendum ***
EXAM:
OVER-READ INTERPRETATION  CT CHEST

The following report is an over-read performed by radiologist Dr.
SACRISTAN [REDACTED] on [DATE]. This over-read
does not include interpretation of cardiac or coronary anatomy or
pathology. The coronary CTA interpretation by the cardiologist is
attached.
FINDINGS: Vascular: Aortic atherosclerosis. No central pulmonary embolism, on
this non-dedicated study.

Mediastinum/Nodes: No imaged thoracic adenopathy.

Lungs/Pleura: No pleural fluid.  Clear imaged lungs.

Upper Abdomen: Too small to characterize high left hepatic lobe
lesion on 56/11 is similar in [5D] and can be presumed benign.
Normal imaged portions of the spleen, stomach.

Musculoskeletal: No acute osseous abnormality.
IMPRESSION: 1.  No acute findings in the imaged extracardiac chest.
2.  Aortic Atherosclerosis ([5D]-[5D]).

## 2019-11-16 MED ORDER — IOHEXOL 350 MG/ML SOLN
100.0000 mL | Freq: Once | INTRAVENOUS | Status: AC | PRN
  Administered 2019-11-16: 100 mL via INTRAVENOUS

## 2019-11-16 MED ORDER — METOPROLOL TARTRATE 5 MG/5ML IV SOLN: 5.0000 mg | Freq: Once | INTRAVENOUS | Status: AC

## 2019-11-16 MED ORDER — NITROGLYCERIN 0.4 MG SL SUBL
0.8000 mg | SUBLINGUAL_TABLET | Freq: Once | SUBLINGUAL | Status: AC
  Administered 2019-11-16: 0.8 mg via SUBLINGUAL

## 2019-11-16 MED ORDER — NITROGLYCERIN 0.4 MG SL SUBL
SUBLINGUAL_TABLET | SUBLINGUAL | Status: AC
  Filled 2019-11-16: qty 2

## 2019-11-16 MED ORDER — METOPROLOL TARTRATE 5 MG/5ML IV SOLN
INTRAVENOUS | Status: AC
  Administered 2019-11-16: 11:00:00 5 mg via INTRAVENOUS
  Filled 2019-11-16: qty 5

## 2019-11-16 NOTE — Research (Signed)
CADFEM Informed Consent   Subject Name: Darren Scott  Subject met inclusion and exclusion criteria.  The informed consent form, study requirements and expectations were reviewed with the subject and questions and concerns were addressed prior to the signing of the consent form.  The subject verbalized understanding of the trial requirements.  The subject agreed to participate in the CADFEM trial and signed the informed consent on [id number].  The informed consent was obtained prior to performance of any protocol-specific procedures for the subject.  A copy of the signed informed consent was given to the subject and a copy was placed in the subject's medical record.   Oletta Cohn.

## 2019-11-17 ENCOUNTER — Emergency Department (HOSPITAL_BASED_OUTPATIENT_CLINIC_OR_DEPARTMENT_OTHER): Payer: Managed Care, Other (non HMO)

## 2019-11-17 ENCOUNTER — Encounter (HOSPITAL_BASED_OUTPATIENT_CLINIC_OR_DEPARTMENT_OTHER): Payer: Self-pay

## 2019-11-17 ENCOUNTER — Other Ambulatory Visit: Payer: Self-pay

## 2019-11-17 ENCOUNTER — Emergency Department (HOSPITAL_BASED_OUTPATIENT_CLINIC_OR_DEPARTMENT_OTHER)
Admission: EM | Admit: 2019-11-17 | Discharge: 2019-11-17 | Disposition: A | Discharge status: Home / Self Care | Payer: Managed Care, Other (non HMO) | Attending: Emergency Medicine | Admitting: Emergency Medicine

## 2019-11-17 DIAGNOSIS — I2510 Atherosclerotic heart disease of native coronary artery without angina pectoris: Secondary | ICD-10-CM | POA: Diagnosis not present

## 2019-11-17 DIAGNOSIS — M79641 Pain in right hand: Secondary | ICD-10-CM | POA: Diagnosis present

## 2019-11-17 DIAGNOSIS — Z79899 Other long term (current) drug therapy: Secondary | ICD-10-CM | POA: Diagnosis not present

## 2019-11-17 DIAGNOSIS — I10 Essential (primary) hypertension: Secondary | ICD-10-CM | POA: Diagnosis not present

## 2019-11-17 DIAGNOSIS — E785 Hyperlipidemia, unspecified: Secondary | ICD-10-CM | POA: Diagnosis not present

## 2019-11-17 DIAGNOSIS — S6991XA Unspecified injury of right wrist, hand and finger(s), initial encounter: Secondary | ICD-10-CM

## 2019-11-17 IMAGING — DX DG HAND COMPLETE 3+V*R*
3 series · 3 of 3 positions shown · non-contrast
Comparison: None.

CLINICAL DATA: Crush injury, pain at the second through fifth
metacarpals

EXAM:
RIGHT HAND - COMPLETE 3+ VIEW

[hand pa]
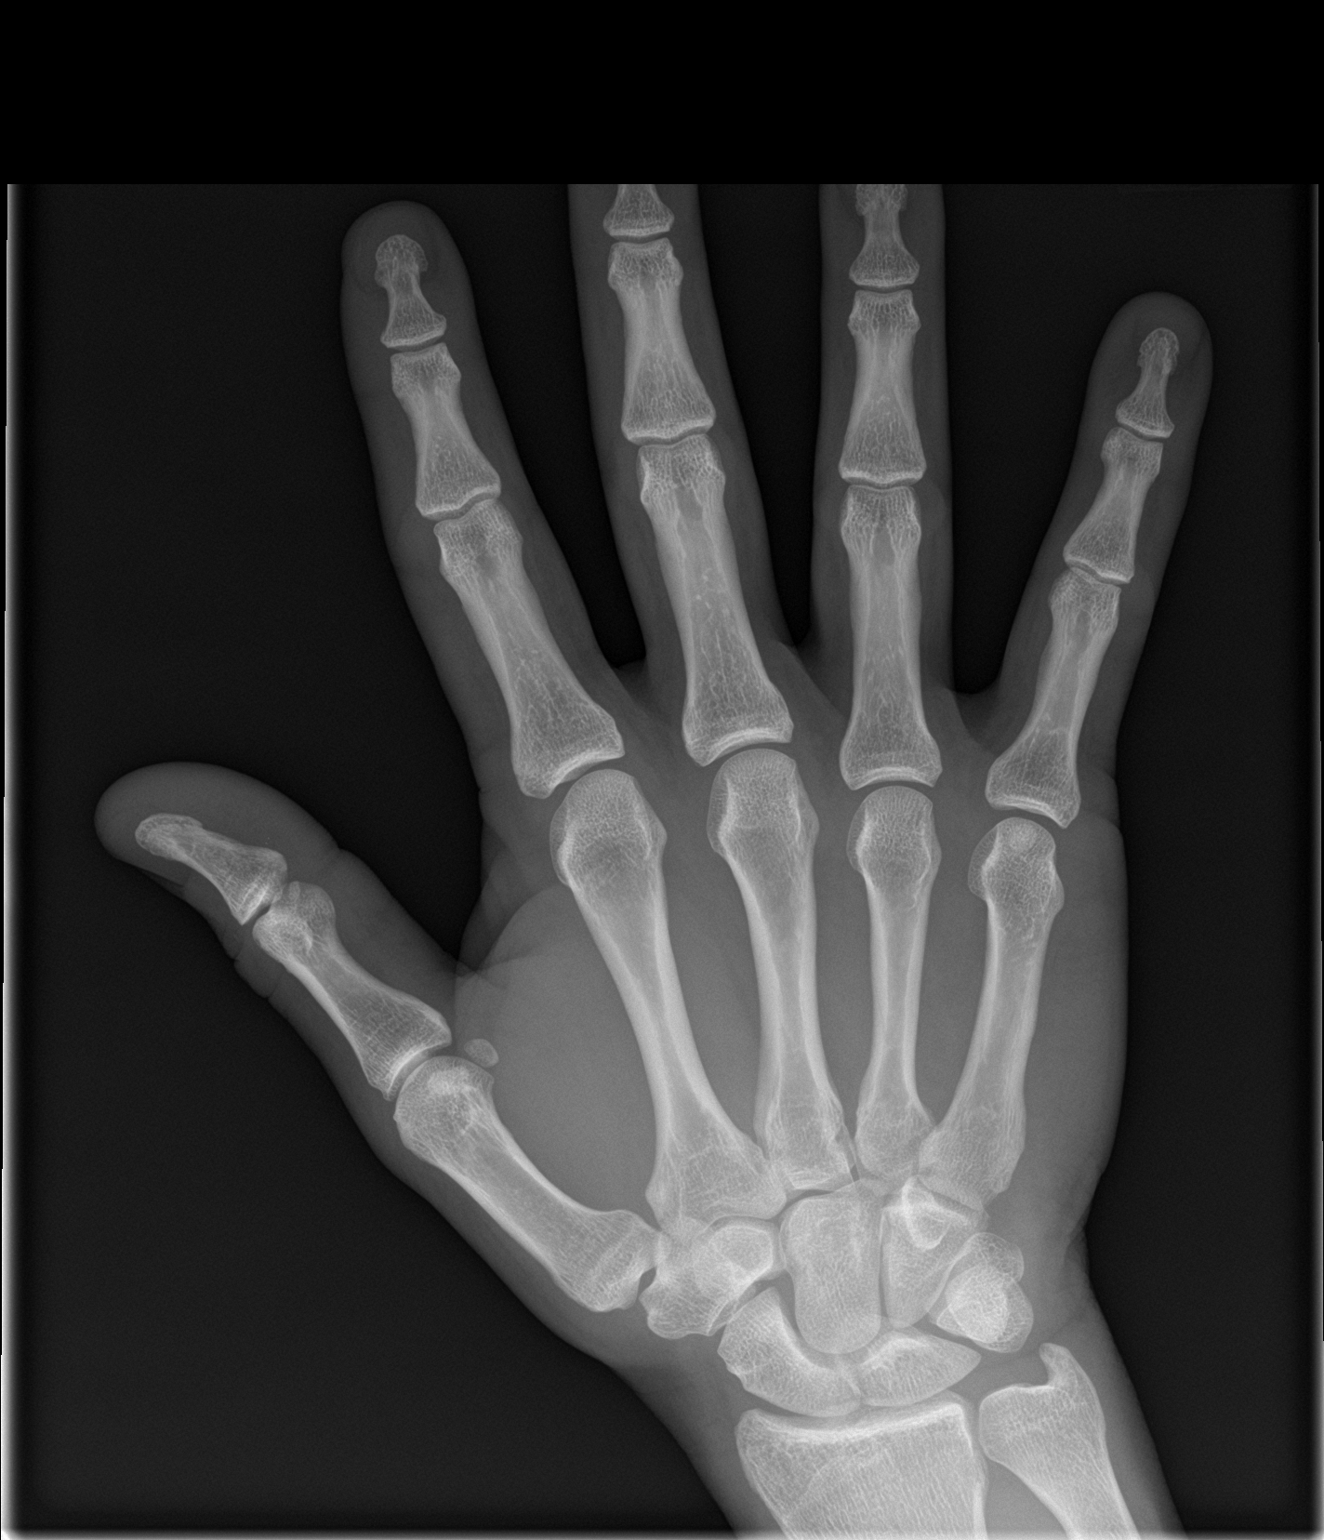

[hand obl]
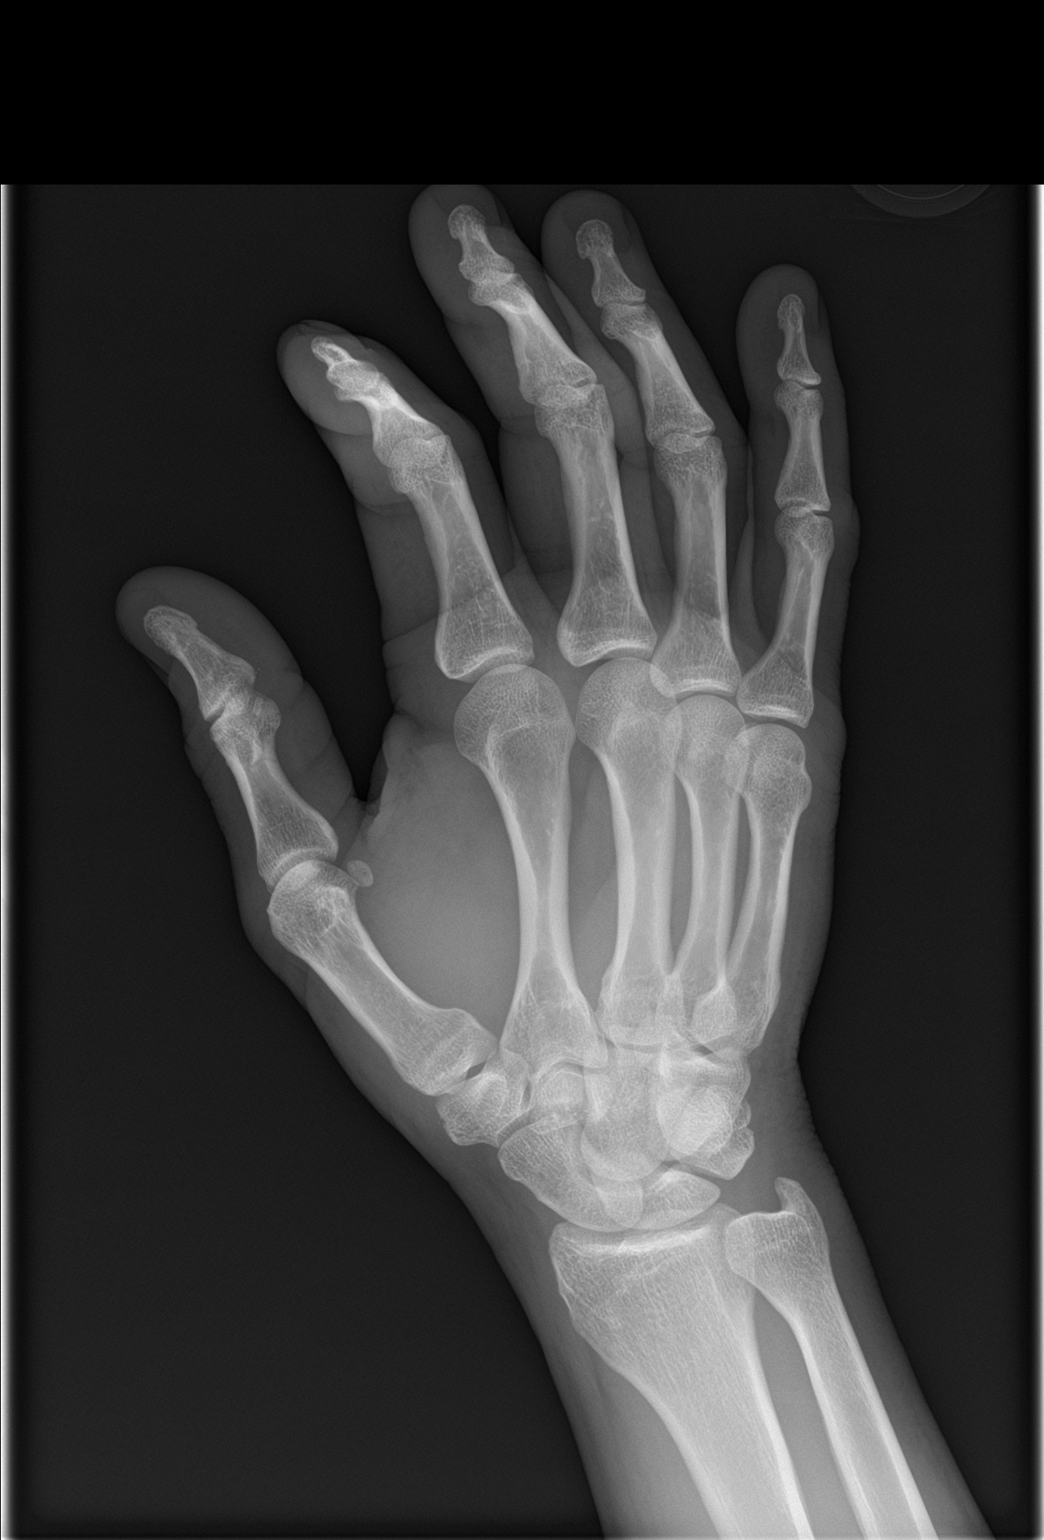

[hand lat]
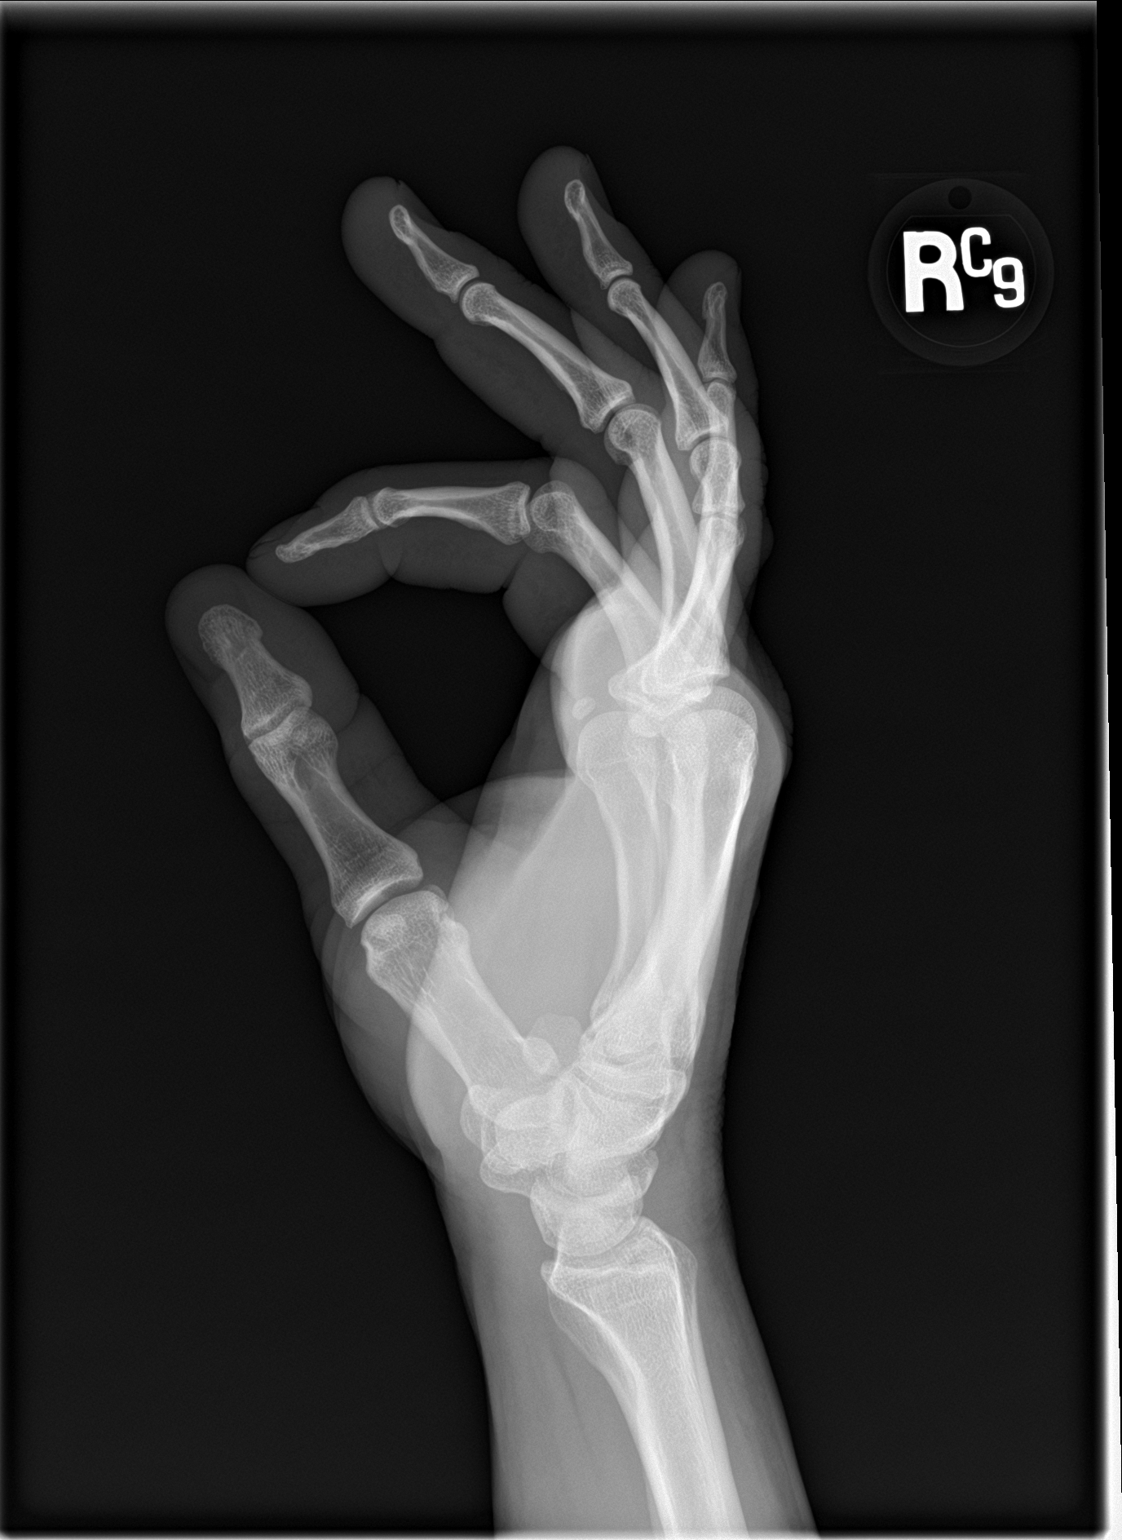

[3 of 3 positions shown; findings below may reference images not displayed]

FINDINGS: Frontal, oblique, lateral views of the right hand are obtained. No
acute displaced fracture. Alignment is anatomic. Joint spaces are
well preserved. Soft tissues are normal.
IMPRESSION: 1. Unremarkable right hand.

## 2019-11-17 NOTE — ED Provider Notes (Signed)
[facility] EMERGENCY DEPARTMENT Provider Note   CSN: [id number] Arrival date & time: 11/17/19  1928     History Chief Complaint  Patient presents with  . Hand Injury    Darren Scott is a 49 y.o. male with no relevant PMH brought to the ED after sustaining a work-related injury.  Patient reports that he works for [employer] and was installing a transfer case underneath a vehicle when he lost control and dropped the 150 pound unit onto the palmar aspect of his hand.  He complains of "soreness" particularly over third and fourth MCP joints.  He presents to the ED hoping to rule out any fracture, dislocation, or other bony abnormalities.  He denies diminished ROM, diminished sensation, weakness, open wound, or other injury.  HPI     Past Medical History:  Diagnosis Date  . Hyperlipidemia   . Hypertension     Patient Active Problem List   Diagnosis Date Noted  . Atypical chest pain 02/20/2018  . Dyslipidemia 02/20/2018  . Acute bronchitis 08/16/2014  . Routine general medical examination at a health care facility 12/29/2013  . History of hypertension 12/29/2013  . Other and unspecified hyperlipidemia 12/29/2013  . Hyperglycemia 12/29/2013    Past Surgical History:  Procedure Laterality Date  . KNEE SURGERY Left 2004       Family History  Problem Relation Age of Onset  . Heart attack Father        died in his 25's, smoker  . Cancer Neg Hx   . Diabetes Neg Hx     Social History   Tobacco Use  . Smoking status: Never Smoker  . Smokeless tobacco: Current User    Types: Chew  Substance Use Topics  . Alcohol use: Yes    Alcohol/week: 0.0 standard drinks    Comment: occ  . Drug use: No    Home Medications Prior to Admission medications   Medication Sig Start Date End Date Taking? Authorizing Provider  amLODipine (NORVASC) 10 MG tablet TAKE 1 TABLET BY MOUTH EVERY DAY 06/09/19   [name], NP  atorvastatin (LIPITOR) 20 MG tablet  Take 1 tablet (20 mg total) by mouth daily. Patient not taking: Reported on 11/16/2019 08/18/19 11/16/19  Tobb, Lavona [place]ound, DO  carvedilol (COREG) 3.125 MG tablet Take 1 tablet (3.125 mg total) by mouth 2 (two) times daily. Patient not taking: Reported on 11/16/2019 08/18/19 11/16/19  Tobb, Lavona [place]ound, DO  metoprolol succinate (TOPROL-XL) 50 MG 24 hr tablet TAKE 1 TABLET (50 MG TOTAL) BY MOUTH DAILY. TAKE WITH OR IMMEDIATELY FOLLOWING A MEAL. 10/08/19   [name], NP    Allergies    Patient has no known allergies.  Review of Systems   Review of Systems  Musculoskeletal: Positive for arthralgias.  Skin: Negative for wound.  Hematological: Does not bruise/bleed easily.    Physical Exam Updated Vital Signs BP (!) 134/95 (BP Location: Left Arm)   Pulse 77   Temp 98.1 F (36.7 C) (Oral)   Resp 18   Ht 5\' 8"  (1.727 m)   Wt 73 kg   SpO2 100%   BMI 24.48 kg/m   Physical Exam Vitals and nursing note reviewed. Exam conducted with a chaperone present.  Constitutional:      Appearance: Normal appearance.  HENT:     Head: Normocephalic and atraumatic.  Eyes:     General: No scleral icterus.    Conjunctiva/sclera: Conjunctivae normal.  Cardiovascular:     Rate and Rhythm: Normal rate and  regular rhythm.     Pulses: Normal pulses.     Heart sounds: Normal heart sounds.  Pulmonary:     Effort: Pulmonary effort is normal. No respiratory distress.     Breath sounds: Normal breath sounds.  Musculoskeletal:     Cervical back: Normal range of motion.     Comments: Right hand: No significant swelling, ecchymoses, or other overlying skin changes appreciated.  Able to flex and extend all fingers against resistance.  Assessed radial, ulnar, and median nerves in all intact.  TTP over third and fourth MCP joints. Right wrist: Normal.  ROM and strength intact. Right elbow: Normal.  ROM and strength intact.  Skin:    General: Skin is dry.     Capillary Refill: Capillary refill takes less than 2  seconds.  Neurological:     Mental Status: He is alert.     GCS: GCS eye subscore is 4. GCS verbal subscore is 5. GCS motor subscore is 6.  Psychiatric:        Mood and Affect: Mood normal.        Behavior: Behavior normal.        Thought Content: Thought content normal.       ED Results / Procedures / Treatments   Labs (all labs ordered are listed, but only abnormal results are displayed) Labs Reviewed - No data to display  EKG None  Radiology CT CORONARY [facility] W/CTA COR W/SCORE W/CA W/CM &/OR WO/CM  Addendum Date: 11/16/2019   ADDENDUM REPORT: 11/16/2019 19:22 CLINICAL DATA:  Chest pain EXAM: Cardiac/Coronary CTA TECHNIQUE: The patient was scanned on a [employer]. A 100 kV prospective scan was triggered in the descending thoracic aorta at 111 HU's. Axial non-contrast 3 mm slices were carried out through the heart. The data set was analyzed on a dedicated work station and scored using the Agatson method. Gantry rotation speed was 250 msecs and collimation was .6 mm. No beta blockade and 0.8 mg of sl NTG was given. The 3D data set was reconstructed in 5% intervals of the 35-75 % of the R-R cycle. Diastolic phases were analyzed on a dedicated work station using MPR, MIP and VRT modes. The patient received 80 cc of contrast. FINDINGS: Image quality: excellent. Noise artifact is: Limited. 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 Left dominance. Left main: The left main is a large caliber vessel with a normal take off from the left coronary cusp that bifurcates to form a left anterior descending artery and a left circumflex artery. There is mild non-calcified plaque in the distal left main (25-49%). Left anterior descending artery: The proximal LAD is diffusely diseased with mainly non-calcified plaque (some spotty calcification, no LAP to suggest vulnerable plaque) that is mild in severity (25-49%). The mid and distal LAD contain minimal  non-calcified plaque (<25%). The first diagonal contains moderate non-calcified plaque (50-69%). Left circumflex artery: Dominant. The proximal LCX contains minimal calcified plaque (<25%). The mid and distal LCX contain minimal non-calcified plaque (<25%). The LCX gives off 1 patent OM branch. The LCX terminates as a patent PDA. Right coronary artery: Anomalous origin off the mid LAD with pre-pulmonic course. Non-dominant. The ostial segments contains a moderate non-calcified stenosis (50-69%). Right Atrium: Right atrial size is within normal limits. Right Ventricle: The right ventricular cavity is within normal limits. Left Atrium: Left atrial size is normal in size with no left atrial appendage filling defect. There is a small diverticulum of the inferoposterior  IAS. There is no communication to suggest PFO/ASD. Left Ventricle: The ventricular cavity size is within normal limits. There are no stigmata of prior infarction. There is no abnormal filling defect. Pulmonary arteries: Normal in size without proximal filling defect. Pulmonary veins: Normal pulmonary venous drainage. Pericardium: Normal thickness with no significant effusion or calcium present. Cardiac valves: The aortic valve is trileaflet without significant calcification. The mitral valve is normal structure without significant calcification. Aorta: Normal caliber with no significant disease. Extra-cardiac findings: See attached radiology report for non-cardiac structures. IMPRESSION: 1. Coronary calcium score of 15. This was 77th percentile for age and sex matched control. 2. 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 3. Mild CAD (25-49%) in the left main and proximal LAD. 4. Moderate CAD (50-69%) in the first diagonal branch. 5. Moderate CAD (50-69%) at the ostium of the anomalous non-dominant RCA. RECOMMENDATIONS: 1. Due to mild to moderate CAD mentioned above, CT FFR will be submitted. Eleonore Chiquito, MD Electronically Signed    By: Eleonore Chiquito   On: 11/16/2019 19:22   Result Date: 11/16/2019 EXAM: OVER-READ INTERPRETATION  CT CHEST The following report is an over-read performed by radiologist Dr. Abigail Miyamoto of [facility], [place] on 11/16/2019. This over-read does not include interpretation of cardiac or coronary anatomy or pathology. The coronary CTA interpretation by the cardiologist is attached. COMPARISON:  12/29/2013 chest radiograph.  12/15/2013 CTA chest. FINDINGS: Vascular: Aortic atherosclerosis. No central pulmonary embolism, on this non-dedicated study. Mediastinum/Nodes: No imaged thoracic adenopathy. Lungs/Pleura: No pleural fluid.  Clear imaged lungs. Upper Abdomen: Too small to characterize high left hepatic lobe lesion on 56/11 is similar in 2015 and can be presumed benign. Normal imaged portions of the spleen, stomach. Musculoskeletal: No acute osseous abnormality. IMPRESSION: 1.  No acute findings in the imaged extracardiac chest. 2.  Aortic Atherosclerosis (ICD10-I70.0). Electronically Signed: By: Abigail Miyamoto M.D. On: 11/16/2019 15:15   DG Hand Complete Right  Result Date: 11/17/2019 CLINICAL DATA:  Crush injury, pain at the second through fifth metacarpals EXAM: RIGHT HAND - COMPLETE 3+ VIEW COMPARISON:  None. FINDINGS: Frontal, oblique, lateral views of the right hand are obtained. No acute displaced fracture. Alignment is anatomic. Joint spaces are well preserved. Soft tissues are normal. IMPRESSION: 1. Unremarkable right hand. Electronically Signed   By: Randa Ngo M.D.   On: 11/17/2019 20:00   CT CORONARY FRACTIONAL FLOW RESERVE DATA PREP  Result Date: 11/17/2019 EXAM: CT FFR ANALYSIS CLINICAL DATA:  Chest pain FINDINGS: FFRct analysis was performed on the original cardiac CT angiogram dataset. Diagrammatic representation of the FFRct analysis is provided in a separate PDF document in PACS. This dictation was created using the PDF document and an interactive 3D model of the results. 3D model  is not available in the EMR/PACS. Normal FFR range is >0.80. 1. Left Main: 0.96; no significant stenosis. 2. LAD: 0.82; no significant stenosis. 3. LCX: 0.90; no significant stenosis. 4. RCA: <0.50; significant stenosis in the non-dominant RCA that has anomalous origin off the mid LAD with pre-pulmonic course. IMPRESSION: 1. CT FFR positive for significant stenosis in the non-dominant RCA off the mid RCA with pre-pulmonic course. Eleonore Chiquito, MD Electronically Signed   By: Eleonore Chiquito   On: 11/17/2019 17:31    Procedures Procedures (including critical care time)  Medications Ordered in ED Medications - No data to display  ED Course  I have reviewed the triage vital signs and the nursing notes.  Pertinent labs & imaging results that  were available during my care of the patient were reviewed by me and considered in my medical decision making (see chart for details).    MDM Rules/Calculators/A&P                      I reviewed plain films obtained of his right hand which demonstrated no evidence of fracture, dislocation, or other bony abnormalities.  Patient is not complaining of any significant discomfort and denies any analgesia here in the ED.  Recommending that he continue to ice the affected area at home and take ibuprofen and Tylenol, as needed.  Do not suspect any ligamentous or tendinous injury given reassuring physical exam, however would like patient to follow-up with hand specialist at his symptoms fail to improve.  ED return precautions also discussed.  Patient voices understanding and is agreeable to the plan.  All of the evaluation and work-up results were discussed with the patient and any family at bedside. They were provided opportunity to ask any additional questions and have none at this time. They have expressed understanding of verbal discharge instructions as well as return precautions and are agreeable to the plan.    Final Clinical Impression(s) / ED Diagnoses Final  diagnoses:  Injury of right hand, initial encounter    Rx / DC Orders ED Discharge Orders    None       Elvera Maria 11/17/19 2104    Gwyneth Sprout, MD 11/17/19 2328

## 2019-11-17 NOTE — ED Triage Notes (Signed)
Pt states he dropped ~100lb "transfer case" on right hand at home ~1 hour PTA-no break in skin noted-NAD-steady gait

## 2019-11-17 NOTE — Discharge Instructions (Signed)
Your plain films are reassuring and there is no evidence of fracture, dislocation, or any other bony abnormalities.  However, we cannot rule out tenderness, ligamentous, or muscular injury.  Please take ibuprofen and Tylenol as needed for your symptoms of discomfort.  I recommend that you continue to apply ice, as needed.  Please call [facility] to schedule an appointment with Dr. Janee Morn should your symptoms fail to improve despite conservative management.  Return to the ED or seek immediate medical attention for any new or worsening symptoms.

## 2019-11-19 ENCOUNTER — Telehealth: Payer: Self-pay

## 2019-11-19 NOTE — Telephone Encounter (Signed)
-----   Message from [name], DO sent at 11/18/2019 10:01 AM EST ----- I am looking forward to discussing your CTA results with you on March 3.  Looking at your medications I see you are on carvedilol 3.125 which has started you back in November.  But I noticed that you have been started on Toprol XL 50 mg daily.  Please stop the Toprol XL.  You are taking 2 beta-blockers (carvedilol and Toprol XL).  For now would like to start you on aspirin 81 mg daily until I see you on the third.

## 2019-11-19 NOTE — Telephone Encounter (Signed)
Tobb, Kardie, DO  P Cv Div [facility] Triage  I am looking forward to discussing your CTA results with you on March 3. Looking at your medications I see you are on carvedilol 3.125 which has started you back in November. But I noticed that you have been started on Toprol XL 50 mg daily. Please stop the Toprol XL. You are taking 2 beta-blockers (carvedilol and Toprol XL). For now would like to start you on aspirin 81 mg daily until I see you on the third.   Pt made aware of the above. Pt states he has never taken Carvedilol or his Lipitor. He has only been taking 50mg  Toprol XL and his Norvasc.   Will route to Tobb for advisement.

## 2019-11-19 NOTE — Telephone Encounter (Signed)
Thanks for letting me know. We can keep him on this regimen.

## 2019-11-24 ENCOUNTER — Encounter: Payer: Self-pay | Admitting: Cardiology

## 2019-11-24 ENCOUNTER — Other Ambulatory Visit: Payer: Self-pay

## 2019-11-24 ENCOUNTER — Ambulatory Visit: Payer: Managed Care, Other (non HMO) | Admitting: Cardiology

## 2019-11-24 VITALS — BP 114/86 | HR 80 | Ht 68.0 in | Wt 164.0 lb

## 2019-11-24 DIAGNOSIS — I2510 Atherosclerotic heart disease of native coronary artery without angina pectoris: Secondary | ICD-10-CM

## 2019-11-24 DIAGNOSIS — E782 Mixed hyperlipidemia: Secondary | ICD-10-CM | POA: Diagnosis not present

## 2019-11-24 DIAGNOSIS — I10 Essential (primary) hypertension: Secondary | ICD-10-CM

## 2019-11-24 MED ORDER — NITROGLYCERIN 0.4 MG SL SUBL: 0.4000 mg | SUBLINGUAL_TABLET | SUBLINGUAL | 3 refills | Status: DC | PRN

## 2019-11-24 MED ORDER — ATORVASTATIN CALCIUM 20 MG PO TABS: 20.0000 mg | ORAL_TABLET | Freq: Every day | ORAL | 1 refills | Status: DC

## 2019-11-24 MED ORDER — ASPIRIN EC 81 MG PO TBEC: 81.0000 mg | DELAYED_RELEASE_TABLET | Freq: Every day | ORAL | 3 refills | Status: AC

## 2019-11-24 NOTE — Patient Instructions (Signed)
Medication Instructions:  Your physician has recommended you make the following change in your medication:   START: Aspirin 81 mg Take 1 tab daily START: Lipitor(atorvastatin) 20 Mg Take 1 tab daily  Nitroglycerin 0.4 mg sublingual (under your tongue) as needed for chest pain. If experiencing chest pain, stop what you are doing and sit down. Take 1 nitroglycerin and wait 5 minutes. If chest pain continues, take another nitroglycerin and wait 5 minutes. If chest pain does not subside, take 1 more nitroglycerin and dial 911. You make take a total of 3 nitroglycerin in a 15 minute time frame.   *If you need a refill on your cardiac medications before your next appointment, please call your pharmacy*   Lab Work: Your physician recommends that you return for lab work in: BEFORE NEXT VISIT Fasting Lipids   If you have labs (blood work) drawn today and your tests are completely normal, you will receive your results only by: Marland Kitchen MyChart Message (if you have MyChart) OR . A paper copy in the mail If you have any lab test that is abnormal or we need to change your treatment, we will call you to review the results.   Testing/Procedures: None   Follow-Up: At [facility], you and your health needs are our priority.  As part of our continuing mission to provide you with exceptional heart care, we have created designated Provider Care Teams.  These Care Teams include your primary Cardiologist (physician) and Advanced Practice Providers (APPs -  Physician Assistants and Nurse Practitioners) who all work together to provide you with the care you need, when you need it.  We recommend signing up for the patient portal called "MyChart".  Sign up information is provided on this After Visit Summary.  MyChart is used to connect with patients for Virtual Visits (Telemedicine).  Patients are able to view lab/test results, encounter notes, upcoming appointments, etc.  Non-urgent messages can be sent to your  provider as well.   To learn more about what you can do with MyChart, go to [email or web address].    Your next appointment:   3 month(s)  The format for your next appointment:   In Person  Provider:   [name], DO   Other Instructions

## 2019-11-24 NOTE — Progress Notes (Signed)
Cardiology Office Note:    Date:  11/24/2019   ID:  Darren Scott, DOB 1971/09/04, MRN [id number]  PCP:  Darren Craze, NP  Cardiologist:  Darren Ripple, DO  Electrophysiologist:  None   Referring MD: Darren Craze, NP   Follow up visit   History of Present Illness:    Darren Scott is a 49 y.o. male with a hx of Hypertension, hyperlipidemia who initially presented to be evaluated for chest pain.  At the time of his evaluation he did express that his family had premature history of coronary disease.  At the conclusion of his visit I recommend the patient undergo a coronary CTA as well as a transthoracic echocardiogram.  That same day started on Lipitor 20 mg daily as his LDL reported was 121, HDL 38 closer to 19 and triglycerides 272.  I also asked him to stop the Toprol-XL and start carvedilol 1 2 5  mg twice a day due to higher blood pressure in the office.  In the interim the patient has been able to do his testing.  He is here today to discuss the results.  Tells me that he is only experiences chest pain once since the last time I saw him.  Denies any shortness of breath, lightheadedness or dizziness.  Past Medical History:  Diagnosis Date  . Hyperlipidemia   . Hypertension     Past Surgical History:  Procedure Laterality Date  . KNEE SURGERY Left 2004    Current Medications: Current Meds  Medication Sig  . amLODipine (NORVASC) 10 MG tablet TAKE 1 TABLET BY MOUTH EVERY DAY  . metoprolol succinate (TOPROL-XL) 50 MG 24 hr tablet TAKE 1 TABLET (50 MG TOTAL) BY MOUTH DAILY. TAKE WITH OR IMMEDIATELY FOLLOWING A MEAL.     Allergies:   Patient has no known allergies.   Social History   Socioeconomic History  . Marital status: Single    Spouse name: Not on file  . Number of children: Not on file  . Years of education: Not on file  . Highest education level: Not on file  Occupational History  . Not on file  Tobacco Use  . Smoking status: Never Smoker  .  Smokeless tobacco: Current User    Types: Chew  Substance and Sexual Activity  . Alcohol use: Yes    Alcohol/week: 0.0 standard drinks    Comment: occ  . Drug use: No  . Sexual activity: Not on file  Other Topics Concern  . Not on file  Social History Narrative   3 children 49 yr old son, 49 yr old daughter, son age 49   Separated, has joint custody.  Oldest son lives with his first wife in 5   Works as [employer]   Completed 8th grade   Enjoys- hunting/fishing golfing   Social Determinants of Financial risk analyst Strain:   . Difficulty of Paying Living Expenses: Not on file  Food Insecurity:   . Worried About Corporate investment banker in the Last Year: Not on file  . Ran Out of Food in the Last Year: Not on file  Transportation Needs:   . Lack of Transportation (Medical): Not on file  . Lack of Transportation (Non-Medical): Not on file  Physical Activity:   . Days of Exercise per Week: Not on file  . Minutes of Exercise per Session: Not on file  Stress:   . Feeling of Stress : Not on file  Social Connections:   . Frequency  of Communication with Friends and Family: Not on file  . Frequency of Social Gatherings with Friends and Family: Not on file  . Attends Religious Services: Not on file  . Active Member of Clubs or Organizations: Not on file  . Attends Archivist Meetings: Not on file  . Marital Status: Not on file     Family History: The patient's family history includes Heart attack in his father. There is no history of Cancer or Diabetes.  ROS:   Review of Systems  Constitution: Negative for decreased appetite, fever and weight gain.  HENT: Negative for congestion, ear discharge, hoarse voice and sore throat.   Eyes: Negative for discharge, redness, vision loss in right eye and visual halos.  Cardiovascular: Negative for chest pain, dyspnea on exertion, leg swelling, orthopnea and palpitations.  Respiratory: Negative for cough,  hemoptysis, shortness of breath and snoring.   Endocrine: Negative for heat intolerance and polyphagia.  Hematologic/Lymphatic: Negative for bleeding problem. Does not bruise/bleed easily.  Skin: Negative for flushing, nail changes, rash and suspicious lesions.  Musculoskeletal: Negative for arthritis, joint pain, muscle cramps, myalgias, neck pain and stiffness.  Gastrointestinal: Negative for abdominal pain, bowel incontinence, diarrhea and excessive appetite.  Genitourinary: Negative for decreased libido, genital sores and incomplete emptying.  Neurological: Negative for brief paralysis, focal weakness, headaches and loss of balance.  Psychiatric/Behavioral: Negative for altered mental status, depression and suicidal ideas.  Allergic/Immunologic: Negative for HIV exposure and persistent infections.    EKGs/Labs/Other Studies Reviewed:    The following studies were reviewed today:   EKG: None today  Transthoracic echocardiogram IMPRESSIONS 08/24/2019 1. Left ventricular ejection fraction, by visual estimation, is 60 to  65%. There is no left ventricular hypertrophy.  2. Global right ventricle has normal systolic function.The right  ventricular size is normal. No increase in right ventricular wall  thickness.  3. Left atrial size was normal.  4. The mitral valve is normal in structure. No evidence of mitral valve  regurgitation. No evidence of mitral stenosis.  5. The tricuspid valve is normal in structure. Tricuspid valve  regurgitation is not demonstrated.  6. The aortic valve is normal in structure.   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 Left dominance.  Left main: The left main is a large caliber vessel with a normal take off from the left coronary cusp that bifurcates to form a left anterior descending artery and a left circumflex artery. There is mild non-calcified plaque in the distal left main  (25-49%).  Left anterior descending artery: The proximal LAD is diffusely diseased with mainly non-calcified plaque (some spotty calcification, no LAP to suggest vulnerable plaque) that is mild in severity (25-49%). The mid and distal LAD contain minimal non-calcified plaque (<25%). The first diagonal contains moderate non-calcified plaque (50-69%).  Left circumflex artery: Dominant. The proximal LCX contains minimal calcified plaque (<25%). The mid and distal LCX contain minimal non-calcified plaque (<25%). The LCX gives off 1 patent OM branch. The LCX terminates as a patent PDA.  Right coronary artery: Anomalous origin off the mid LAD with pre-pulmonic course. Non-dominant. The ostial segments contains a moderate non-calcified stenosis (50-69%).  Right Atrium: Right atrial size is within normal limits.  Right Ventricle: The right ventricular cavity is within normal limits.  Left Atrium: Left atrial size is normal in size with no left atrial appendage filling defect. There is a small diverticulum of the inferoposterior IAS. There is no communication to suggest PFO/ASD.  Left Ventricle: The ventricular cavity size is within normal limits. There are no stigmata of prior infarction. There is no abnormal filling defect.  Pulmonary arteries: Normal in size without proximal filling defect.  Pulmonary veins: Normal pulmonary venous drainage.  Pericardium: Normal thickness with no significant effusion or calcium present.  Cardiac valves: The aortic valve is trileaflet without significant calcification. The mitral valve is normal structure without significant calcification.  Aorta: Normal caliber with no significant disease.  Extra-cardiac findings: See attached radiology report for non-cardiac structures.  IMPRESSION: 1. Coronary calcium score of 15. This was 77th percentile for age and sex matched control.  2. Anomalous non-dominant RCA off the LAD with  pre-pulmonic course.  3. Mild CAD (25-49%) in the left main and proximal LAD.  4. Moderate CAD (50-69%) in the first diagonal branch.  5. Moderate CAD (50-69%) at the ostium of the anomalous non-dominant RCA.  RECOMMENDATIONS: 1. Due to mild to moderate CAD mentioned above, CT FFR will be submitted.   CT FFR Impression 1. Left Main: 0.96; no significant stenosis. 2. LAD: 0.82; no significant stenosis. 3. LCX: 0.90; no significant stenosis. 4. RCA: <0.50; significant stenosis in the non-dominant RCA that has anomalous origin off the mid LAD with pre-pulmonic course.  IMPRESSION: 1. CT FFR positive for significant stenosis in the non-dominant RCA off the mid RCA with pre-pulmonic course.  Recent Labs: 08/10/2019: ALT 25; Hemoglobin 14.6; Platelets 230.0; TSH 0.89 10/07/2019: BUN 15; Creatinine, Ser 1.10; Potassium 3.9; Sodium 139  Recent Lipid Panel    Component Value Date/Time   CHOL 219 (H) 08/10/2019 1531   TRIG 272.0 (H) 08/10/2019 1531   HDL 38.10 (L) 08/10/2019 1531   CHOLHDL 6 08/10/2019 1531   VLDL 54.4 (H) 08/10/2019 1531   LDLCALC 139 (H) 03/19/2018 1003   LDLDIRECT 124.0 08/10/2019 1531    Physical Exam:    VS:  BP 114/86   Pulse 80   Ht 5\' 8"  (1.727 m)   Wt 164 lb (74.4 kg)   SpO2 97%   BMI 24.94 kg/m     Wt Readings from Last 3 Encounters:  11/24/19 164 lb (74.4 kg)  11/17/19 161 lb (73 kg)  08/18/19 161 lb (73 kg)     GEN: Well nourished, well developed in no acute distress HEENT: Normal NECK: No JVD; No carotid bruits LYMPHATICS: No lymphadenopathy CARDIAC: S1S2 noted,RRR, no murmurs, rubs, gallops RESPIRATORY:  Clear to auscultation without rales, wheezing or rhonchi  ABDOMEN: Soft, non-tender, non-distended, +bowel sounds, no guarding. EXTREMITIES: No edema, No cyanosis, no clubbing MUSCULOSKELETAL:  No deformity  SKIN: Warm and dry NEUROLOGIC:  Alert and oriented x 3, non-focal PSYCHIATRIC:  Normal affect, good  insight  ASSESSMENT:    1. Coronary artery disease involving native coronary artery of native heart without angina pectoris   2. Essential hypertension   3. Mixed hyperlipidemia    PLAN:      Coronary artery disease by coronary CTA.  I have educated the patient about his new diagnosis.  I also discussed with him about his significant FFR on his nondominant RCA.  For for now is clinically best to start the patient on medical management with aspirin 81 mg daily, atorvastatin 20 mg daily, given as needed nitroglycerin and he is also on beta-blocker.  I have educated him on the use of nitroglycerin.  He is very apprehensive to start medication but he understands that he needs this medication to help slow down the progression of coronary artery disease.  Hypertension-his  blood pressures acceptable in the office today.  He did not take the carvedilol therefore I am going to continue him on amlodipine 10 mg daily as well as metoprolol succinate 50 mg daily.  Hyperlipidemia-the patient reported that he did not start Lipitor due to side effects that he read on the computer.  I have educated him that at least with his coronary artery disease to give this medication a try and if he does experience side effects will alter the medication or find a better second option.  He will get repeat lipid profile before his next office visit.  The patient is in agreement with the above plan. The patient left the office in stable condition.  The patient will follow up in 3 months or sooner if needed.  Medication Adjustments/Labs and Tests Ordered: Current medicines are reviewed at length with the patient today.  Concerns regarding medicines are outlined above.  No orders of the defined types were placed in this encounter.  Meds ordered this encounter  Medications  . nitroGLYCERIN (NITROSTAT) 0.4 MG SL tablet    Sig: Place 1 tablet (0.4 mg total) under the tongue every 5 (five) minutes as needed.    Dispense:  30  tablet    Refill:  3  . atorvastatin (LIPITOR) 20 MG tablet    Sig: Take 1 tablet (20 mg total) by mouth daily.    Dispense:  90 tablet    Refill:  1  . aspirin EC 81 MG tablet    Sig: Take 1 tablet (81 mg total) by mouth daily.    Dispense:  90 tablet    Refill:  3    Patient Instructions  Medication Instructions:  Your physician has recommended you make the following change in your medication:   START: Aspirin 81 mg Take 1 tab daily START: Lipitor(atorvastatin) 20 Mg Take 1 tab daily  Nitroglycerin 0.4 mg sublingual (under your tongue) as needed for chest pain. If experiencing chest pain, stop what you are doing and sit down. Take 1 nitroglycerin and wait 5 minutes. If chest pain continues, take another nitroglycerin and wait 5 minutes. If chest pain does not subside, take 1 more nitroglycerin and dial 911. You make take a total of 3 nitroglycerin in a 15 minute time frame.   *If you need a refill on your cardiac medications before your next appointment, please call your pharmacy*   Lab Work: Your physician recommends that you return for lab work in: BEFORE NEXT VISIT Fasting Lipids   If you have labs (blood work) drawn today and your tests are completely normal, you will receive your results only by: Marland Kitchen MyChart Message (if you have MyChart) OR . A paper copy in the mail If you have any lab test that is abnormal or we need to change your treatment, we will call you to review the results.   Testing/Procedures: None   Follow-Up: At [facility], you and your health needs are our priority.  As part of our continuing mission to provide you with exceptional heart care, we have created designated Provider Care Teams.  These Care Teams include your primary Cardiologist (physician) and Advanced Practice Providers (APPs -  Physician Assistants and Nurse Practitioners) who all work together to provide you with the care you need, when you need it.  We recommend signing up for the  patient portal called "MyChart".  Sign up information is provided on this After Visit Summary.  MyChart is used to connect with patients for Virtual  Visits (Telemedicine).  Patients are able to view lab/test results, encounter notes, upcoming appointments, etc.  Non-urgent messages can be sent to your provider as well.   To learn more about what you can do with MyChart, go to [email or web address].    Your next appointment:   3 month(s)  The format for your next appointment:   In Person  Provider:   [name], DO   Other Instructions      Adopting a Healthy Lifestyle.  Know what a healthy weight is for you (roughly BMI <25) and aim to maintain this   Aim for 7+ servings of fruits and vegetables daily   65-80+ fluid ounces of water or unsweet tea for healthy kidneys   Limit to max 1 drink of alcohol per day; avoid smoking/tobacco   Limit animal fats in diet for cholesterol and heart health - choose grass fed whenever available   Avoid highly processed foods, and foods high in saturated/trans fats   Aim for low stress - take time to unwind and care for your mental health   Aim for 150 min of moderate intensity exercise weekly for heart health, and weights twice weekly for bone health   Aim for 7-9 hours of sleep daily   When it comes to diets, agreement about the perfect plan isnt easy to find, even among the experts. Experts at the [facility] [employer] developed an idea known as the Healthy Eating Plate. Just imagine a plate divided into logical, healthy portions.   The emphasis is on diet quality:   Load up on vegetables and fruits - one-half of your plate: Aim for color and variety, and remember that potatoes dont count.   Go for whole grains - one-quarter of your plate: Whole wheat, barley, wheat berries, quinoa, oats, brown rice, and foods made with them. If you want pasta, go with whole wheat pasta.   Protein power - one-quarter of your plate: Fish,  chicken, beans, and nuts are all healthy, versatile protein sources. Limit red meat.   The diet, however, does go beyond the plate, offering a few other suggestions.   Use healthy plant oils, such as olive, canola, soy, corn, sunflower and peanut. Check the labels, and avoid partially hydrogenated oil, which have unhealthy trans fats.   If youre thirsty, drink water. Coffee and tea are good in moderation, but skip sugary drinks and limit milk and dairy products to one or two daily servings.   The type of carbohydrate in the diet is more important than the amount. Some sources of carbohydrates, such as vegetables, fruits, whole grains, and beans-are healthier than others.   Finally, stay active  Signed, Darren Ripple, DO  11/24/2019 2:21 PM    [facility]

## 2019-12-18 ENCOUNTER — Other Ambulatory Visit: Payer: Self-pay | Admitting: Family

## 2020-02-23 ENCOUNTER — Encounter: Payer: Self-pay | Admitting: Cardiology

## 2020-03-23 ENCOUNTER — Ambulatory Visit: Payer: Managed Care, Other (non HMO) | Admitting: Cardiology

## 2020-04-06 ENCOUNTER — Other Ambulatory Visit: Payer: Self-pay | Admitting: Family

## 2020-04-14 ENCOUNTER — Encounter: Payer: Self-pay | Admitting: Cardiology

## 2020-04-14 ENCOUNTER — Ambulatory Visit: Payer: 59 | Admitting: Cardiology

## 2020-04-14 ENCOUNTER — Other Ambulatory Visit: Payer: Self-pay

## 2020-04-14 VITALS — BP 120/72 | HR 81 | Ht 68.0 in | Wt 166.0 lb

## 2020-04-14 DIAGNOSIS — E785 Hyperlipidemia, unspecified: Secondary | ICD-10-CM | POA: Diagnosis not present

## 2020-04-14 DIAGNOSIS — Z8679 Personal history of other diseases of the circulatory system: Secondary | ICD-10-CM

## 2020-04-14 DIAGNOSIS — I2510 Atherosclerotic heart disease of native coronary artery without angina pectoris: Secondary | ICD-10-CM | POA: Diagnosis not present

## 2020-04-14 DIAGNOSIS — I10 Essential (primary) hypertension: Secondary | ICD-10-CM

## 2020-04-14 NOTE — Patient Instructions (Signed)

## 2020-04-14 NOTE — Progress Notes (Signed)
Cardiology Office Note:    Date:  04/15/2020   ID:  Darren Scott, DOB 05/08/1971, MRN [id number]  PCP:  [name], [name], NP  Cardiologist:  Thomasene RippleKardie Moncerrath Berhe, DO  Electrophysiologist:  None   Referring MD: [name], [name], NP   I am doing fine  History of Present Illness:    Darren Scott is a 49 y.o. male with a hx of  Hypertension, hyperlipidemia, coronary artery disease diagnosed by coronary CTA presents today for follow-up visit.  I last saw the patient March 2021 at that time   I did educated patient about his new diagnosis.  Clinically he was doing well with therefore started patient on aspirin 81 mg daily and atorvastatin 20 mg daily.    Today he tells me he has no complaints.  He has semi retired and he is working for himself and he feels very good about this and tells me his stress has reduced  Tremendously.  Past Medical History:  Diagnosis Date  . Acute bronchitis 08/16/2014  . Atypical chest pain 02/20/2018  . Dyslipidemia 02/20/2018  . History of hypertension 12/29/2013  . Hyperglycemia 12/29/2013  . Hyperlipidemia   . Hypertension   . Other and unspecified hyperlipidemia 12/29/2013  . Routine general medical examination at a health care facility 12/29/2013    Past Surgical History:  Procedure Laterality Date  . KNEE SURGERY Left 2004    Current Medications: Current Meds  Medication Sig  . amLODipine (NORVASC) 10 MG tablet TAKE 1 TABLET BY MOUTH EVERY DAY  . aspirin EC 81 MG tablet Take 1 tablet (81 mg total) by mouth daily.  Marland Kitchen. atorvastatin (LIPITOR) 20 MG tablet Take 1 tablet (20 mg total) by mouth daily.  . metoprolol succinate (TOPROL-XL) 50 MG 24 hr tablet TAKE 1 TABLET (50 MG TOTAL) BY MOUTH DAILY. TAKE WITH OR IMMEDIATELY FOLLOWING A MEAL.  . nitroGLYCERIN (NITROSTAT) 0.4 MG SL tablet Place 1 tablet (0.4 mg total) under the tongue every 5 (five) minutes as needed.     Allergies:   Patient has no known allergies.   Social History   Socioeconomic  History  . Marital status: Single    Spouse name: Not on file  . Number of children: Not on file  . Years of education: Not on file  . Highest education level: Not on file  Occupational History  . Not on file  Tobacco Use  . Smoking status: Never Smoker  . Smokeless tobacco: Current User    Types: Chew  Vaping Use  . Vaping Use: Never used  Substance and Sexual Activity  . Alcohol use: Yes    Alcohol/week: 0.0 standard drinks    Comment: occ  . Drug use: No  . Sexual activity: Not on file  Other Topics Concern  . Not on file  Social History Narrative   3 children 49 yr old son, 49 yr old daughter, son age 386   Separated, has joint custody.  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 Difficulty of Paying Living Expenses:   Food Insecurity:   . Worried About Programme researcher, broadcasting/film/videounning Out of Food in the Last Year:   . Baristaan Out of Food in the Last Year:   Transportation Needs:   . Freight forwarderLack of Transportation (Medical):   Marland Kitchen. Lack of Transportation (Non-Medical):   Physical  Activity:   . Days of Exercise per Week:   . Minutes of Exercise per Session:   Stress:   . Feeling of Stress :   Social Connections:   . Frequency of Communication with Friends and Family:   . Frequency of Social Gatherings with Friends and Family:   . Attends Religious Services:   . Active Member of Clubs or Organizations:   . Attends Banker Meetings:   Marland Kitchen Marital Status:      Family History: The patient's family history includes Heart attack in his father. There is no history of Cancer or Diabetes.  ROS:   Review of Systems  Constitution: Negative for decreased appetite, fever and weight gain.  HENT: Negative for congestion, ear discharge, hoarse voice and sore throat.   Eyes: Negative for discharge, redness, vision loss in right eye and  visual halos.  Cardiovascular: Negative for chest pain, dyspnea on exertion, leg swelling, orthopnea and palpitations.  Respiratory: Negative for cough, hemoptysis, shortness of breath and snoring.   Endocrine: Negative for heat intolerance and polyphagia.  Hematologic/Lymphatic: Negative for bleeding problem. Does not bruise/bleed easily.  Skin: Negative for flushing, nail changes, rash and suspicious lesions.  Musculoskeletal: Negative for arthritis, joint pain, muscle cramps, myalgias, neck pain and stiffness.  Gastrointestinal: Negative for abdominal pain, bowel incontinence, diarrhea and excessive appetite.  Genitourinary: Negative for decreased libido, genital sores and incomplete emptying.  Neurological: Negative for brief paralysis, focal weakness, headaches and loss of balance.  Psychiatric/Behavioral: Negative for altered mental status, depression and suicidal ideas.  Allergic/Immunologic: Negative for HIV exposure and persistent infections.    EKGs/Labs/Other Studies Reviewed:    The following studies were reviewed today:   EKG:    None today  Recent Labs: 08/10/2019: ALT 25; Hemoglobin 14.6; Platelets 230.0; TSH 0.89 10/07/2019: BUN 15; Creatinine, Ser 1.10; Potassium 3.9; Sodium 139  Recent Lipid Panel    Component Value Date/Time   CHOL 219 (H) 08/10/2019 1531   TRIG 272.0 (H) 08/10/2019 1531   HDL 38.10 (L) 08/10/2019 1531   CHOLHDL 6 08/10/2019 1531   VLDL 54.4 (H) 08/10/2019 1531   LDLCALC 139 (H) 03/19/2018 1003   LDLDIRECT 124.0 08/10/2019 1531    Physical Exam:    VS:  BP 120/72 (BP Location: Left Arm, Patient Position: Sitting)   Pulse 81   Ht 5\' 8"  (1.727 m)   Wt 166 lb (75.3 kg)   SpO2 97%   BMI 25.24 kg/m     Wt Readings from Last 3 Encounters:  04/14/20 166 lb (75.3 kg)  11/24/19 164 lb (74.4 kg)  11/17/19 161 lb (73 kg)     GEN: Well nourished, well developed in no acute distress HEENT: Normal NECK: No JVD; No carotid bruits LYMPHATICS:  No lymphadenopathy CARDIAC: S1S2 noted,RRR, no murmurs, rubs, gallops RESPIRATORY:  Clear to auscultation without rales, wheezing or rhonchi  ABDOMEN: Soft, non-tender, non-distended, +bowel sounds, no guarding. EXTREMITIES: No edema, No cyanosis, no clubbing MUSCULOSKELETAL:  No deformity  SKIN: Warm and dry NEUROLOGIC:  Alert and oriented x 3, non-focal PSYCHIATRIC:  Normal affect, good insight  ASSESSMENT:    1. Essential hypertension   2. Dyslipidemia   3. Coronary artery disease involving native coronary artery of native heart without angina pectoris    PLAN:     1. He appears clinically stable from a cardiovascular standpoint.  He tells me that he is taking his medication as prescribed.  2.  Coronary artery disease -no angina symptoms continue patient  his aspirin and atorvastatin.    Three.  Hypertension his blood pressure is acceptable in the office there has been some improvement in his diet.  He is happy with his lifestyle change giving now he works for himself and he has very less stressed.  The patient is in agreement with the above plan. The patient left the office in stable condition.  The patient will follow up in  One year sooner if needed.   Medication Adjustments/Labs and Tests Ordered: Current medicines are reviewed at length with the patient today.  Concerns regarding medicines are outlined above.  Orders Placed This Encounter  Procedures  . EKG 12-Lead   No orders of the defined types were placed in this encounter.   Patient Instructions  Medication Instructions:  Your physician recommends that you continue on your current medications as directed. Please refer to the Current Medication list given to you today.  *If you need a refill on your cardiac medications before your next appointment, please call your pharmacy*   Lab Work: None If you have labs (blood work) drawn today and your tests are completely normal, you will receive your results only  by: Marland Kitchen MyChart Message (if you have MyChart) OR . A paper copy in the mail If you have any lab test that is abnormal or we need to change your treatment, we will call you to review the results.   Testing/Procedures: None   Follow-Up: At [facility], you and your health needs are our priority.  As part of our continuing mission to provide you with exceptional heart care, we have created designated Provider Care Teams.  These Care Teams include your primary Cardiologist (physician) and Advanced Practice Providers (APPs -  Physician Assistants and Nurse Practitioners) who all work together to provide you with the care you need, when you need it.  We recommend signing up for the patient portal called "MyChart".  Sign up information is provided on this After Visit Summary.  MyChart is used to connect with patients for Virtual Visits (Telemedicine).  Patients are able to view lab/test results, encounter notes, upcoming appointments, etc.  Non-urgent messages can be sent to your provider as well.   To learn more about what you can do with MyChart, go to [email or web address].    Your next appointment:   1 year(s)  The format for your next appointment:   In Person  Provider:   [name], DO   Other Instructions      Adopting a Healthy Lifestyle.  Know what a healthy weight is for you (roughly BMI <25) and aim to maintain this   Aim for 7+ servings of fruits and vegetables daily   65-80+ fluid ounces of water or unsweet tea for healthy kidneys   Limit to max 1 drink of alcohol per day; avoid smoking/tobacco   Limit animal fats in diet for cholesterol and heart health - choose grass fed whenever available   Avoid highly processed foods, and foods high in saturated/trans fats   Aim for low stress - take time to unwind and care for your mental health   Aim for 150 min of moderate intensity exercise weekly for heart health, and weights twice weekly for bone health   Aim for  7-9 hours of sleep daily   When it comes to diets, agreement about the perfect plan isnt easy to find, even among the experts. Experts at the [facility] [employer] developed an idea known as the Healthy Eating Plate. Just  imagine a plate divided into logical, healthy portions.   The emphasis is on diet quality:   Load up on vegetables and fruits - one-half of your plate: Aim for color and variety, and remember that potatoes dont count.   Go for whole grains - one-quarter of your plate: Whole wheat, barley, wheat berries, quinoa, oats, brown rice, and foods made with them. If you want pasta, go with whole wheat pasta.   Protein power - one-quarter of your plate: Fish, chicken, beans, and nuts are all healthy, versatile protein sources. Limit red meat.   The diet, however, does go beyond the plate, offering a few other suggestions.   Use healthy plant oils, such as olive, canola, soy, corn, sunflower and peanut. Check the labels, and avoid partially hydrogenated oil, which have unhealthy trans fats.   If youre thirsty, drink water. Coffee and tea are good in moderation, but skip sugary drinks and limit milk and dairy products to one or two daily servings.   The type of carbohydrate in the diet is more important than the amount. Some sources of carbohydrates, such as vegetables, fruits, whole grains, and beans-are healthier than others.   Finally, stay active  Signed, [name], DO  04/15/2020 12:36 PM    [facility]

## 2020-06-23 ENCOUNTER — Other Ambulatory Visit: Payer: Self-pay | Admitting: Family

## 2020-07-24 ENCOUNTER — Telehealth (INDEPENDENT_AMBULATORY_CARE_PROVIDER_SITE_OTHER): Payer: 59 | Admitting: Family

## 2020-07-24 ENCOUNTER — Other Ambulatory Visit: Payer: Self-pay

## 2020-07-24 DIAGNOSIS — J309 Allergic rhinitis, unspecified: Secondary | ICD-10-CM

## 2020-07-24 MED ORDER — FLUTICASONE PROPIONATE 50 MCG/ACT NA SUSP: 2.0000 | Freq: Every day | NASAL | 6 refills | Status: DC

## 2020-07-24 MED ORDER — CETIRIZINE HCL 10 MG PO TABS: 10.0000 mg | ORAL_TABLET | Freq: Every day | ORAL | 11 refills | Status: DC

## 2020-07-24 NOTE — Progress Notes (Signed)
Virtual Visit via Video Note  I connected with Darren Scott on 07/24/20 at 12:40 PM EDT by a video enabled telemedicine application and verified that I am speaking with the correct person using two identifiers.  Location: Patient: work Restaurant manager, fast food: work   I discussed the limitations of evaluation and management by telemedicine and the availability of in person appointments. The patient expressed understanding and agreed to proceed. Only the patient and myself were present for today's video call.   History of Present Illness:  Reports that he had a sinus infection last week.  Did mowing and did not wear his mask.  Reports that these symptoms resolved.  Reports that he can only breath out of one nare at a time. He had a 4 day course of abx which he feels cleared it up. Reports frontal HA daily.     Observations/Objective:   Gen: Awake, alert, no acute distress Resp: Breathing is even and non-labored Psych: calm/pleasant demeanor Neuro: Alert and Oriented x 3, + facial symmetry, speech is clear.   Assessment and Plan:  Allergic rhinitis- requesting referral to ENT. Referral placed. In addition, I have advised the following:  Add zyrtec 10mg  once daily Add flonase 2 sprays each nostril once daily.     Follow Up Instructions:    I discussed the assessment and treatment plan with the patient. The patient was provided an opportunity to ask questions and all were answered. The patient agreed with the plan and demonstrated an understanding of the instructions.   The patient was advised to call back or seek an in-person evaluation if the symptoms worsen or if the condition fails to improve as anticipated.  , NP

## 2020-07-26 ENCOUNTER — Other Ambulatory Visit: Payer: Self-pay | Admitting: Family

## 2020-07-28 ENCOUNTER — Other Ambulatory Visit: Payer: Self-pay | Admitting: Family

## 2020-08-02 ENCOUNTER — Encounter: Payer: Self-pay | Admitting: Family

## 2020-08-10 ENCOUNTER — Other Ambulatory Visit: Payer: Self-pay

## 2020-08-10 ENCOUNTER — Ambulatory Visit (INDEPENDENT_AMBULATORY_CARE_PROVIDER_SITE_OTHER): Payer: 59 | Admitting: Otolaryngology

## 2020-08-10 ENCOUNTER — Other Ambulatory Visit: Payer: Self-pay | Admitting: Cardiology

## 2020-08-10 ENCOUNTER — Encounter (INDEPENDENT_AMBULATORY_CARE_PROVIDER_SITE_OTHER): Payer: Self-pay | Admitting: Otolaryngology

## 2020-08-10 VITALS — Temp 97.2°F

## 2020-08-10 DIAGNOSIS — J342 Deviated nasal septum: Secondary | ICD-10-CM

## 2020-08-10 DIAGNOSIS — J343 Hypertrophy of nasal turbinates: Secondary | ICD-10-CM

## 2020-08-10 DIAGNOSIS — J310 Chronic rhinitis: Secondary | ICD-10-CM

## 2020-08-10 DIAGNOSIS — Z8709 Personal history of other diseases of the respiratory system: Secondary | ICD-10-CM

## 2020-08-10 NOTE — Progress Notes (Signed)
HPI: Darren Scott is a 49 y.o. male who presents is referred by [name], NP for evaluation of nasal sinus symptoms. He complains of chronic problems with nasal congestion which is worse at night. He also has intermittent nasal congestion during the daytime as well as a lot of mucus production. He occasionally gets headaches. He mostly has clear mucus discharge from his nose. He has used Flonase intermittently but not on a continuous basis. He occasionally uses Vicks decongestant spray but only once or twice a week. He also uses saline irrigation which seems to help the most.. His nasal congestion alternates from side to side with the more dependent side being more congested at night.  He has been treated with several rounds of antibiotics over the past couple months.  Past Medical History:  Diagnosis Date  . Acute bronchitis 08/16/2014  . Atypical chest pain 02/20/2018  . Dyslipidemia 02/20/2018  . History of hypertension 12/29/2013  . Hyperglycemia 12/29/2013  . Hyperlipidemia   . Hypertension   . Other and unspecified hyperlipidemia 12/29/2013  . Routine general medical examination at a health care facility 12/29/2013   Past Surgical History:  Procedure Laterality Date  . KNEE SURGERY Left 2004   Social History   Socioeconomic History  . Marital status: Single    Spouse name: Not on file  . Number of children: Not on file  . Years of education: Not on file  . Highest education level: Not on file  Occupational History  . Not on file  Tobacco Use  . Smoking status: Never Smoker  . Smokeless tobacco: Current User    Types: Chew  Vaping Use  . Vaping Use: Never used  Substance and Sexual Activity  . Alcohol use: Yes    Alcohol/week: 0.0 standard drinks    Comment: occ  . Drug use: No  . Sexual activity: Not on file  Other Topics Concern  . Not on file  Social History Narrative   3 children 11 yr old son, 58 yr old daughter, son age 56   Separated, has joint custody.   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 Difficulty of Paying Living Expenses: Not on file  Food Insecurity:   . Worried About Programme researcher, broadcasting/film/video in the Last Year: Not on file  . Ran Out of Food in the Last Year: Not on file  Transportation Needs:   . Lack of Transportation (Medical): Not on file  . Lack of Transportation (Non-Medical): Not on file  Physical Activity:   . Days of Exercise per Week: Not on file  . Minutes of Exercise per Session: Not on file  Stress:   . Feeling of Stress : Not on file  Social Connections:   . Frequency of Communication with Friends and Family: Not on file  . Frequency of Social Gatherings with Friends and Family: Not on file  . Attends Religious Services: Not on file  . Active Member of Clubs or Organizations: Not on file  . Attends Banker Meetings: Not on file  . Marital Status: Not on file   Family History  Problem Relation Age of Onset  . Heart attack Father        died in his 71's, smoker  . Cancer Neg Hx   . Diabetes Neg  Hx    No Known Allergies Prior to Admission medications   Medication Sig Start Date End Date Taking? Authorizing Provider  amLODipine (NORVASC) 10 MG tablet TAKE 1 TABLET BY MOUTH EVERY DAY 07/26/20  Yes [name], NP  aspirin EC 81 MG tablet Take 1 tablet (81 mg total) by mouth daily. 11/24/19  Yes Tobb, Kardie, DO  cetirizine (ZYRTEC) 10 MG tablet Take 1 tablet (10 mg total) by mouth daily. 07/24/20  Yes [name], NP  fluticasone (FLONASE) 50 MCG/ACT nasal spray Place 2 sprays into both nostrils daily. 07/24/20  Yes [name], NP  metoprolol succinate (TOPROL-XL) 50 MG 24 hr tablet TAKE 1 TABLET (50 MG TOTAL) BY MOUTH DAILY. TAKE WITH OR IMMEDIATELY FOLLOWING A MEAL. 04/06/20  Yes [name], NP   atorvastatin (LIPITOR) 20 MG tablet Take 1 tablet (20 mg total) by mouth daily. 11/24/19 04/14/20  Tobb, Kardie, DO  nitroGLYCERIN (NITROSTAT) 0.4 MG SL tablet Place 1 tablet (0.4 mg total) under the tongue every 5 (five) minutes as needed. 11/24/19 04/14/20  Tobb, Kardie, DO     Positive ROS: Otherwise negative  All other systems have been reviewed and were otherwise negative with the exception of those mentioned in the HPI and as above.  Physical Exam: Constitutional: Alert, well-appearing, no acute distress Ears: External ears without lesions or tenderness. Ear canals are clear bilaterally with intact, clear TMs.  Nasal: External nose without lesions. Septum is mildly deformed to the right but has a lot of mucosal swelling within the nasal passages on both sides.. Nasal endoscopy was performed and on nasal endoscopy the middle meatus regions were edematous but clear otherwise with just clear mucus discharge. No polyps were noted. The posterior nasopharynx was clear. Oral: Lips and gums without lesions. Tongue and palate mucosa without lesions. Posterior oropharynx clear. Neck: No palpable adenopathy or masses Respiratory: Breathing comfortably  Skin: No facial/neck lesions or rash noted.  Nasal/sinus endoscopy  Date/Time: 08/10/2020 2:49 PM Performed by: Drema Halon, MD Authorized by: Drema Halon, MD   Consent:    Consent obtained:  Verbal   Consent given by:  Patient Procedure details:    Indications: sino-nasal symptoms     Medication:  Afrin   Instrument: flexible fiberoptic nasal endoscope     Scope location: bilateral nare   Nasal cavity:    Right inferior turbinates: normal     edema     Left inferior turbinates: normal     edema   Septum:    normal     Deviation: deviated to the right     Severity of deviation: mild   Sinus:    Right middle meatus: normal     Left middle meatus: normal     Right nasopharynx: normal     Left nasopharynx: normal    Comments:     On nasal endoscopy patient has a very edematous mucous membranes on both sides of the nasal cavity with slight septal deviation to the right.  Drainage from the middle meatus regions and drainage within the nasal cavity is all clear.  There are no polyps noted.  The nasopharynx is clear.    Assessment: Chronic rhinitis with mild septal deformity and turbinate hypertrophy. Presently no clinical evidence of active infection.  Plan: Reviewed with patient concerning regular use of nasal steroid spray and prescribed Nasacort to compare to the Flonase recommended using 2 sprays each nostril at night when he goes to bed. Discussed with him that he can  use the saline irrigation as much as he wants to do during the daytime. Would limit the use of Vicks decongestant spray to no more than once or twice a week. If he continues to have persistent problems briefly discussed surgical options. But he would need to have a CT scan of his sinuses prior to considering any surgical options. He will initially try the nasal steroid spray on a regular basis and contact us in a month if he is continuing to have problems and we will plan on obtaining a CT scan of the sinuses prior to follow-up visit.   Narda Bonds, MD   CC:

## 2020-08-19 ENCOUNTER — Other Ambulatory Visit: Payer: Self-pay | Admitting: Family

## 2020-09-17 ENCOUNTER — Other Ambulatory Visit: Payer: Self-pay | Admitting: Family

## 2020-10-08 ENCOUNTER — Other Ambulatory Visit: Payer: Self-pay | Admitting: Family

## 2020-12-13 ENCOUNTER — Other Ambulatory Visit: Payer: Self-pay | Admitting: Family

## 2020-12-27 ENCOUNTER — Other Ambulatory Visit: Payer: Self-pay | Admitting: Family

## 2021-02-02 ENCOUNTER — Other Ambulatory Visit: Payer: Self-pay | Admitting: Family

## 2021-02-13 ENCOUNTER — Other Ambulatory Visit: Payer: Self-pay | Admitting: Cardiology

## 2021-02-14 ENCOUNTER — Other Ambulatory Visit: Payer: Self-pay | Admitting: Cardiology

## 2021-02-15 ENCOUNTER — Other Ambulatory Visit: Payer: Self-pay

## 2021-02-15 MED ORDER — ROSUVASTATIN CALCIUM 20 MG PO TABS: 20.0000 mg | ORAL_TABLET | Freq: Every day | ORAL | 3 refills | Status: DC

## 2021-03-05 ENCOUNTER — Other Ambulatory Visit: Payer: Self-pay | Admitting: Cardiology

## 2021-04-13 ENCOUNTER — Other Ambulatory Visit: Payer: Self-pay | Admitting: Family

## 2021-04-21 ENCOUNTER — Other Ambulatory Visit: Payer: Self-pay | Admitting: Family

## 2021-05-07 ENCOUNTER — Other Ambulatory Visit: Payer: Self-pay | Admitting: Family

## 2021-05-13 ENCOUNTER — Other Ambulatory Visit: Payer: Self-pay | Admitting: Family

## 2021-05-15 NOTE — Telephone Encounter (Signed)
Patient advised he needs appointment  for bp follow up and possible labs. He will call back for appointment for next week.

## 2021-06-14 ENCOUNTER — Other Ambulatory Visit: Payer: Self-pay | Admitting: Family

## 2021-06-14 NOTE — Telephone Encounter (Signed)
Refill sent, but pt needs OV please prior to additional refills.

## 2021-06-15 NOTE — Telephone Encounter (Signed)
Spoke w/ Pt- informed him that he is due for visit- he is having some issues with his insurance but hopes to have it settled in the next 1-2 weeks- he will call back to schedule.

## 2021-06-19 ENCOUNTER — Other Ambulatory Visit: Payer: Self-pay | Admitting: Family

## 2021-06-29 ENCOUNTER — Other Ambulatory Visit: Payer: Self-pay

## 2021-06-29 ENCOUNTER — Ambulatory Visit: Payer: BC Managed Care – PPO | Admitting: Family

## 2021-06-29 VITALS — BP 134/87 | HR 74 | Temp 98.5°F | Resp 16 | Wt 171.0 lb

## 2021-06-29 DIAGNOSIS — R739 Hyperglycemia, unspecified: Secondary | ICD-10-CM

## 2021-06-29 DIAGNOSIS — Z8679 Personal history of other diseases of the circulatory system: Secondary | ICD-10-CM

## 2021-06-29 DIAGNOSIS — R519 Headache, unspecified: Secondary | ICD-10-CM | POA: Diagnosis not present

## 2021-06-29 DIAGNOSIS — I10 Essential (primary) hypertension: Secondary | ICD-10-CM

## 2021-06-29 DIAGNOSIS — E785 Hyperlipidemia, unspecified: Secondary | ICD-10-CM | POA: Diagnosis not present

## 2021-06-29 DIAGNOSIS — Z1211 Encounter for screening for malignant neoplasm of colon: Secondary | ICD-10-CM | POA: Diagnosis not present

## 2021-06-29 DIAGNOSIS — G8929 Other chronic pain: Secondary | ICD-10-CM

## 2021-06-29 NOTE — Assessment & Plan Note (Signed)
New. Will refer to neurology for further evaluation.  

## 2021-06-29 NOTE — Assessment & Plan Note (Signed)
Continues crestor 20mg . Continue same. Obtain follow up lipid panel.

## 2021-06-29 NOTE — Progress Notes (Signed)
Subjective:   By signing my name below, I, Lyric Barr-McArthur, attest that this documentation has been prepared under the direction and in the presence of Debbrah Alar, NP, 06/29/2021   Patient ID: Darren Scott, male    DOB: 11-May-1971, 50 y.o.   MRN: [id number]  Chief Complaint  Patient presents with   Hypertension    Here for follow up   Headache    Patient complaining of daily headaches "for about a year"    HPI Patient is in today for an office visit.  Blood Pressure: His blood pressure is within good range during his visit. He is compliant with taking 10 mg amlodopine and 50 mg metoprolol.  BP Readings from Last 3 Encounters:  06/29/21 134/87  04/14/20 120/72  11/24/19 114/86   Headaches: He complains of headaches that have been occurring for a year now. He notes that some days it is a migraine headache and other days it is a mild headache. He denies photophobia or sensitivity to sound and he also denies any dizziness or light headedness with his headaches. He notes that he does get wavy eye sight with his headaches. He takes tylenol and Advil when the headaches are severe. He denies the headache being localized and notes that it is generalized.  Reflux: He is still experiencing reflux and has an appointment with his GI specialist to address this issue.  Colonoscopy: He is due for his first colonoscopy and would like to get set up for one today in the office.  Immunizations: He is not interested in receiving his Shingrix vaccine or his flu vaccine at this time. He is not interested in receiving his Covid-19 vaccine at this time.   Health Maintenance Due  Topic Date Due   Hepatitis C Screening  Never done   COLONOSCOPY (Pts 45-45yr Insurance coverage will need to be confirmed)  Never done    Past Medical History:  Diagnosis Date   Acute bronchitis 08/16/2014   Atypical chest pain 02/20/2018   Dyslipidemia 02/20/2018   History of hypertension 12/29/2013    Hyperglycemia 12/29/2013   Hyperlipidemia    Hypertension    Other and unspecified hyperlipidemia 12/29/2013   Routine general medical examination at a health care facility 12/29/2013    Past Surgical History:  Procedure Laterality Date   KNEE SURGERY Left 2004    Family History  Problem Relation Age of Onset   Heart attack Father        died in his 533's smoker   Cancer Neg Hx    Diabetes Neg Hx     Social History   Socioeconomic History   Marital status: Single    Spouse name: Not on file   Number of children: Not on file   Years of education: Not on file   Highest education level: Not on file  Occupational History   Not on file  Tobacco Use   Smoking status: Never   Smokeless tobacco: Current    Types: Chew  Vaping Use   Vaping Use: Never used  Substance and Sexual Activity   Alcohol use: Yes    Alcohol/week: 0.0 standard drinks    Comment: occ   Drug use: No   Sexual activity: Not on file  Other Topics Concern   Not on file  Social History Narrative   3 children 17yr old son, 153yr old daughter, son age 50  Separated, has joint custody.  Oldest son lives with his first wife in [place]  Works as Arboriculturist- replaces [employer]   Completed 8th grade   Enjoys- hunting/fishing golfing   Social Determinants of Radio broadcast assistant Strain: Not on [employer] Insecurity: Not on file  Transportation Needs: Not on file  Physical Activity: Not on file  Stress: Not on file  Social Connections: Not on file  Intimate Partner Violence: Not on file    Outpatient Medications Prior to Visit  Medication Sig Dispense Refill   amLODipine (NORVASC) 10 MG tablet TAKE 1 TABLET BY MOUTH EVERY DAY 30 tablet 0   aspirin EC 81 MG tablet Take 1 tablet (81 mg total) by mouth daily. 90 tablet 3   cetirizine (ZYRTEC) 10 MG tablet Take 1 tablet (10 mg total) by mouth daily. 30 tablet 11   fluticasone (FLONASE) 50 MCG/ACT nasal spray Place 2 sprays into both nostrils daily. 16 g  6   metoprolol succinate (TOPROL-XL) 50 MG 24 hr tablet TAKE 1 TABLET BY MOUTH DAILY. TAKE WITH OR IMMEDIATELY FOLLOWING A MEAL. 30 tablet 0   rosuvastatin (CRESTOR) 20 MG tablet TAKE 1 TABLET BY MOUTH EVERY DAY 90 tablet 3   nitroGLYCERIN (NITROSTAT) 0.4 MG SL tablet Place 1 tablet (0.4 mg total) under the tongue every 5 (five) minutes as needed. 30 tablet 3   No facility-administered medications prior to visit.    No Known Allergies  Review of Systems  Neurological:  Positive for headaches (chronic, intermittent).      Objective:    Physical Exam Constitutional:      General: He is not in acute distress.    Appearance: Normal appearance. He is not ill-appearing.  HENT:     Head: Normocephalic and atraumatic.     Right Ear: External ear normal.     Left Ear: External ear normal.  Eyes:     Extraocular Movements: Extraocular movements intact.     Pupils: Pupils are equal, round, and reactive to light.  Cardiovascular:     Rate and Rhythm: Normal rate and regular rhythm.     Heart sounds: Normal heart sounds. No murmur heard.   No gallop.  Pulmonary:     Effort: Pulmonary effort is normal. No respiratory distress.     Breath sounds: Normal breath sounds. No wheezing or rales.  Lymphadenopathy:     Cervical: No cervical adenopathy.  Skin:    General: Skin is warm and dry.  Neurological:     Mental Status: He is alert and oriented to person, place, and time.  Psychiatric:        Behavior: Behavior normal.        Judgment: Judgment normal.    BP 134/87 (BP Location: Right Arm, Patient Position: Sitting, Cuff Size: Small)   Pulse 74   Temp 98.5 F (36.9 C) (Oral)   Resp 16   Wt 171 lb (77.6 kg)   SpO2 99%   BMI 26.00 kg/m  Wt Readings from Last 3 Encounters:  06/29/21 171 lb (77.6 kg)  04/14/20 166 lb (75.3 kg)  11/24/19 164 lb (74.4 kg)       Assessment & Plan:   Problem List Items Addressed This Visit       Unprioritized   Hyperglycemia   Relevant  Orders   Hemoglobin A1c   History of hypertension    BP Readings from Last 3 Encounters:  06/29/21 134/87  04/14/20 120/72  11/24/19 114/86  Stable, continue amlodipine 59m and toprol xl 563m       Dyslipidemia  Continues crestor 21m. Continue same. Obtain follow up lipid panel.       Relevant Orders   Lipid panel   Chronic nonintractable headache - Primary    New. Will refer to neurology for further evaluation.       Relevant Orders   Ambulatory referral to Neurology   Other Visit Diagnoses     Colon cancer screening       Relevant Orders   Ambulatory referral to Gastroenterology   Primary hypertension       Relevant Orders   Comp Met (CMET)      No orders of the defined types were placed in this encounter.   I, MDebbrah Alar NP, personally preformed the services described in this documentation.  All medical record entries made by the scribe were at my direction and in my presence.  I have reviewed the chart and discharge instructions (if applicable) and agree that the record reflects my personal performance and is accurate and complete. 06/29/2021  I,Lyric Barr-McArthur,acting as a sEducation administratorfor MNance Pear NP.,have documented all relevant documentation on the behalf of MNance Pear NP,as directed by  MNance Pear NP while in the presence of MNance Pear NP.  MNance Pear NP

## 2021-06-29 NOTE — Assessment & Plan Note (Signed)
BP Readings from Last 3 Encounters:  06/29/21 134/87  04/14/20 120/72  11/24/19 114/86   Stable, continue amlodipine 10mg  and toprol xl 50mg .

## 2021-06-30 LAB — COMPREHENSIVE METABOLIC PANEL
AG Ratio: 2.1 (calc) (ref 1.0–2.5)
ALT: 42 U/L (ref 9–46)
AST: 26 U/L (ref 10–35)
Albumin: 4.9 g/dL (ref 3.6–5.1)
Alkaline phosphatase (APISO): 71 U/L (ref 35–144)
BUN: 16 mg/dL (ref 7–25)
CO2: 31 mmol/L (ref 20–32)
Calcium: 10.1 mg/dL (ref 8.6–10.3)
Chloride: 104 mmol/L (ref 98–110)
Creat: 1.2 mg/dL (ref 0.70–1.30)
Globulin: 2.3 g/dL (calc) (ref 1.9–3.7)
Glucose, Bld: 100 mg/dL — ABNORMAL HIGH (ref 65–99)
Potassium: 4.7 mmol/L (ref 3.5–5.3)
Sodium: 141 mmol/L (ref 135–146)
Total Bilirubin: 0.5 mg/dL (ref 0.2–1.2)
Total Protein: 7.2 g/dL (ref 6.1–8.1)

## 2021-06-30 LAB — LIPID PANEL
Cholesterol: 168 mg/dL (ref <200)
HDL: 37 mg/dL — ABNORMAL LOW (ref >=40)
LDL Cholesterol (Calc): 93 mg/dL (calc)
Non-HDL Cholesterol (Calc): 131 mg/dL (calc) — ABNORMAL HIGH (ref <130)
Total CHOL/HDL Ratio: 4.5 (calc) (ref <5.0)
Triglycerides: 265 mg/dL — ABNORMAL HIGH (ref <150)

## 2021-06-30 LAB — HEMOGLOBIN A1C
Hgb A1c MFr Bld: 5.6 % of total Hgb (ref <5.7)
Mean Plasma Glucose: 114 mg/dL
eAG (mmol/L): 6.3 mmol/L

## 2021-07-02 ENCOUNTER — Other Ambulatory Visit: Payer: Self-pay | Admitting: Family

## 2021-07-03 MED ORDER — METOPROLOL SUCCINATE ER 50 MG PO TB24: 50.0000 mg | ORAL_TABLET | Freq: Every day | ORAL | 1 refills | Status: DC

## 2021-07-05 ENCOUNTER — Encounter: Payer: Self-pay | Admitting: Neurology

## 2021-07-30 ENCOUNTER — Ambulatory Visit (INDEPENDENT_AMBULATORY_CARE_PROVIDER_SITE_OTHER): Payer: BC Managed Care – PPO | Admitting: Orthopaedic Surgery

## 2021-07-30 ENCOUNTER — Other Ambulatory Visit (HOSPITAL_BASED_OUTPATIENT_CLINIC_OR_DEPARTMENT_OTHER): Payer: Self-pay | Admitting: Orthopaedic Surgery

## 2021-07-30 ENCOUNTER — Ambulatory Visit (HOSPITAL_BASED_OUTPATIENT_CLINIC_OR_DEPARTMENT_OTHER)
Admission: RE | Admit: 2021-07-30 | Discharge: 2021-07-30 | Disposition: A | Discharge status: Home / Self Care | Payer: BC Managed Care – PPO | Source: Ambulatory Visit | Attending: Orthopaedic Surgery | Admitting: Orthopaedic Surgery

## 2021-07-30 ENCOUNTER — Other Ambulatory Visit: Payer: Self-pay

## 2021-07-30 DIAGNOSIS — M25562 Pain in left knee: Secondary | ICD-10-CM

## 2021-07-30 DIAGNOSIS — M13862 Other specified arthritis, left knee: Secondary | ICD-10-CM

## 2021-07-30 DIAGNOSIS — M13861 Other specified arthritis, right knee: Secondary | ICD-10-CM

## 2021-07-30 DIAGNOSIS — M25561 Pain in right knee: Secondary | ICD-10-CM | POA: Diagnosis not present

## 2021-07-30 DIAGNOSIS — M1990 Unspecified osteoarthritis, unspecified site: Secondary | ICD-10-CM

## 2021-07-30 IMAGING — DX DG KNEE COMPLETE 4+V*R*
4 series · 4 of 4 positions shown · non-contrast
Comparison: No prior.

CLINICAL DATA: Chronic knee pain.  No known injury.

EXAM:
RIGHT KNEE - COMPLETE 4+ VIEW

[knee ap]
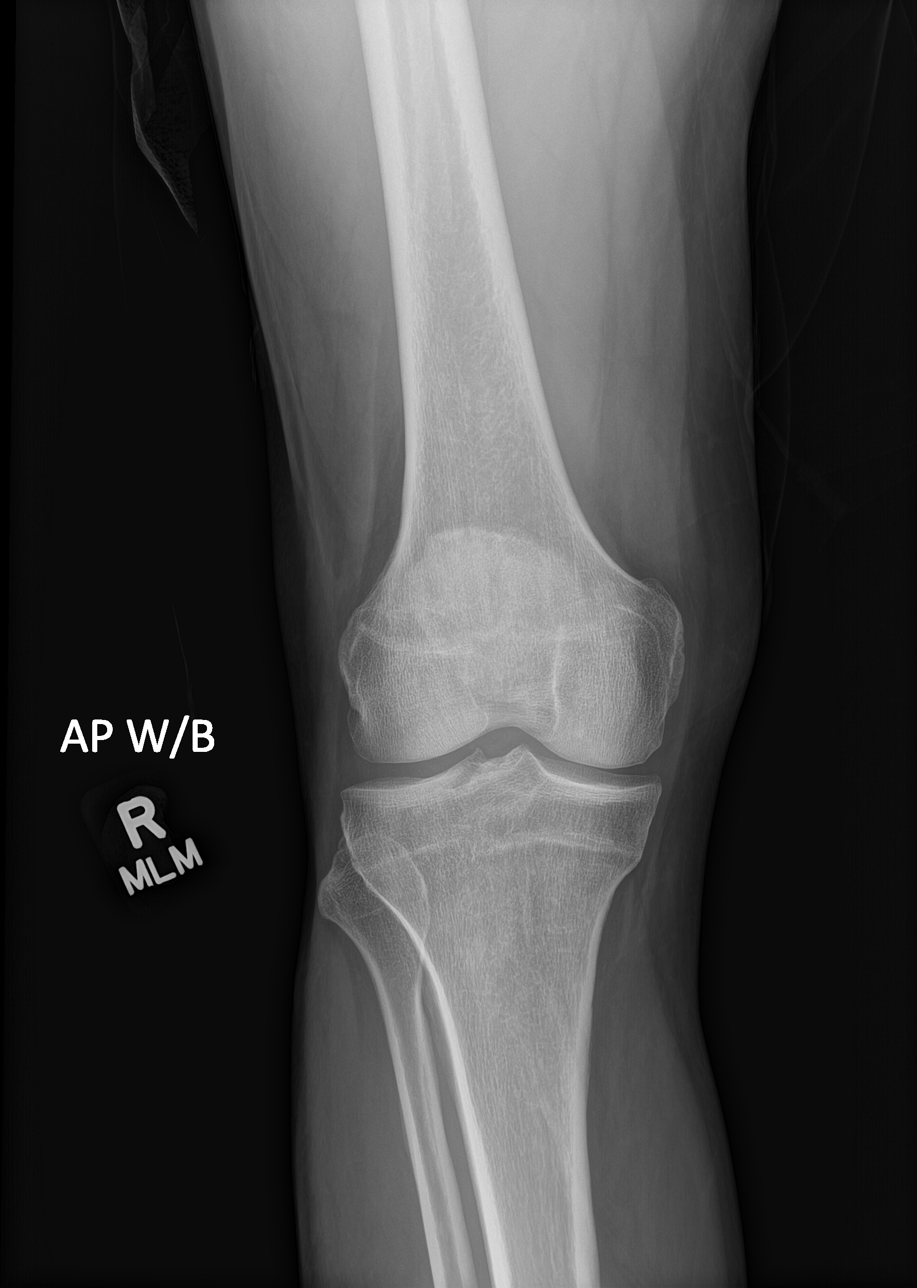

[knee lat]
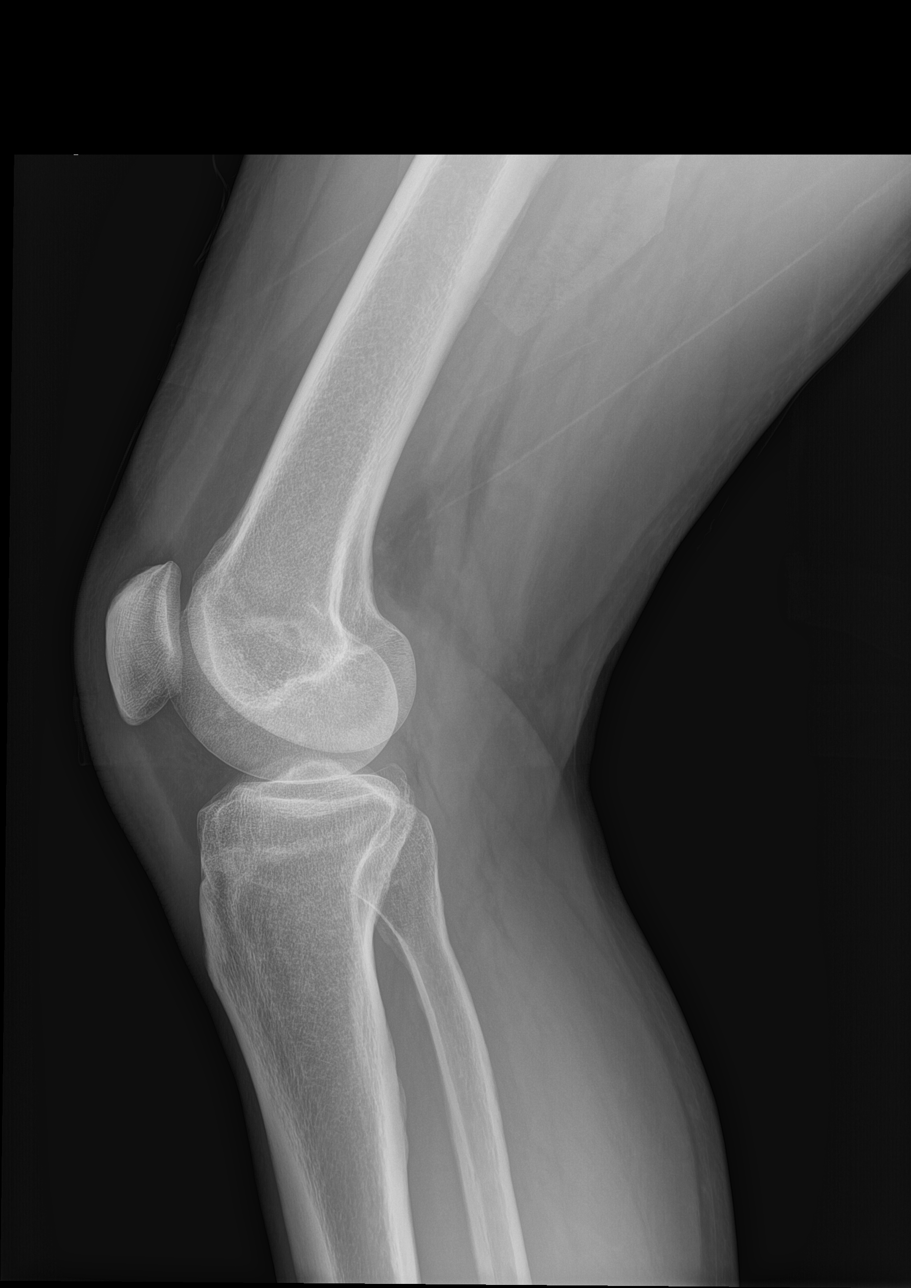

[patella skyline]
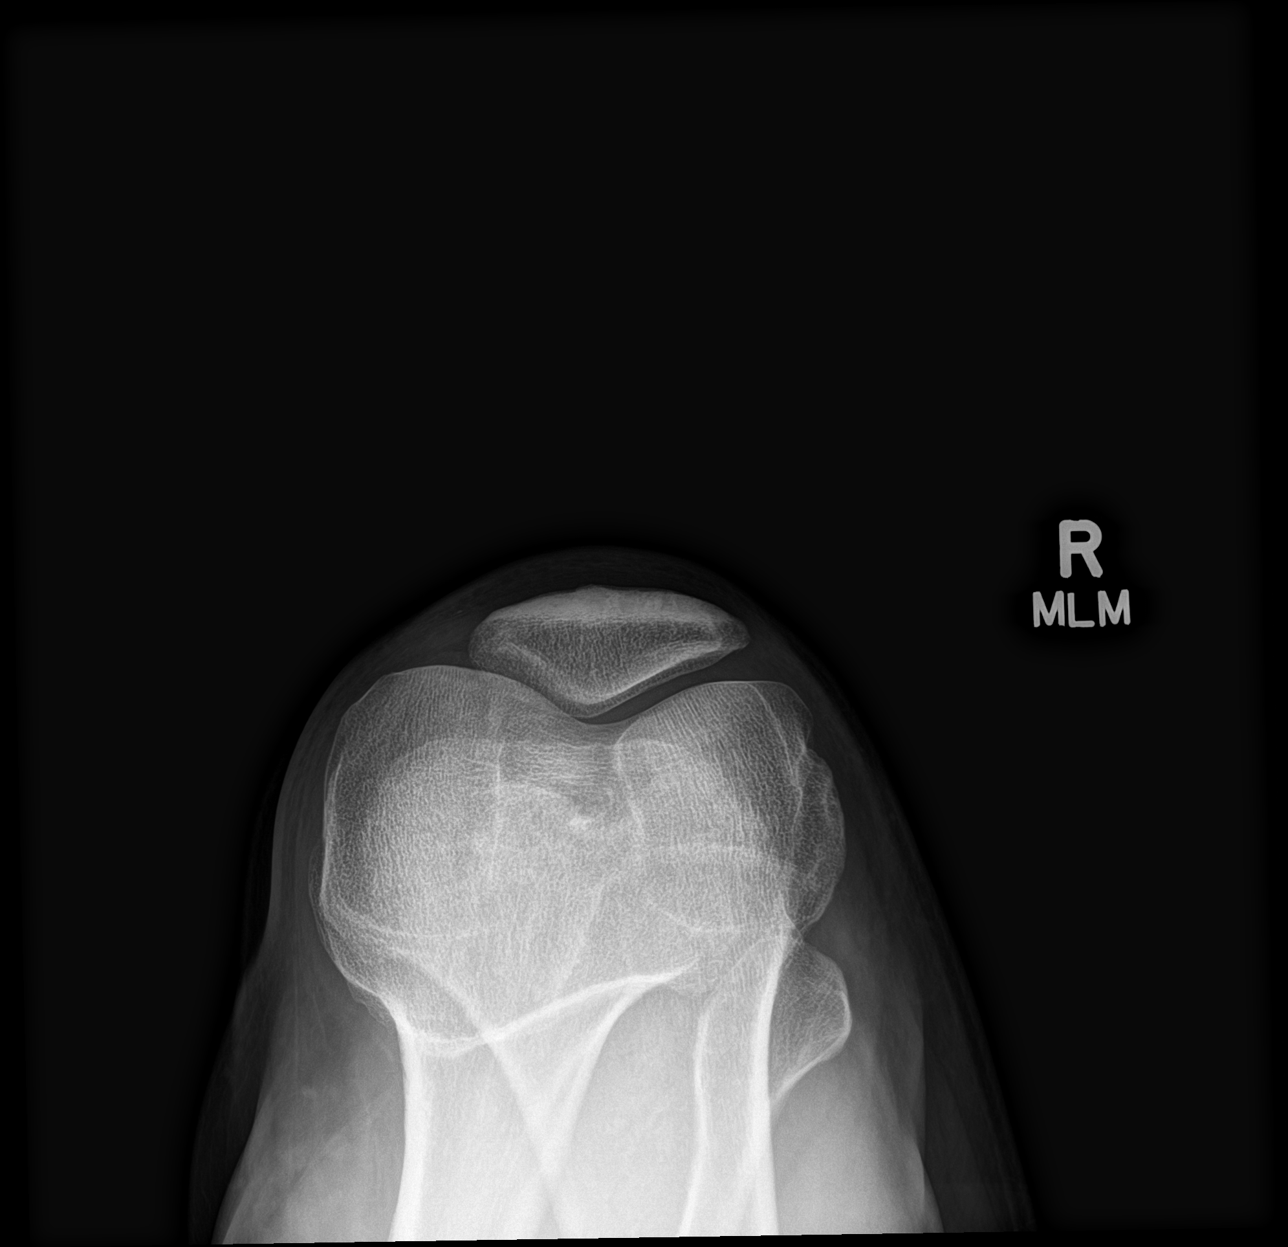

[knee tunnel]
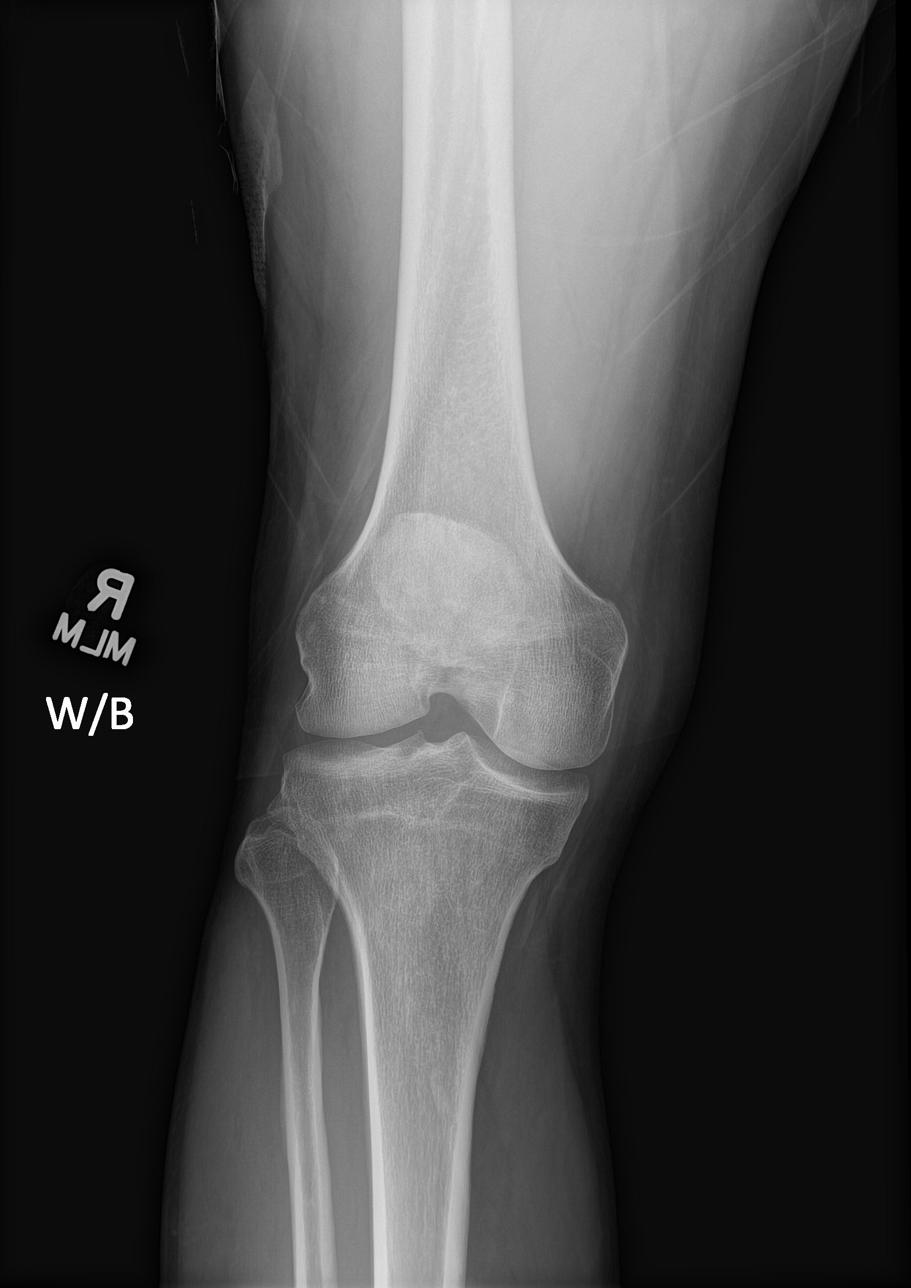

[4 of 4 positions shown; findings below may reference images not displayed]

FINDINGS: Mild medial compartment degenerative change. No acute bony or joint
abnormality. No evidence of fracture or dislocation. No evidence of
effusion.
IMPRESSION: Mild medial compartment degenerative change.  No acute abnormality.

## 2021-07-30 IMAGING — DX DG KNEE COMPLETE 4+V*L*
4 series · 4 of 4 positions shown · non-contrast
Comparison: No prior.

CLINICAL DATA: Chronic knee pain.  No known injury.

EXAM:
LEFT KNEE - COMPLETE 4+ VIEW

[knee ap]
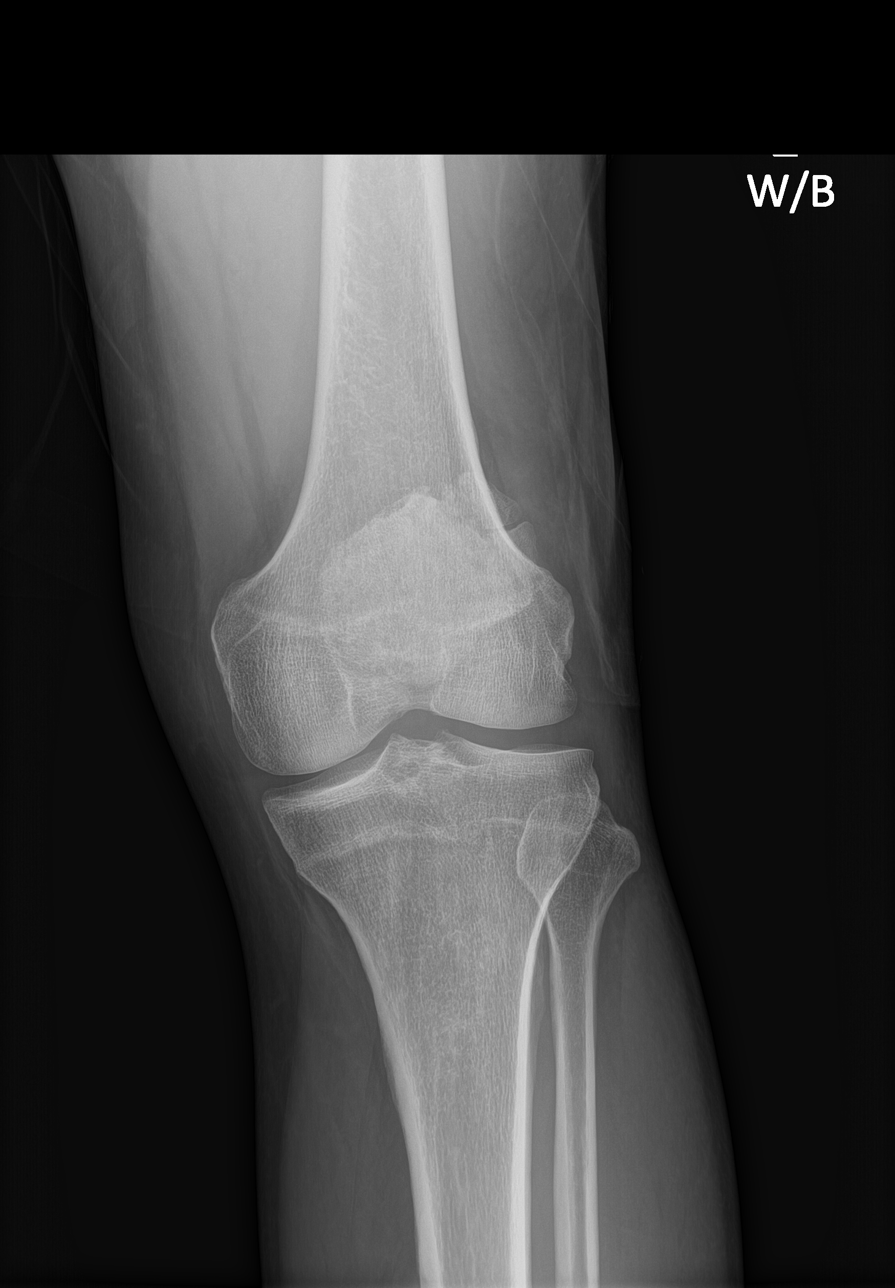

[knee lat]
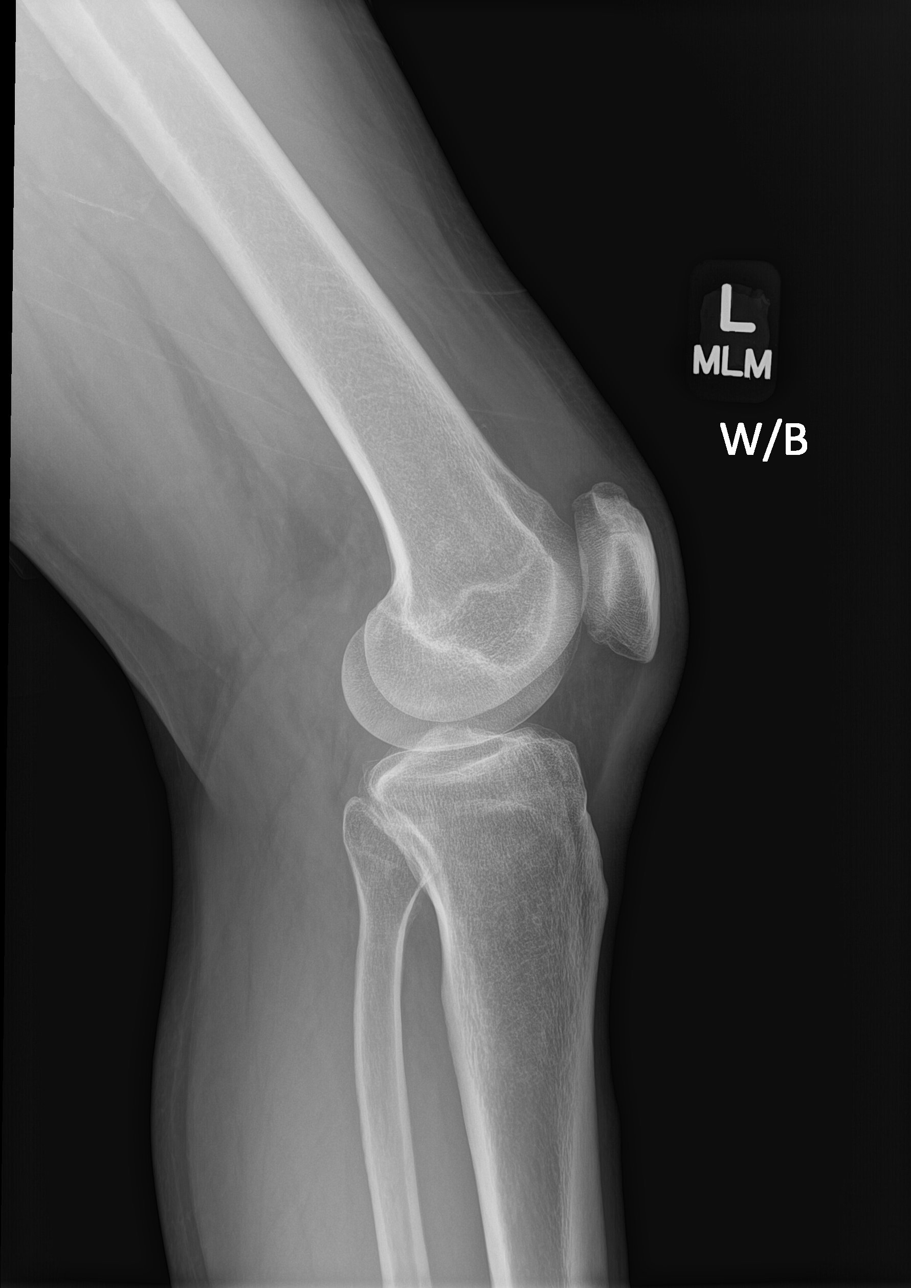

[patella skyline]
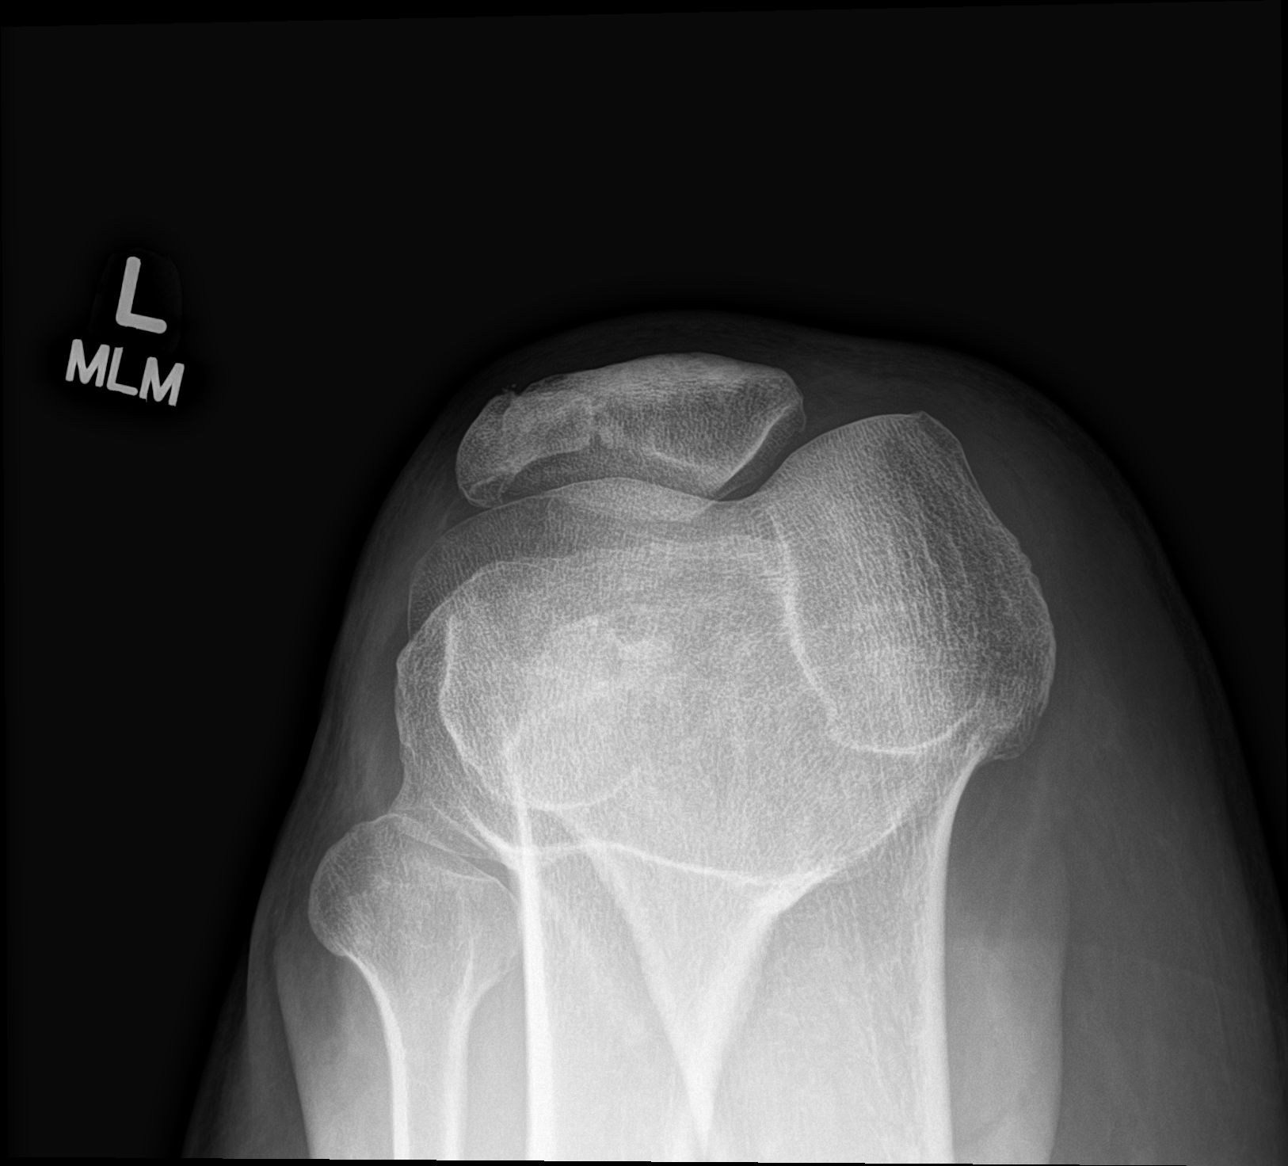

[knee tunnel]
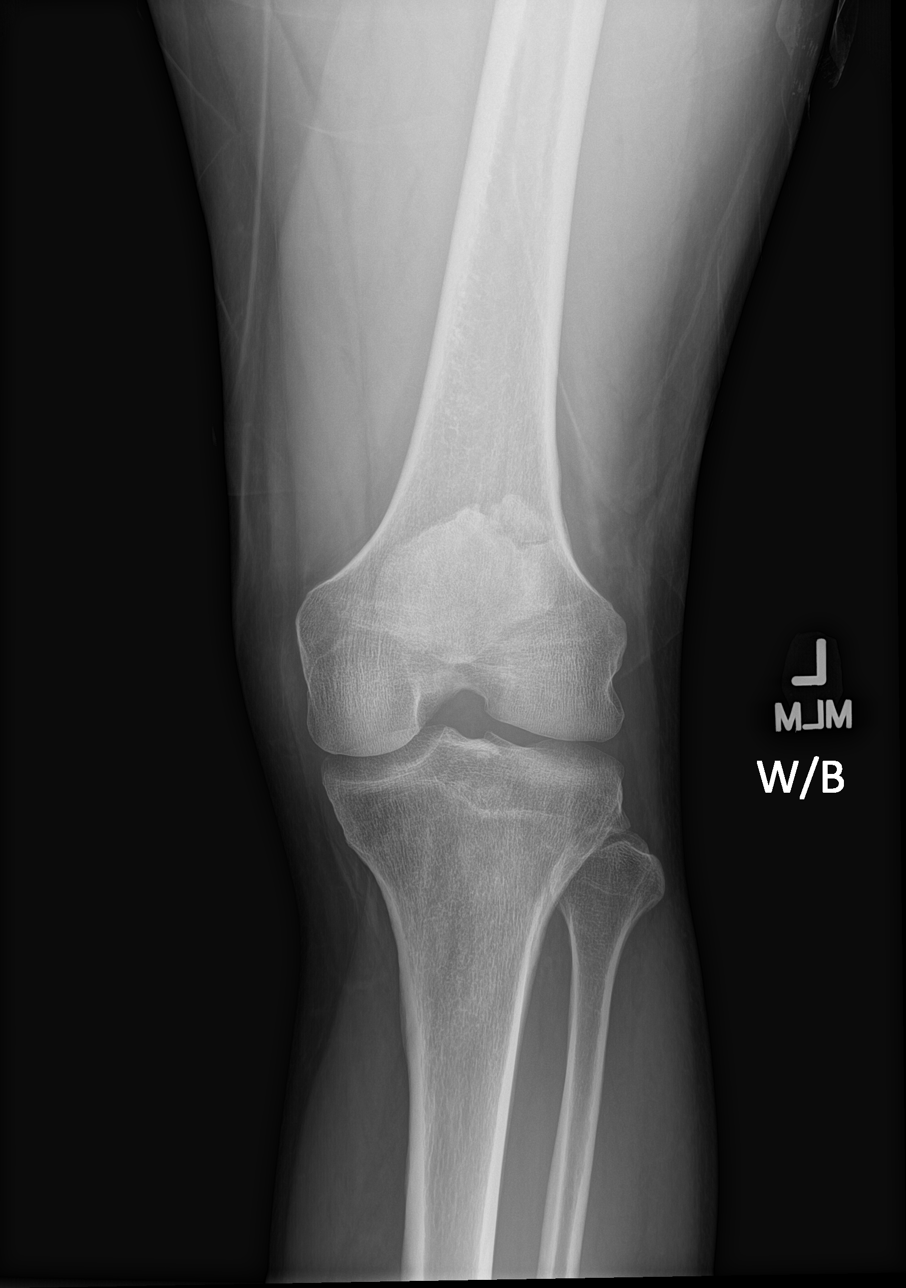

[4 of 4 positions shown; findings below may reference images not displayed]

FINDINGS: Corticated bony density noted along the superolateral aspect of the
patella consistent with bipartite patella or old fracture. Mild
medial compartment and patellofemoral degenerative change. No acute
bony abnormality. No evidence of acute fracture or dislocation. Tiny
knee joint effusion cannot be excluded.
IMPRESSION: 1. Corticated bony density noted along the superolateral aspect of
patella consistent by point DONNIE patella or old patellar fracture.
No acute bony abnormality identified.

2. Mild medial compartment and patellofemoral degenerative change.
3. Tiny knee joint effusion cannot be excluded.

## 2021-07-30 MED ORDER — LIDOCAINE HCL 1 % IJ SOLN
4.0000 mL | INTRAMUSCULAR | Status: AC | PRN
Start: 2021-07-30 — End: 2021-07-30
  Administered 2021-07-30: 4 mL

## 2021-07-30 NOTE — Progress Notes (Signed)
Chief Complaint: bilateral knee pain     History of Present Illness:   Darren Scott is a 50 y.o. male with bilateral left worse than right knee pain.  He states that he is were significantly swollen when he woke up to half weeks prior.  Since that time he has been walking with a limp.  The swelling went down on the right and on the left.  He describes the pain is medial lateral joint line.  He has been taking over-the-counter medications which are not helping.  He is using Biofreeze which does help somewhat.  He does have a history of a arthroscopic knee debridement in 2007 on the left knee which was doing well until this time.    Surgical History:   None  PMH/PSH/Family History/Social History/Meds/Allergies:    Past Medical History:  Diagnosis Date  . Acute bronchitis 08/16/2014  . Atypical chest pain 02/20/2018  . Dyslipidemia 02/20/2018  . History of hypertension 12/29/2013  . Hyperglycemia 12/29/2013  . Hyperlipidemia   . Hypertension   . Other and unspecified hyperlipidemia 12/29/2013  . Routine general medical examination at a health care facility 12/29/2013   Past Surgical History:  Procedure Laterality Date  . KNEE SURGERY Left 2004   Social History   Socioeconomic History  . Marital status: Single    Spouse name: Not on file  . Number of children: Not on file  . Years of education: Not on file  . Highest education level: Not on file  Occupational History  . Not on file  Tobacco Use  . Smoking status: Never  . Smokeless tobacco: Current    Types: Chew  Vaping Use  . Vaping Use: Never used  Substance and Sexual Activity  . Alcohol use: Yes    Alcohol/week: 0.0 standard drinks    Comment: occ  . Drug use: No  . Sexual activity: Not on file  Other Topics Concern  . Not on file  Social History Narrative   3 children 59 yr old son, 55 yr old daughter, son age 16   Separated, has joint custody.  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 Heart attack Father        died in his 2's, smoker  . Cancer Neg Hx   . Diabetes Neg Hx    No Known Allergies Current Outpatient Medications  Medication Sig Dispense Refill  . amLODipine (NORVASC) 10 MG tablet TAKE 1 TABLET BY MOUTH EVERY DAY 30 tablet 0  . aspirin EC 81 MG tablet Take 1 tablet (81 mg total) by mouth daily. 90 tablet 3  . cetirizine (ZYRTEC) 10 MG tablet Take 1 tablet (10 mg total) by mouth daily. 30 tablet 11  . fluticasone (FLONASE) 50 MCG/ACT nasal spray Place 2 sprays into both nostrils daily. 16 g 6  . metoprolol succinate (TOPROL-XL) 50 MG 24 hr tablet Take 1  tablet (50 mg total) by mouth daily. TAKE WITH OR IMMEDIATELY FOLLOWING A MEAL. 90 tablet 1  . nitroGLYCERIN (NITROSTAT) 0.4 MG SL tablet Place 1 tablet (0.4 mg total) under the tongue every 5 (five) minutes as needed. 30 tablet 3  . rosuvastatin (CRESTOR) 20 MG tablet TAKE 1 TABLET BY MOUTH EVERY DAY 90 tablet 3   No current facility-administered medications for this visit.   No results found.  Review of Systems:   A ROS was performed including pertinent positives and negatives as documented in the HPI.  Physical Exam :   Constitutional: NAD and appears stated age Neurological: Alert and oriented Psych: Appropriate affect and cooperative There were no vitals taken for this visit.   Comprehensive Musculoskeletal Exam:    Range of motion of the right knee is 0-135, left knee is from 0-90.  Significant effusion about the left knee.  No pain with range of motion about the left knee.  He is got medial lateral joint  line bilaterally.  Negative Lachman negative ligamentous laxity  Imaging:   Xray (4 views right left knee): There is a bipartite patella on the left but otherwise normal    I personally reviewed and interpreted the radiographs.   Assessment:   50 year old male with bilateral left worse than right knee pain.  I have described that ultimately given that he has had effusions of bilateral knees without any type of injury I do believe that this is more consistent with an inflammatory arthropathy.  I will plan to aspirate the left knee today he has deferred any type of cortisone as he has had injections in the past the use did not work.  I recommended that we inject some lidocaine to help with pain in addition to aspiration on the left.  I will plan for rheumatology referral  Plan :    -Return to clinic in 1 month if needed -Rheumatology referral to assess for inflammatory arthropathy     Procedure Note  Patient: Darren Scott             Date of Birth: 1971-01-26           MRN: [id number]             Visit Date: 07/30/2021  Procedures: Visit Diagnoses:  1. Inflammatory arthritis     Large Joint Inj: L knee on 07/30/2021 10:44 AM Indications: pain Details: 22 G 1.5 in needle, ultrasound-guided anterior approach  Arthrogram: No  Medications: 4 mL lidocaine 1 % Outcome: tolerated well, no immediate complications Procedure, treatment alternatives, risks and benefits explained, specific risks discussed. Consent was given by the patient. Immediately prior to procedure a time out was called to verify the correct patient, procedure, equipment, support staff and site/side marked as required. Patient was prepped and draped in the usual sterile fashion.        I personally saw and evaluated the patient, and participated in the management and treatment plan.  Huel Cote, MD Attending Physician, Orthopedic Surgery  This document was dictated using Dragon voice recognition  software. A reasonable attempt at proof reading has been made to minimize errors.

## 2021-07-31 ENCOUNTER — Other Ambulatory Visit: Payer: Self-pay | Admitting: Family

## 2021-08-01 ENCOUNTER — Ambulatory Visit: Payer: BC Managed Care – PPO | Admitting: Neurology

## 2021-08-01 ENCOUNTER — Other Ambulatory Visit: Payer: Self-pay

## 2021-08-01 ENCOUNTER — Encounter: Payer: Self-pay | Admitting: Neurology

## 2021-08-01 VITALS — BP 135/92 | HR 91 | Ht 68.0 in | Wt 172.8 lb

## 2021-08-01 DIAGNOSIS — G44229 Chronic tension-type headache, not intractable: Secondary | ICD-10-CM | POA: Diagnosis not present

## 2021-08-01 MED ORDER — NORTRIPTYLINE HCL 10 MG PO CAPS: 10.0000 mg | ORAL_CAPSULE | Freq: Every day | ORAL | 5 refills | Status: DC

## 2021-08-01 NOTE — Patient Instructions (Signed)
  Start nortriptyline 10mg  at bedtime.  Contact in 4 weeks with update and we can increase dose if needed. Limit use of pain relievers to no more than 2 days out of the week.  These medications include acetaminophen, NSAIDs (ibuprofen/Advil/Motrin, naproxen/Aleve, triptans (Imitrex/sumatriptan), Excedrin, and narcotics.  This will help reduce risk of rebound headaches. Be aware of common food triggers:  - Caffeine:  coffee, black tea, cola, Mt. Dew  - Chocolate  - Dairy:  aged cheeses (brie, blue, cheddar, gouda, [place], provolone, [place], Swiss, etc), chocolate milk, buttermilk, sour cream, limit eggs and yogurt  - Nuts, peanut butter  - Alcohol  - Cereals/grains:  FRESH breads (fresh bagels, sourdough, doughnuts), yeast productions  - Processed/canned/aged/cured meats (pre-packaged deli meats, hotdogs)  - MSG/glutamate:  soy sauce, flavor enhancer, pickled/preserved/marinated foods  - Sweeteners:  aspartame (Equal, Nutrasweet).  Sugar and Splenda are okay  - Vegetables:  legumes (lima beans, lentils, snow peas, fava beans, pinto peans, peas, garbanzo beans), sauerkraut, onions, olives, pickles  - Fruit:  avocados, bananas, citrus fruit (orange, lemon, grapefruit), mango  - Other:  Frozen meals, macaroni and cheese Routine exercise Stay adequately hydrated (aim for 64 oz water daily) Keep headache diary Maintain proper stress management Maintain proper sleep hygiene Do not skip meals Consider supplements:  magnesium citrate 400mg  daily, riboflavin 400mg  daily, coenzyme Q10 100mg  three times daily.

## 2021-08-01 NOTE — Progress Notes (Signed)
NEUROLOGY CONSULTATION NOTE  Darren Scott MRN: [id number] DOB: 1970/12/03  Referring provider: Debbrah Alar, NP Primary care provider: Debbrah Alar, NP  Reason for consult:  headaches  Assessment/Plan:   Chronic tension-type headache, not intractable  Start nortriptyline 10mg  at bedtime, we can increase to 25mg  at bedtime in 4 weeks if needed. Limit use of pain relievers to no more than 2 days out of week to prevent risk of rebound or medication-overuse headache. Keep headache diary Diet modification:  caffeine cessation, soda cessation, increase water intake Improve sleep hygiene:  provided guide/instructions Follow up in 6 months.   Subjective:  Darren Scott is a 50 year old male with HTN and HLD who presents for headaches.  History supplemented by referring provider's note.  Onset:  Occasional headaches since he was young, became frequent around 2020 Location:  Diffuse Quality:  pressure Intensity:  7/10, 3/10 for mild headaches.  He denies new headache, thunderclap headache or severe headache that wakes him from sleep. Aura:  absent Prodrome:  absent Associated symptoms:  None.  He denies associated visual disturbance, nausea, vomiting, photophobia, phonophobia, osmophobia, autonomic symptoms, unilateral numbness or weakness. Duration:  all day Frequency:  20 days a month (3 are the 7/10 ones) Frequency of abortive medication: Aleve Migraine or Excedrin Migraine 2 days a week Triggers:  no Relieving factors:  rest, try not to move Activity:  movement may aggravate  Saw the eye doctor.  Got a new prescription but no change in headaches.  Current NSAIDS/analgesics:  Aleve Migraine, Excedrin Migraine, Tylenol, ASA 81mg  daily Current triptans:  none Current ergotamine:  none Current anti-emetic:  none Current muscle relaxants:  none Current Antihypertensive medications:  metoprolol succinate, amlodipine Current Antidepressant medications:   none Current Anticonvulsant medications:  none Current anti-CGRP:  none Current Vitamins/Herbal/Supplements:  none Current Antihistamines/Decongestants:  none Other therapy:  none Hormone/birth control:  none   Past NSAIDS/analgesics:  meloxicam Past abortive triptans:  none Past abortive ergotamine:  none Past muscle relaxants:  none Past anti-emetic:  none Past antihypertensive medications:  carvedilol, HCTZ Past antidepressant medications:  none Past anticonvulsant medications:  none Past anti-CGRP:  none Past vitamins/Herbal/Supplements:  none Past antihistamines/decongestants:  Zyrtec, Flonase Other past therapies:  none  Caffeine:  Dr Malachi Bonds or Pepsi once a day, rarely coffee Diet:  V8 juice, milk, Gatorade Exercise:  no due to knee pain Depression:  no; Anxiety:  no Other pain:  bilateral knee pain Sleep:  Trouble falling asleep and staying asleep.  No more than 5 hours of sleep a night.  Naps 1 to 3 times for 1 to 2 hours during the day Family history of headache:  no    PAST MEDICAL HISTORY: Past Medical History:  Diagnosis Date   Acute bronchitis 08/16/2014   Atypical chest pain 02/20/2018   Dyslipidemia 02/20/2018   History of hypertension 12/29/2013   Hyperglycemia 12/29/2013   Hyperlipidemia    Hypertension    Other and unspecified hyperlipidemia 12/29/2013   Routine general medical examination at a health care facility 12/29/2013    PAST SURGICAL HISTORY: Past Surgical History:  Procedure Laterality Date   KNEE SURGERY Left 2004    MEDICATIONS: Current Outpatient Medications on File Prior to Visit  Medication Sig Dispense Refill   amLODipine (NORVASC) 10 MG tablet TAKE 1 TABLET BY MOUTH EVERY DAY 90 tablet 1   aspirin EC 81 MG tablet Take 1 tablet (81 mg total) by mouth daily. 90 tablet 3   cetirizine (ZYRTEC) 10 MG tablet  Take 1 tablet (10 mg total) by mouth daily. 30 tablet 11   fluticasone (FLONASE) 50 MCG/ACT nasal spray Place 2 sprays into both  nostrils daily. 16 g 6   metoprolol succinate (TOPROL-XL) 50 MG 24 hr tablet Take 1 tablet (50 mg total) by mouth daily. TAKE WITH OR IMMEDIATELY FOLLOWING A MEAL. 90 tablet 1   nitroGLYCERIN (NITROSTAT) 0.4 MG SL tablet Place 1 tablet (0.4 mg total) under the tongue every 5 (five) minutes as needed. 30 tablet 3   rosuvastatin (CRESTOR) 20 MG tablet TAKE 1 TABLET BY MOUTH EVERY DAY 90 tablet 3   No current facility-administered medications on file prior to visit.    ALLERGIES: No Known Allergies  FAMILY HISTORY: Family History  Problem Relation Age of Onset   Heart attack Father        died in his 71's, smoker   Cancer Neg Hx    Diabetes Neg Hx     Objective:  Blood pressure (!) 135/92, pulse 91, height 5\' 8"  (1.727 m), weight 172 lb 12.8 oz (78.4 kg), SpO2 94 %. General: No acute distress.  Patient appears well-groomed.   Head:  Normocephalic/atraumatic Eyes:  fundi examined but not visualized Neck: supple, no paraspinal tenderness, full range of motion Back: No paraspinal tenderness Heart: regular rate and rhythm Lungs: Clear to auscultation bilaterally. Vascular: No carotid bruits. Neurological Exam: Mental status: alert and oriented to person, place, and time, recent and remote memory intact, fund of knowledge intact, attention and concentration intact, speech fluent and not dysarthric, language intact. Cranial nerves: CN I: not tested CN II: pupils equal, round and reactive to light, visual fields intact CN III, IV, VI:  full range of motion, no nystagmus, no ptosis CN V: facial sensation intact. CN VII: upper and lower face symmetric CN VIII: hearing intact CN IX, X: gag intact, uvula midline CN XI: sternocleidomastoid and trapezius muscles intact CN XII: tongue midline Bulk & Tone: normal, no fasciculations. Motor:  muscle strength 5/5 throughout Sensation:  Pinprick, temperature and vibratory sensation intact. Deep Tendon Reflexes:  2+ throughout,  toes downgoing.    Finger to nose testing:  Without dysmetria.   Heel to shin:  Without dysmetria.   Gait:  Normal station and stride.  Romberg negative.    Thank you for allowing me to take part in the care of this patient.  , DO  CC: Shon Millet, NP

## 2021-08-06 ENCOUNTER — Telehealth: Payer: Self-pay | Admitting: Family

## 2021-08-06 NOTE — Telephone Encounter (Signed)
OK 

## 2021-08-06 NOTE — Telephone Encounter (Signed)
Pt. Girlfriend is interested in becoming a new pt. She wanted to see if she could be established with Darren Scott. I informed her [name] was not taking new pts but she stated her bf asked at his last appt and she stated she would. Wanting to confirm.  Darren Scott More 02/15/1978

## 2021-08-07 NOTE — Telephone Encounter (Signed)
Let message with Trey Paula, he will have her call us to schedule.

## 2021-08-27 ENCOUNTER — Ambulatory Visit (AMBULATORY_SURGERY_CENTER): Payer: Self-pay

## 2021-08-27 ENCOUNTER — Other Ambulatory Visit: Payer: Self-pay

## 2021-08-27 VITALS — Ht 68.0 in | Wt 173.0 lb

## 2021-08-27 DIAGNOSIS — Z1211 Encounter for screening for malignant neoplasm of colon: Secondary | ICD-10-CM

## 2021-08-27 NOTE — Progress Notes (Signed)
No egg or soy allergy known to patient  No issues known to pt with past sedation with any surgeries or procedures Patient denies ever being told they had issues or difficulty with intubation  No FH of Malignant Hyperthermia Pt is not on diet pills Pt is not on home 02  Pt is not on blood thinners  Pt denies issues with constipation at this time; No A fib or A flutter NO PA's for preps discussed with pt in PV today  Discussed with pt there will be an out-of-pocket cost for prep and that varies from $0 to 70 +  dollars - pt verbalized understanding  Due to the COVID-19 pandemic we are asking patients to follow certain guidelines in PV and the [facility]   Pt aware of COVID protocols and [facility] guidelines

## 2021-08-28 ENCOUNTER — Encounter (HOSPITAL_BASED_OUTPATIENT_CLINIC_OR_DEPARTMENT_OTHER): Payer: Self-pay | Admitting: Orthopaedic Surgery

## 2021-08-29 ENCOUNTER — Other Ambulatory Visit (HOSPITAL_BASED_OUTPATIENT_CLINIC_OR_DEPARTMENT_OTHER): Payer: Self-pay | Admitting: Orthopaedic Surgery

## 2021-08-29 DIAGNOSIS — M1990 Unspecified osteoarthritis, unspecified site: Secondary | ICD-10-CM

## 2021-09-10 ENCOUNTER — Ambulatory Visit (AMBULATORY_SURGERY_CENTER): Payer: Self-pay | Admitting: Internal Medicine

## 2021-09-10 ENCOUNTER — Encounter: Payer: Self-pay | Admitting: Internal Medicine

## 2021-09-10 VITALS — BP 108/81 | HR 75 | Temp 98.6°F | Resp 17 | Ht 68.0 in | Wt 176.0 lb

## 2021-09-10 DIAGNOSIS — Z1211 Encounter for screening for malignant neoplasm of colon: Secondary | ICD-10-CM

## 2021-09-10 MED ORDER — SODIUM CHLORIDE 0.9 % IV SOLN: 500.0000 mL | INTRAVENOUS | Status: DC

## 2021-09-10 NOTE — Patient Instructions (Addendum)
Normal exam, no polyps or cancer seen.  Next routine colonoscopy or other screening test in 10 years - 2032.  I appreciate the opportunity to care for you. Iva Boop, MD, FACG    YOU HAD AN ENDOSCOPIC PROCEDURE TODAY AT THE [facility]:   Refer to the procedure report that was given to you for any specific questions about what was found during the examination.  If the procedure report does not answer your questions, please call your gastroenterologist to clarify.  If you requested that your care partner not be given the details of your procedure findings, then the procedure report has been included in a sealed envelope for you to review at your convenience later.  YOU SHOULD EXPECT: Some feelings of bloating in the abdomen. Passage of more gas than usual.  Walking can help get rid of the air that was put into your GI tract during the procedure and reduce the bloating. If you had a lower endoscopy (such as a colonoscopy or flexible sigmoidoscopy) you may notice spotting of blood in your stool or on the toilet paper. If you underwent a bowel prep for your procedure, you may not have a normal bowel movement for a few days.  Please Note:  You might notice some irritation and congestion in your nose or some drainage.  This is from the oxygen used during your procedure.  There is no need for concern and it should clear up in a day or so.  SYMPTOMS TO REPORT IMMEDIATELY:  Following lower endoscopy (colonoscopy or flexible sigmoidoscopy):  Excessive amounts of blood in the stool  Significant tenderness or worsening of abdominal pains  Swelling of the abdomen that is new, acute  Fever of 100F or higher  For urgent or emergent issues, a gastroenterologist can be reached at any hour by calling [phone]. Do not use MyChart messaging for urgent concerns.    DIET:  We do recommend a small meal at first, but then you may proceed to your regular diet.  Drink plenty of fluids but  you should avoid alcoholic beverages for 24 hours.  ACTIVITY:  You should plan to take it easy for the rest of today and you should NOT DRIVE or use heavy machinery until tomorrow (because of the sedation medicines used during the test).    FOLLOW UP: Our staff will call the number listed on your records 48-72 hours following your procedure to check on you and address any questions or concerns that you may have regarding the information given to you following your procedure. If we do not reach you, we will leave a message.  We will attempt to reach you two times.  During this call, we will ask if you have developed any symptoms of COVID 19. If you develop any symptoms (ie: fever, flu-like symptoms, shortness of breath, cough etc.) before then, please call [phone].  If you test positive for Covid 19 in the 2 weeks post procedure, please call and report this information to [place].     SIGNATURES/CONFIDENTIALITY: You and/or your care partner have signed paperwork which will be entered into your electronic medical record.  These signatures attest to the fact that that the information above on your After Visit Summary has been reviewed and is understood.  Full responsibility of the confidentiality of this discharge information lies with you and/or your care-partner.

## 2021-09-10 NOTE — Op Note (Signed)
[facility] Patient Name: Darren Scott Procedure Date: 09/10/2021 10:45 AM MRN: [id number] Endoscopist: Iva Boop , MD Age: 50 Referring MD:  Date of Birth: 1970-11-04 Gender: Male Account #: [id number] Procedure:                Colonoscopy Indications:              Screening for colorectal malignant neoplasm, This                            is the patient's first colonoscopy Medicines:                Propofol per Anesthesia, Monitored Anesthesia Care Procedure:                Pre-Anesthesia Assessment:                           - Prior to the procedure, a History and Physical                            was performed, and patient medications and                            allergies were reviewed. The patient's tolerance of                            previous anesthesia was also reviewed. The risks                            and benefits of the procedure and the sedation                            options and risks were discussed with the patient.                            All questions were answered, and informed consent                            was obtained. Prior Anticoagulants: The patient has                            taken no previous anticoagulant or antiplatelet                            agents. ASA Grade Assessment: II - A patient with                            mild systemic disease. After reviewing the risks                            and benefits, the patient was deemed in                            satisfactory condition to undergo the procedure.  After obtaining informed consent, the colonoscope                            was passed under direct vision. Throughout the                            procedure, the patient's blood pressure, pulse, and                            oxygen saturations were monitored continuously. The                            CF [id number] #[id number] was introduced through the anus                             and advanced to the the cecum, identified by                            appendiceal orifice and ileocecal valve. The                            colonoscopy was performed without difficulty. The                            patient tolerated the procedure well. The quality                            of the bowel preparation was good. The ileocecal                            valve, appendiceal orifice, and rectum were                            photographed. The bowel preparation used was                            Miralax via split dose instruction. Scope In: 11:03:13 AM Scope Out: 11:17:27 AM Scope Withdrawal Time: 0 hours 12 minutes 11 seconds  Total Procedure Duration: 0 hours 14 minutes 14 seconds  Findings:                 The perianal and digital rectal examinations were                            normal.                           The entire examined colon appeared normal on direct                            and retroflexion views. Complications:            No immediate complications. Estimated Blood Loss:     Estimated blood loss was minimal. Impression:               - The entire  examined colon is normal on direct and                            retroflexion views.                           - No specimens collected. Recommendation:           - Patient has a contact number available for                            emergencies. The signs and symptoms of potential                            delayed complications were discussed with the                            patient. Return to normal activities tomorrow.                            Written discharge instructions were provided to the                            patient.                           - Resume previous diet.                           - Continue present medications.                           - Repeat colonoscopy in 10 years for screening                            purposes. Iva Boop, MD 09/10/2021 11:28:39 AM This  report has been signed electronically.

## 2021-09-10 NOTE — Progress Notes (Signed)
[facility] History and Physical   Primary Care Physician:  [name], NP   Reason for Procedure:   Colon cancer screening  Plan:    colonoscopy     HPI: Darren Scott is a 50 y.o. male here for screening colonoscopy   Past Medical History:  Diagnosis Date   Acute bronchitis 08/16/2014   Arthritis    LEFT knee   Atypical chest pain 02/20/2018   Dyslipidemia 02/20/2018   Frequent headaches    uses PRN meds   GERD (gastroesophageal reflux disease)    on meds   History of hypertension 12/29/2013   Hyperglycemia 12/29/2013   Hyperlipidemia    Hypertension    on meds   Other and unspecified hyperlipidemia 12/29/2013   on meds   Routine general medical examination at a health care facility 12/29/2013   Seasonal allergies     Past Surgical History:  Procedure Laterality Date   KNEE SURGERY Left 2007   WISDOM TOOTH EXTRACTION      Prior to Admission medications   Medication Sig Start Date End Date Taking? Authorizing Provider  amLODipine (NORVASC) 10 MG tablet TAKE 1 TABLET BY MOUTH EVERY DAY 07/31/21  Yes [name], NP  aspirin EC 81 MG tablet Take 1 tablet (81 mg total) by mouth daily. 11/24/19  Yes Tobb, Kardie, DO  esomeprazole (NEXIUM) 20 MG capsule Take 20 mg by mouth daily at 12 noon.   Yes [provider]  metoprolol succinate (TOPROL-XL) 50 MG 24 hr tablet Take 1 tablet (50 mg total) by mouth daily. TAKE WITH OR IMMEDIATELY FOLLOWING A MEAL. 07/03/21  Yes [name], NP  nortriptyline (PAMELOR) 10 MG capsule Take 1 capsule (10 mg total) by mouth at bedtime. Patient taking differently: Take 10 mg by mouth daily as needed. 08/01/21  Yes Jaffe, Adam R, DO  rosuvastatin (CRESTOR) 20 MG tablet TAKE 1 TABLET BY MOUTH EVERY DAY 03/08/21  Yes Tobb, Kardie, DO    Current Outpatient Medications  Medication Sig Dispense Refill   amLODipine (NORVASC) 10 MG tablet TAKE 1 TABLET BY MOUTH EVERY DAY 90 tablet 1   aspirin EC  81 MG tablet Take 1 tablet (81 mg total) by mouth daily. 90 tablet 3   esomeprazole (NEXIUM) 20 MG capsule Take 20 mg by mouth daily at 12 noon.     metoprolol succinate (TOPROL-XL) 50 MG 24 hr tablet Take 1 tablet (50 mg total) by mouth daily. TAKE WITH OR IMMEDIATELY FOLLOWING A MEAL. 90 tablet 1   nortriptyline (PAMELOR) 10 MG capsule Take 1 capsule (10 mg total) by mouth at bedtime. (Patient taking differently: Take 10 mg by mouth daily as needed.) 30 capsule 5   rosuvastatin (CRESTOR) 20 MG tablet TAKE 1 TABLET BY MOUTH EVERY DAY 90 tablet 3   Current Facility-Administered Medications  Medication Dose Route Frequency Provider Last Rate Last Admin   0.9 %  sodium chloride infusion  500 mL Intravenous Continuous Iva Boop, MD        Allergies as of 09/10/2021   (No Known Allergies)    Family History  Problem Relation Age of Onset   Heart attack Father        died in his 107's, smoker   Cancer Neg Hx    Diabetes Neg Hx    Colon cancer Neg Hx    Colon polyps Neg Hx    Esophageal cancer Neg Hx    Rectal cancer Neg Hx    Stomach cancer Neg Hx  Social History   Socioeconomic History   Marital status: Single    Spouse name: Not on file   Number of children: Not on file   Years of education: Not on file   Highest education level: Not on file  Occupational History   Not on file  Tobacco Use   Smoking status: Never   Smokeless tobacco: Current    Types: Chew  Vaping Use   Vaping Use: Never used  Substance and Sexual Activity   Alcohol use: Yes    Alcohol/week: 12.0 - 18.0 standard drinks    Types: 12 - 18 Standard drinks or equivalent per week    Comment: occ   Drug use: No   Sexual activity: Not on file  Other Topics Concern   Not on file  Social History Narrative   3 children 69 yr old son, 32 yr old daughter, son age 6Separated, has joint custody.  Oldest son [place] Financial risk analyst- replaces winshieldsCompleted 8th gradeEnjoys-  hunting/fishing golfing      Left handed      Review of Systems:  All other review of systems negative except as mentioned in the HPI.  Physical Exam: Vital signs BP (!) 155/106    Pulse 80    Temp 98.6 F (37 C) (Temporal)    Ht 5\' 8"  (1.727 m)    Wt 176 lb (79.8 kg)    SpO2 98%    BMI 26.76 kg/m   General:   Alert,  Well-developed, well-nourished, pleasant and cooperative in NAD Lungs:  Clear throughout to auscultation.   Heart:  Regular rate and rhythm; no murmurs, clicks, rubs,  or gallops. Abdomen:  Soft, nontender and nondistended. Normal bowel sounds.   Neuro/Psych:  Alert and cooperative. Normal mood and affect. A and O x 3   [name]  , MD, [facility] [phone] (pager) 09/10/2021 10:54 AM@

## 2021-09-10 NOTE — Progress Notes (Signed)
Pt's states no medical or surgical changes since previsit or office visit. 

## 2021-09-10 NOTE — Progress Notes (Signed)
To PACU, VSS. Report to RN.tb 

## 2021-09-12 ENCOUNTER — Telehealth: Payer: Self-pay

## 2021-09-12 ENCOUNTER — Telehealth: Payer: Self-pay | Admitting: *Deleted

## 2021-09-12 NOTE — Telephone Encounter (Signed)
°  Follow up Call-  Call back number 09/10/2021  Post procedure Call Back phone  # [phone]  Permission to leave phone message Yes  Some recent data might be hidden     Patient questions:  Do you have a fever, pain , or abdominal swelling? No. Pain Score  0 *  Have you tolerated food without any problems? Yes.    Have you been able to return to your normal activities? Yes.    Do you have any questions about your discharge instructions: Diet   No. Medications  No. Follow up visit  No.  Do you have questions or concerns about your Care? No.  Actions: * If pain score is 4 or above: No action needed, pain <4.  Have you developed a fever since your procedure? no  2.   Have you had an respiratory symptoms (SOB or cough) since your procedure? no  3.   Have you tested positive for COVID 19 since your procedure no  4.   Have you had any family members/close contacts diagnosed with the COVID 19 since your procedure?  no   If yes to any of these questions please route to Laverna Peace, RN and Karlton Lemon, RN

## 2021-09-12 NOTE — Telephone Encounter (Signed)
No answer, left message to call back later today, B.Chamika Cunanan RN. 

## 2021-09-13 NOTE — Progress Notes (Signed)
Office Visit Note  Patient: Darren Scott             Date of Birth: [birth year]           MRN: [id number]             PCP: [name], NP Referring: Huel Cote, MD Visit Date: 09/14/2021  Subjective:  New Patient (Initial Visit) (Bil knee swelling, pain)   History of Present Illness: Darren Scott is a 50 y.o. male here for bilateral knee pain suspected inflammatory problem. He has left worse than right knee pain and swelling. This started about 2-3 months ago abruptly he does not recall any preceding injury or medical event. Mostly bothering medial side of the knee. Pain is worst when provoked by certain movement or positions especially kneeling down or when sitting in a fixed position or driving. He did not notice any benefit with oral NSAIDs. This was treated with local steroid injection in November which helped for about 3 weeks total. Xray of the knee was mostly unremarkable. He had left knee arthroscopic debridement in 2007 with not ongoing problems after that time.  Imaging reviewed 07/30/21 Xray right knee IMPRESSION: Mild medial compartment degenerative change.  No acute abnormality.  07/30/21 Xray left knee IMPRESSION: 1. Corticated bony density noted along the superolateral aspect of patella consistent by point Tate patella or old patellar fracture. No acute bony abnormality identified. 2. Mild medial compartment and patellofemoral degenerative change. 3. Tiny knee joint effusion cannot be excluded.  Activities of Daily Living:  Patient reports morning stiffness for 24 hours.   Patient Reports nocturnal pain.  Difficulty dressing/grooming: Denies Difficulty climbing stairs: Reports Difficulty getting out of chair: Reports Difficulty using hands for taps, buttons, cutlery, and/or writing: Denies  Review of Systems  Constitutional:  Negative for fatigue.  HENT:  Negative for mouth dryness.   Eyes:  Negative for dryness.  Respiratory:  Negative for  shortness of breath.   Cardiovascular:  Negative for swelling in legs/feet.  Gastrointestinal:  Negative for constipation.  Endocrine: Negative for heat intolerance.  Genitourinary:  Negative for difficulty urinating.  Musculoskeletal:  Positive for joint pain, gait problem, joint pain, joint swelling and morning stiffness.  Skin:  Negative for rash.  Allergic/Immunologic: Negative for susceptible to infections.  Neurological:  Positive for numbness.  Hematological:  Negative for bruising/bleeding tendency.  Psychiatric/Behavioral:  Positive for sleep disturbance.    PMFS History:  Patient Active Problem List   Diagnosis Date Noted   Pain in left knee 09/14/2021   Chronic nonintractable headache 06/29/2021   Atypical chest pain 02/20/2018   Dyslipidemia 02/20/2018   Routine general medical examination at a health care facility 12/29/2013   History of hypertension 12/29/2013   Other and unspecified hyperlipidemia 12/29/2013   Hyperglycemia 12/29/2013    Past Medical History:  Diagnosis Date   Acute bronchitis 08/16/2014   Arthritis    LEFT knee   Atypical chest pain 02/20/2018   Dyslipidemia 02/20/2018   Frequent headaches    uses PRN meds   GERD (gastroesophageal reflux disease)    on meds   History of hypertension 12/29/2013   Hyperglycemia 12/29/2013   Hyperlipidemia    Hypertension    on meds   Other and unspecified hyperlipidemia 12/29/2013   on meds   Routine general medical examination at a health care facility 12/29/2013   Seasonal allergies     Family History  Problem Relation Age of Onset   Heart attack Father  died in his 58's, smoker   Cancer Neg Hx    Diabetes Neg Hx    Colon cancer Neg Hx    Colon polyps Neg Hx    Esophageal cancer Neg Hx    Rectal cancer Neg Hx    Stomach cancer Neg Hx    Past Surgical History:  Procedure Laterality Date   KNEE SURGERY Left 2007   WISDOM TOOTH EXTRACTION     Social History   Social History Narrative    3 children 57 yr old son, 45 yr old daughter, son age 6Separated, has joint custody.  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  Pulmonary:     Effort: Pulmonary effort is normal.     Breath sounds: Normal breath sounds.  Musculoskeletal:     Right lower leg: No edema.     Left lower leg: No edema.  Skin:    General: Skin is warm and dry.     Findings: No rash.  Neurological:     Mental Status: He is alert.  Psychiatric:        Mood and Affect: Mood normal.     Musculoskeletal Exam:  Neck full ROM no tenderness Shoulders full ROM no tenderness or swelling Elbows full ROM no tenderness or swelling Wrists full ROM no tenderness or swelling Fingers full ROM no tenderness or swelling Knees full ROM, tenderness on medial side of left knee without significant swelling, pain provoked with pulling tibia forward and external rotation  Limited ultrasound inspection showing no significant effusion no color doppler enhancement, no obvious meniscal abnormality   Investigation: No additional findings.  Imaging: No results found.  Recent Labs: Lab Results  Component Value Date   WBC 7.3 08/10/2019   HGB 14.6 08/10/2019   PLT 230.0 08/10/2019   NA 141 06/29/2021   K 4.7 06/29/2021   CL 104 06/29/2021   CO2 31 06/29/2021   GLUCOSE 100 (H) 06/29/2021   BUN 16 06/29/2021   CREATININE 1.20 06/29/2021   BILITOT 0.5 06/29/2021   ALKPHOS 69 08/10/2019   AST 26 06/29/2021   ALT 42 06/29/2021   PROT  7.2 06/29/2021   ALBUMIN 4.6 08/10/2019   CALCIUM 10.1 06/29/2021   GFRAA 91 10/07/2019    Speciality Comments: No specialty comments available.  Procedures:  No procedures performed Allergies: Patient has no known allergies.   Assessment / Plan:     Visit Diagnoses: Left knee pain, unspecified chronicity  No inflammatory changes appreciated and symptoms are very focal I do not suspect inflammatory arthritis cause at this time. There is no joint effusion currently. So far inadequate improvements to NSAIDs or local steroid injection. I suspect more injury or structure problem. Discussed initial treatments and plan to refer to physical therapy as next step. If not improving significantly over time with NSAIDs and therapy will follow up as needed.  Orders: No orders of the defined  types were placed in this encounter.  No orders of the defined types were placed in this encounter.    Follow-Up Instructions: Return in about 10 weeks (around 11/23/2021) for New pt left knee pain f/u 2-10mos.   Fuller Plan, MD  Note - This record has been created using [employer].  Chart creation errors have been sought, but may not always  have been located. Such creation errors do not reflect on  the standard of medical care.

## 2021-09-14 ENCOUNTER — Encounter: Payer: Self-pay | Admitting: Internal Medicine

## 2021-09-14 ENCOUNTER — Other Ambulatory Visit: Payer: Self-pay

## 2021-09-14 ENCOUNTER — Ambulatory Visit (INDEPENDENT_AMBULATORY_CARE_PROVIDER_SITE_OTHER): Payer: BC Managed Care – PPO | Admitting: Internal Medicine

## 2021-09-14 VITALS — BP 138/81 | HR 76 | Resp 15 | Ht 67.0 in | Wt 173.0 lb

## 2021-09-14 DIAGNOSIS — M25562 Pain in left knee: Secondary | ICD-10-CM | POA: Diagnosis not present

## 2021-09-14 DIAGNOSIS — M179 Osteoarthritis of knee, unspecified: Secondary | ICD-10-CM | POA: Insufficient documentation

## 2021-09-14 DIAGNOSIS — S83249A Other tear of medial meniscus, current injury, unspecified knee, initial encounter: Secondary | ICD-10-CM | POA: Insufficient documentation

## 2021-09-17 NOTE — Addendum Note (Signed)
Addended by: Fuller Plan on: 09/17/2021 08:39 AM   Modules accepted: Orders

## 2021-10-04 ENCOUNTER — Ambulatory Visit: Payer: BC Managed Care – PPO | Attending: Internal Medicine | Admitting: Physical Therapy

## 2021-10-04 ENCOUNTER — Other Ambulatory Visit: Payer: Self-pay

## 2021-10-04 DIAGNOSIS — M25661 Stiffness of right knee, not elsewhere classified: Secondary | ICD-10-CM | POA: Insufficient documentation

## 2021-10-04 DIAGNOSIS — M25562 Pain in left knee: Secondary | ICD-10-CM | POA: Insufficient documentation

## 2021-10-04 DIAGNOSIS — M25662 Stiffness of left knee, not elsewhere classified: Secondary | ICD-10-CM | POA: Insufficient documentation

## 2021-10-04 DIAGNOSIS — R29898 Other symptoms and signs involving the musculoskeletal system: Secondary | ICD-10-CM | POA: Diagnosis present

## 2021-10-04 DIAGNOSIS — M25561 Pain in right knee: Secondary | ICD-10-CM | POA: Diagnosis present

## 2021-10-04 NOTE — Therapy (Signed)
OUTPATIENT PHYSICAL THERAPY LOWER EXTREMITY EVALUATION   Patient Name: Darren Scott MRN: [id number] DOB:Apr 21, 1971, 51 y.o., male Today's Date: 10/04/2021   PT End of Session - 10/04/21 0929     Visit Number 1    Number of Visits 6    Date for PT Re-Evaluation 11/15/21    Authorization Type [employer]    PT Start Time [phone]    PT Stop Time 0930    PT Time Calculation (min) 45 min    Activity Tolerance Patient tolerated treatment well    Behavior During Therapy [facility] for tasks assessed/performed             Past Medical History:  Diagnosis Date   Acute bronchitis 08/16/2014   Arthritis    LEFT knee   Atypical chest pain 02/20/2018   Dyslipidemia 02/20/2018   Frequent headaches    uses PRN meds   GERD (gastroesophageal reflux disease)    on meds   History of hypertension 12/29/2013   Hyperglycemia 12/29/2013   Hyperlipidemia    Hypertension    on meds   Other and unspecified hyperlipidemia 12/29/2013   on meds   Routine general medical examination at a health care facility 12/29/2013   Seasonal allergies    Past Surgical History:  Procedure Laterality Date   KNEE SURGERY Left 2007   WISDOM TOOTH EXTRACTION     Patient Active Problem List   Diagnosis Date Noted   Pain in left knee 09/14/2021   Chronic nonintractable headache 06/29/2021   Atypical chest pain 02/20/2018   Dyslipidemia 02/20/2018   Routine general medical examination at a health care facility 12/29/2013   History of hypertension 12/29/2013   Other and unspecified hyperlipidemia 12/29/2013   Hyperglycemia 12/29/2013    PCP: [name], NP  REFERRING PROVIDER: Fuller Plan, MD  REFERRING DIAG: [phone] (ICD-10-CM) - Left knee pain, unspecified chronicity   THERAPY DIAG:  Knee stiffness, Bilateral knee pain  ONSET DATE: 3 mos ago   SUBJECTIVE:   SUBJECTIVE STATEMENT: Pt notes sudden onset of bilateral knee pain and swelling about 3 mos ago.   The Rt knee has improved  but the L knee continues to bother him with prolonged positioning.  With kneeling he has increased pain the next day. He gets relief if he puts something up under his knees or lying down.  In November he had an injection without lasting relief. He does not recall taking series of NSAIDs but MD note said he did.  He is not interested in taking OTC meds for this. He also had fluid taken out of L knee in December.  He can't sit for > 5 min or stand for too long >15 min . He has recently stopped working as much, in the past has been very active and is now quite sedentary.    PERTINENT HISTORY: None  PAIN:  Are you having pain? Yes NPRS scale: 4/10 Pain location: L medial knee ,  can vary anterior , infrapatellar.  "It moves" Pain orientation: Left  PAIN TYPE: chronic  Pain description:  bone to bone, if it pops it feels good    Aggravating factors: kneeling/pressure on it , sitting with knees bent, steps, walking Relieving factors: pillows under knee or laying down.   PRECAUTIONS: None  WEIGHT BEARING RESTRICTIONS No  FALLS:  Has patient fallen in last 6 months? No, Number of falls: 0  LIVING ENVIRONMENT: Lives with: lives alone Lives in: House/apartment Stairs: No; External: 3 steps; on right  going up, has to side step  Has following equipment at home: None  OCCUPATION: Self employed, Armed forces operational officer.  Likes to fish, boating   PLOF: Independent  PATIENT GOALS : To get me to be able to stand, boating and fishing    OBJECTIVE:   DIAGNOSTIC FINDINGS: Mild medial compartment degenerative change   PATIENT SURVEYS:  FOTO NT due to time   COGNITION:  Overall cognitive status: Within functional limits for tasks assessed     SENSATION:  Light touch: Appears intact  Stereognosis: Appears intact  Hot/Cold: Appears intact  Proprioception: Appears intact  MUSCLE LENGTH: Hamstrings: tight  Thomas test: tight in prone, ant hip and quads   POSTURE:  Wide BOS, hips abducted    PALPATION: Pain medial L knee joint line, none peripatellar or post knee   LE MMT:  A/PROM Right 10/04/2021 Left 10/04/2021  Hip flexion 5/5 5/5  Hip extension 5/5 5/5  Hip abduction 5/5 5/5  Hip adduction    Hip internal rotation Tight  Tight   Hip external rotation [facility]   Knee flexion 5/5 5/5 pain   Knee extension 5/5 5/5  Ankle dorsiflexion    Ankle plantarflexion    Ankle inversion    Ankle eversion     (Blank rows = not tested)  LE ROM  MMT Right 10/04/2021 Left 10/04/2021  Hip flexion    Hip extension    Hip abduction    Hip adduction    Hip internal rotation    Hip external rotation    Knee flexion 132 110  Knee extension 0 0  Ankle dorsiflexion    Ankle plantarflexion    Ankle inversion    Ankle eversion     (Blank rows = not tested)  LOWER EXTREMITY SPECIAL TESTS:  Knee special tests: neg instability   FUNCTIONAL TESTS:  5 times sit to stand: 15 sec  Step ups with min anterior knee pain unable to step down without severe pain  SLS L more effort than Rt but can do this, < 20 sec.  GAIT: Distance walked: 150 Assistive device utilized: None Level of assistance: Complete Independence Comments: antalgic gait with wide stance    TODAY'S TREATMENT: PT assessment, eval findings, HEP, stretching    PATIENT EDUCATION:  Education details: PT/POC , HEP Person educated: Patient Education method: Consulting civil engineer, Demonstration, Verbal cues, and Handouts Education comprehension: verbalized understanding and needs further education   HOME EXERCISE PROGRAM: Access Code: [email or web address] URL: [email or web address] Date: 10/04/2021 Prepared by: Raeford Razor  Exercises Single Leg Stance - 1-2 x daily - 7 x weekly - 1 sets - 3 reps - 30 hold Gastroc Stretch on Wall - 1-2 x daily - 7 x weekly - 1 sets - 3 reps - 30 hold Standing Hip Flexor Stretch - 1-2 x daily - 7 x weekly - 1 sets - 3 reps - 30 hold Quadriceps Stretch with Chair - 1-2 x daily -  7 x weekly - 1 sets - 3 reps - 30 hold Seated Hamstring Stretch - 1-2 x daily - 7 x weekly - 1 sets - 3 reps - 30 hold Supine Hamstring Stretch with Strap - 1-2 x daily - 7 x weekly - 1 sets - 3 reps - 30 hold   ASSESSMENT:  CLINICAL IMPRESSION: Patient is a 51  y.o. male  who was seen today for physical therapy evaluation and treatment for bilateral knee pain , Lt.>Rt which has become chronic. Objective impairments include Abnormal gait, decreased balance, decreased  mobility, difficulty walking, decreased ROM, decreased strength, increased fascial restrictions, impaired flexibility, and pain. These impairments are limiting patient from community activity, occupation, and yard work. Personal factors including Behavior pattern and Past/current experiences are also affecting patient's functional outcome. Patient will benefit from skilled PT to address above impairments and improve overall function.  REHAB POTENTIAL: Excellent  CLINICAL DECISION MAKING: Stable/uncomplicated  EVALUATION COMPLEXITY: Low   GOALS:    LONG TERM GOALS:   LTG Name Target Date Goal status  1 Pt will be able to show independence and consistency with HEP for LE, knees  Baseline: 11/15/2021 INITIAL  2 Pt will perform sit to stand with no UEs x 5, < 10 sec  Baseline: 11/15/2021 INITIAL  3 Pt will be able to stand for 20-30 min with no more than min increase in pain for home tasks. Baseline: 11/15/2021 INITIAL  4 Pt will be able to descend stairs with 1 rail reciprocally with normal pattern, pain minimal  Baseline: 11/15/2021 INITIAL                  PLAN: PT FREQUENCY: 1x/week  PT DURATION: 6 weeks  PLANNED INTERVENTIONS: Therapeutic exercises, Therapeutic activity, Patient/Family education, Joint mobilization, Dry Needling, Cryotherapy, Moist heat, and Manual therapy  PLAN FOR NEXT SESSION: check HEP, LE mobility/flexibility.  Sit to Stand , Nustep/recumbent, consider DN to hamstring, quads     Dulcie Gammon 10/04/2021, 12:22 PM  Raeford Razor, PT 10/04/21 1:00 PM Phone: [phone] Fax: [phone]

## 2021-10-08 ENCOUNTER — Ambulatory Visit: Payer: BC Managed Care – PPO | Admitting: Neurology

## 2021-10-08 NOTE — Therapy (Signed)
OUTPATIENT PHYSICAL THERAPY TREATMENT NOTE   Patient Name: Darren Scott MRN: [id number] DOB:05/13/1971, 51 y.o., male Today's Date: 10/09/2021  PCP: [name], NP REFERRING PROVIDER: [name], NP   PT End of Session - 10/09/21 0946     Visit Number 2    Number of Visits 6    Date for PT Re-Evaluation 11/15/21    Authorization Type [employer]    PT Start Time [phone]    PT Stop Time 1042    PT Time Calculation (min) 54 min    Activity Tolerance Patient tolerated treatment well    Behavior During Therapy [facility] for tasks assessed/performed             Past Medical History:  Diagnosis Date   Acute bronchitis 08/16/2014   Arthritis    LEFT knee   Atypical chest pain 02/20/2018   Dyslipidemia 02/20/2018   Frequent headaches    uses PRN meds   GERD (gastroesophageal reflux disease)    on meds   History of hypertension 12/29/2013   Hyperglycemia 12/29/2013   Hyperlipidemia    Hypertension    on meds   Other and unspecified hyperlipidemia 12/29/2013   on meds   Routine general medical examination at a health care facility 12/29/2013   Seasonal allergies    Past Surgical History:  Procedure Laterality Date   KNEE SURGERY Left 2007   WISDOM TOOTH EXTRACTION     Patient Active Problem List   Diagnosis Date Noted   Pain in left knee 09/14/2021   Chronic nonintractable headache 06/29/2021   Atypical chest pain 02/20/2018   Dyslipidemia 02/20/2018   Routine general medical examination at a health care facility 12/29/2013   History of hypertension 12/29/2013   Other and unspecified hyperlipidemia 12/29/2013   Hyperglycemia 12/29/2013    REFERRING DIAG: M25.562 (ICD-10-CM) - Left knee pain, unspecified chronicity   THERAPY DIAG:  Acute pain of right knee  Acute pain of left knee  PRECAUTIONS: None  SUBJECTIVE: Pt presents to PT with reports of continued bilateral knee pain, L>R. Has been compliant with initial HEP with no adverse effect.  He notes that he has begun walking on treadmill again, does increase pain slightly. Pt is ready to begin PT treatment at this time.  Pain: Are you having pain? Yes NPRS: 2/10 Pain Location: bilateral knees, L>R Pain Frequency/Description: intermittent  Aggravating Factors: kneeling/pressure on it , sitting with knees bent, steps, walking Relieving Factors: pillows under knee or laying down.     OBJECTIVE:    TODAY'S TREATMENT: [facility] Adult PT Treatment/Exercise: Therapeutic Exercise:  Recumbent bike lvl 2 x 3 min while taking subjective SLS on foam 2x30" ea Standing calf stretch 2x30" ea Slant board stretch 3x45" Lateral walk x 4 laps in // Standing hip abd/ext 2x10 RTB ea Step up 6in fwd 2x10 ea Eccentric heel tap 2in x 10 ea Prone quad stretch with strap 3x30" ea Supine SLR 2x15 Supine hamstring stretch w/ strap 2x30" ea STS no UE support 2x10 ea   HOME EXERCISE PROGRAM: Access Code: [email or web address] URL: [email or web address] Date: 10/04/2021 Prepared by: Karie Mainland   Exercises Single Leg Stance - 1-2 x daily - 7 x weekly - 1 sets - 3 reps - 30 hold Gastroc Stretch on Wall - 1-2 x daily - 7 x weekly - 1 sets - 3 reps - 30 hold Standing Hip Flexor Stretch - 1-2 x daily - 7 x weekly - 1 sets - 3 reps - 30 hold Quadriceps Stretch with  Chair - 1-2 x daily - 7 x weekly - 1 sets - 3 reps - 30 hold Seated Hamstring Stretch - 1-2 x daily - 7 x weekly - 1 sets - 3 reps - 30 hold Supine Hamstring Stretch with Strap - 1-2 x daily - 7 x weekly - 1 sets - 3 reps - 30 hold     ASSESSMENT:   CLINICAL IMPRESSION: Pt was able to complete all prescribed exercises with no adverse effect or increase in pain. Therapy today focused on improving LE strength with emphasis on proximal hip and eccentric quad control. He is progressing as expected with therapy thus far and continues to benefit from skilled PT services. Will continue to progress as tolerated per POC.      GOALS:    LONG TERM GOALS:    LTG Name Target Date Goal status  1 Pt will be able to show independence and consistency with HEP for LE, knees  Baseline: 11/15/2021 INITIAL  2 Pt will perform sit to stand with no UEs x 5, < 10 sec  Baseline: 15 sec 11/15/2021 INITIAL  3 Pt will be able to stand for 20-30 min with no more than min increase in pain for home tasks. Baseline: unable 11/15/2021 INITIAL  4 Pt will be able to descend stairs with 1 rail reciprocally with normal pattern, pain minimal  Baseline: unable 11/15/2021 INITIAL    PLAN: PT FREQUENCY: 1x/week   PT DURATION: 6 weeks   PLANNED INTERVENTIONS: Therapeutic exercises, Therapeutic activity, Patient/Family education, Joint mobilization, Dry Needling, Cryotherapy, Moist heat, and Manual therapy   PLAN FOR NEXT SESSION: check HEP, LE mobility/flexibility, consider DN to hamstring, quads; continue eccentric quad strengthening     Eloy End 10/09/2021, 11:27 AM

## 2021-10-09 ENCOUNTER — Other Ambulatory Visit: Payer: Self-pay

## 2021-10-09 ENCOUNTER — Ambulatory Visit: Payer: BC Managed Care – PPO

## 2021-10-09 DIAGNOSIS — M25562 Pain in left knee: Secondary | ICD-10-CM

## 2021-10-09 DIAGNOSIS — M25561 Pain in right knee: Secondary | ICD-10-CM

## 2021-10-18 ENCOUNTER — Ambulatory Visit: Payer: BC Managed Care – PPO | Admitting: Physical Therapy

## 2021-10-18 ENCOUNTER — Encounter: Payer: Self-pay | Admitting: Physical Therapy

## 2021-10-18 ENCOUNTER — Other Ambulatory Visit: Payer: Self-pay

## 2021-10-18 DIAGNOSIS — M25561 Pain in right knee: Secondary | ICD-10-CM | POA: Diagnosis not present

## 2021-10-18 DIAGNOSIS — M25662 Stiffness of left knee, not elsewhere classified: Secondary | ICD-10-CM

## 2021-10-18 DIAGNOSIS — R29898 Other symptoms and signs involving the musculoskeletal system: Secondary | ICD-10-CM

## 2021-10-18 DIAGNOSIS — M25562 Pain in left knee: Secondary | ICD-10-CM

## 2021-10-18 DIAGNOSIS — M25661 Stiffness of right knee, not elsewhere classified: Secondary | ICD-10-CM

## 2021-10-18 NOTE — Therapy (Signed)
OUTPATIENT PHYSICAL THERAPY TREATMENT NOTE   Patient Name: Darren Scott MRN: [id number] DOB:14-Oct-1970, 51 y.o., male Today's Date: 10/18/2021  PCP: [name], NP REFERRING PROVIDER: Fuller Plan, MD   PT End of Session - 10/18/21 0902     Visit Number 3    Number of Visits 6    Date for PT Re-Evaluation 11/15/21    Authorization Type [employer]    PT Start Time 0900    PT Stop Time 0948    PT Time Calculation (min) 48 min    Activity Tolerance Patient tolerated treatment well    Behavior During Therapy [facility] for tasks assessed/performed              Past Medical History:  Diagnosis Date   Acute bronchitis 08/16/2014   Arthritis    LEFT knee   Atypical chest pain 02/20/2018   Dyslipidemia 02/20/2018   Frequent headaches    uses PRN meds   GERD (gastroesophageal reflux disease)    on meds   History of hypertension 12/29/2013   Hyperglycemia 12/29/2013   Hyperlipidemia    Hypertension    on meds   Other and unspecified hyperlipidemia 12/29/2013   on meds   Routine general medical examination at a health care facility 12/29/2013   Seasonal allergies    Past Surgical History:  Procedure Laterality Date   KNEE SURGERY Left 2007   WISDOM TOOTH EXTRACTION     Patient Active Problem List   Diagnosis Date Noted   Pain in left knee 09/14/2021   Chronic nonintractable headache 06/29/2021   Atypical chest pain 02/20/2018   Dyslipidemia 02/20/2018   Routine general medical examination at a health care facility 12/29/2013   History of hypertension 12/29/2013   Other and unspecified hyperlipidemia 12/29/2013   Hyperglycemia 12/29/2013    REFERRING DIAG: M25.562 (ICD-10-CM) - Left knee pain, unspecified chronicity   THERAPY DIAG:  Acute pain of right knee  Acute pain of left knee  Stiffness of left knee, not elsewhere classified  Stiffness of right knee, not elsewhere classified  Other symptoms and signs involving the musculoskeletal  system  PRECAUTIONS: None  SUBJECTIVE:  Yesterday was 8/10 was very busy.  Was down on my knees and a small ladder.  I am very sore after PT sessions.  Today, 2/10 both knees.  I still walk everyday 30 min , 3.5 % on the treadmill.  I don't always stretch though.   Pain: Are you having pain? Yes NPRS: 2/10 Pain Location: bilateral knees, L>R Pain Frequency/Description: intermittent  Aggravating Factors: kneeling/pressure on it , sitting with knees bent, steps, walking Relieving Factors: pillows under knee or laying down.     OBJECTIVE:   10/18/21: TODAY'S TREATMENT:  [facility] Adult PT Treatment/Exercise:  [facility] Adult PT Treatment:                                                DATE: 10/18/21 Therapeutic Exercise: Recumbent bike 6 min L2  Stretching to calf 1 x 60 sec Adductor 20 sec x 3 each side standing lunge Hamstring/ITB seated and supine with and without straps  Prone quads with strap  Contract relax for hamstring and quad with PT manual assist Prone hip ext 2 x 10 each side (bent knee x 10 and knee ext  x 10) Thomas test position for hip flexor stretch (end  of mat) Standing hip flexor stretch x 3  x 30 sec each side    Manual Therapy: PROM prone quads and hips  IASTM Lt ITB and Lt TFL  Self Care: Moderation with housework, better to stretch after walking, contract-relax  Hip flexor and ITB tightness, thomas test /stretch    TODAY'S TREATMENT: [facility] Adult PT Treatment/Exercise: Therapeutic Exercise:  Recumbent bike lvl 2 x 3 min while taking subjective SLS on foam 2x30" ea Standing calf stretch 2x30" ea Slant board stretch 3x45" Lateral walk x 4 laps in // Standing hip abd/ext 2x10 RTB ea Step up 6in fwd 2x10 ea Eccentric heel tap 2in x 10 ea Prone quad stretch with strap 3x30" ea Supine SLR 2x15 Supine hamstring stretch w/ strap 2x30" ea STS no UE support 2x10 ea   HOME EXERCISE PROGRAM: Access Code: [email or web address] URL: [email or web address] Date:  10/04/2021 Prepared by: Karie Mainland   Exercises Single Leg Stance - 1-2 x daily - 7 x weekly - 1 sets - 3 reps - 30 hold Gastroc Stretch on Wall - 1-2 x daily - 7 x weekly - 1 sets - 3 reps - 30 hold Standing Hip Flexor Stretch - 1-2 x daily - 7 x weekly - 1 sets - 3 reps - 30 hold Quadriceps Stretch with Chair - 1-2 x daily - 7 x weekly - 1 sets - 3 reps - 30 hold Seated Hamstring Stretch - 1-2 x daily - 7 x weekly - 1 sets - 3 reps - 30 hold Supine Hamstring Stretch with Strap - 1-2 x daily - 7 x weekly - 1 sets - 3 reps - 30 hold     ASSESSMENT:   CLINICAL IMPRESSION:  Patient with significant knee and hip stiffness L>R. Pt experienced intermittent popping of L patella medially as knee went from extension to flexion.  Pain resolved but it happened about 3 times in L knee. He has ontinued to walk on the TM at home but only stretches daily.  He takes no med for pain, inflammation.  Pain limits him for sleeping but he is an "all or nothing" and will go from extremes of excess activity and then no activity.  Advised moderation with activity and consistency of stretching. He will be open to dry needling next visit if possible (ITB and L quads).       GOALS:   LONG TERM GOALS:    LTG Name Target Date Goal status  1 Pt will be able to show independence and consistency with HEP for LE, knees  Baseline: 11/15/2021 INITIAL  2 Pt will perform sit to stand with no UEs x 5, < 10 sec  Baseline: 15 sec 11/15/2021 INITIAL  3 Pt will be able to stand for 20-30 min with no more than min increase in pain for home tasks. Baseline: unable 11/15/2021 INITIAL  4 Pt will be able to descend stairs with 1 rail reciprocally with normal pattern, pain minimal  Baseline: unable 11/15/2021 INITIAL    PLAN: PT FREQUENCY: 1x/week   PT DURATION: 6 weeks   PLANNED INTERVENTIONS: Therapeutic exercises, Therapeutic activity, Patient/Family education, Joint mobilization, Dry Needling, Cryotherapy, Moist heat, and  Manual therapy   PLAN FOR NEXT SESSION: check HEP, LE mobility/flexibility, consider DN to TFL, L quads; continue eccentric quad strengthening  Karie Mainland, PT 10/18/21 9:58 AM Phone: [phone] Fax: [phone]

## 2021-10-23 NOTE — Therapy (Signed)
OUTPATIENT PHYSICAL THERAPY TREATMENT NOTE   Patient Name: Darren Scott MRN: [id number] DOB:1971/01/13, 51 y.o., male Today's Date: 10/24/2021  PCP: Debbrah Alar, NP REFERRING PROVIDER: Collier Salina, MD   PT End of Session - 10/24/21 [phone]     Visit Number 4    Number of Visits 6    Date for PT Re-Evaluation 11/15/21    Authorization Type [employer]    PT Start Time [phone]    PT Stop Time 0930    PT Time Calculation (min) 45 min    Activity Tolerance Patient tolerated treatment well    Behavior During Therapy [facility] for tasks assessed/performed               Past Medical History:  Diagnosis Date   Acute bronchitis 08/16/2014   Arthritis    LEFT knee   Atypical chest pain 02/20/2018   Dyslipidemia 02/20/2018   Frequent headaches    uses PRN meds   GERD (gastroesophageal reflux disease)    on meds   History of hypertension 12/29/2013   Hyperglycemia 12/29/2013   Hyperlipidemia    Hypertension    on meds   Other and unspecified hyperlipidemia 12/29/2013   on meds   Routine general medical examination at a health care facility 12/29/2013   Seasonal allergies    Past Surgical History:  Procedure Laterality Date   KNEE SURGERY Left 2007   WISDOM TOOTH EXTRACTION     Patient Active Problem List   Diagnosis Date Noted   Pain in left knee 09/14/2021   Chronic nonintractable headache 06/29/2021   Atypical chest pain 02/20/2018   Dyslipidemia 02/20/2018   Routine general medical examination at a health care facility 12/29/2013   History of hypertension 12/29/2013   Other and unspecified hyperlipidemia 12/29/2013   Hyperglycemia 12/29/2013    REFERRING DIAG: M25.562 (ICD-10-CM) - Left knee pain, unspecified chronicity   THERAPY DIAG:  Acute pain of right knee  Acute pain of left knee  Stiffness of left knee, not elsewhere classified  Stiffness of right knee, not elsewhere classified  Other symptoms and signs involving the musculoskeletal  system  PRECAUTIONS: None  SUBJECTIVE:  I had to get down on my knees the other day to install a tub and they swelled up.  I can go up and down the steps easier instead of sideways.  No pain this Am.  He reports no major pain with grocery store but has to sit after 30 min of walking (mall) .   Pain: Are you having pain? No  NPRS: 2/10 Pain Location: bilateral knees, L>R.  Lt medial condyle.  Pain Frequency/Description: intermittent  Aggravating Factors: kneeling/pressure on it , sitting with knees bent, steps, walking Relieving Factors: pillows under knee or laying down.     OBJECTIVE:    [place] Adult PT Treatment:                                                DATE: 10/24/21 Therapeutic Exercise: Recumbent bike L2 for 7 min  Hamstring stretch 30 sec x 3  Piriformis seated figure 4 . 30 sec x 3  Standing exercises for lower body Squat 15 lbs x 10 , L knee soreness increasing  RDL 15 lbs 2 x 15 Sissy Squat 2 x 15 no wgt.  Leg press 3 plates x 15 double leg , 2  plates single leg, had to go down to 1 plate for L LE  Step downs 8 inch reverse for glutes bilateral UEs  ITB 3 x 30 sec   Self Care: Activity modifications knee pads for work      10/18/21: TODAY'S TREATMENT:  [facility] Adult PT Treatment/Exercise:  [facility] Adult PT Treatment:                                                DATE: 10/18/21 Therapeutic Exercise: Recumbent bike 6 min L2  Stretching to calf 1 x 60 sec Adductor 20 sec x 3 each side standing lunge Hamstring/ITB seated and supine with and without straps  Prone quads with strap  Contract relax for hamstring and quad with PT manual assist Prone hip ext 2 x 10 each side (bent knee x 10 and knee ext  x 10) Thomas test position for hip flexor stretch (end of mat) Standing hip flexor stretch x 3  x 30 sec each side    Manual Therapy: PROM prone quads and hips  IASTM Lt ITB and Lt TFL  Self Care: Moderation with housework, better to stretch after walking,  contract-relax  Hip flexor and ITB tightness, thomas test /stretch    TODAY'S TREATMENT: [facility] Adult PT Treatment/Exercise: Therapeutic Exercise:  Recumbent bike lvl 2 x 3 min while taking subjective SLS on foam 2x30" ea Standing calf stretch 2x30" ea Slant board stretch 3x45" Lateral walk x 4 laps in // Standing hip abd/ext 2x10 RTB ea Step up 6in fwd 2x10 ea Eccentric heel tap 2in x 10 ea Prone quad stretch with strap 3x30" ea Supine SLR 2x15 Supine hamstring stretch w/ strap 2x30" ea STS no UE support 2x10 ea   HOME EXERCISE PROGRAM:    Access Code: [email or web address] URL: [email or web address] Date: 10/24/2021 Prepared by: Raeford Razor  Exercises Single Leg Stance - 1-2 x daily - 7 x weekly - 1 sets - 3 reps - 30 hold Gastroc Stretch on Wall - 1-2 x daily - 7 x weekly - 1 sets - 3 reps - 30 hold Standing Hip Flexor Stretch - 1-2 x daily - 7 x weekly - 1 sets - 3 reps - 30 hold Quadriceps Stretch with Chair - 1-2 x daily - 7 x weekly - 1 sets - 3 reps - 30 hold Seated Hamstring Stretch - 1-2 x daily - 7 x weekly - 1 sets - 3 reps - 30 hold Supine Hamstring Stretch with Strap - 1-2 x daily - 7 x weekly - 1 sets - 3 reps - 30 hold Supine ITB Stretch with Strap - 1-2 x daily - 7 x weekly - 1 sets - 3 reps - 30 hold Thomas Stretch on Table - 1-2 x daily - 7 x weekly - 1 sets - 3 reps - 30 hold Wall Squat with Swiss Ball - 1-2 x daily - 7 x weekly - 2 sets - 10 reps - 5-10 hold Straight Leg Raise with External Rotation - 1-2 x daily - 7 x weekly - 2 sets - 10 reps - 5 hold    ASSESSMENT:   CLINICAL IMPRESSION: Patient with increased L knee discomfort as well as fatigue especially in L knee.  He needed increased time between exercises for rest once supine.   L ITB more painful, hip restricted.  Can perform sit to stand x  5 in 10 sec with narrow and wide stance. He has one more visit but may consider 1 more for dry needling.      GOALS:   LONG TERM GOALS:    LTG Name  Target Date Goal status  1 Pt will be able to show independence and consistency with HEP for LE, knees  Baseline: 11/15/2021 ongoing  2 Pt will perform sit to stand with no UEs x 5, < 10 sec  Baseline: 15 sec 11/15/2021 Ongoing   3 Pt will be able to stand for 20-30 min with no more than min increase in pain for home tasks. Baseline: unable 11/15/2021 met  4 Pt will be able to descend stairs with 1 rail reciprocally with normal pattern, pain minimal  Baseline: unable 11/15/2021 met    PLAN: PT FREQUENCY: 1x/week   PT DURATION: 6 weeks   PLANNED INTERVENTIONS: Therapeutic exercises, Therapeutic activity, Patient/Family education, Joint mobilization, Dry Needling, Cryotherapy, Moist heat, and Manual therapy   PLAN FOR NEXT SESSION: check HEP, LE mobility/flexibility, consider DN to TFL, L quads; continue eccentric quad strengthening  Raeford Razor, PT 10/24/21 9:32 AM Phone: [phone] Fax: [phone]

## 2021-10-24 ENCOUNTER — Ambulatory Visit: Payer: BC Managed Care – PPO | Attending: Internal Medicine | Admitting: Physical Therapy

## 2021-10-24 ENCOUNTER — Encounter: Payer: Self-pay | Admitting: Physical Therapy

## 2021-10-24 ENCOUNTER — Other Ambulatory Visit: Payer: Self-pay

## 2021-10-24 DIAGNOSIS — M25661 Stiffness of right knee, not elsewhere classified: Secondary | ICD-10-CM | POA: Diagnosis present

## 2021-10-24 DIAGNOSIS — M25562 Pain in left knee: Secondary | ICD-10-CM | POA: Insufficient documentation

## 2021-10-24 DIAGNOSIS — R29898 Other symptoms and signs involving the musculoskeletal system: Secondary | ICD-10-CM | POA: Diagnosis present

## 2021-10-24 DIAGNOSIS — M25662 Stiffness of left knee, not elsewhere classified: Secondary | ICD-10-CM | POA: Insufficient documentation

## 2021-10-24 DIAGNOSIS — M25561 Pain in right knee: Secondary | ICD-10-CM | POA: Insufficient documentation

## 2021-10-31 NOTE — Therapy (Signed)
OUTPATIENT PHYSICAL THERAPY TREATMENT NOTE   Patient Name: Darren Scott MRN: [id number] DOB:[birth year], 51 y.o., male Today's Date: 11/01/2021  PCP: Debbrah Alar, NP REFERRING PROVIDER: Collier Salina, MD   PT End of Session - 11/01/21 0850     Visit Number 5    Number of Visits 6    Date for PT Re-Evaluation 11/15/21    Authorization Type [employer]    PT Start Time [phone]    PT Stop Time 0920    PT Time Calculation (min) 32 min    Activity Tolerance Patient tolerated treatment well    Behavior During Therapy [facility] for tasks assessed/performed                Past Medical History:  Diagnosis Date   Acute bronchitis 08/16/2014   Arthritis    LEFT knee   Atypical chest pain 02/20/2018   Dyslipidemia 02/20/2018   Frequent headaches    uses PRN meds   GERD (gastroesophageal reflux disease)    on meds   History of hypertension 12/29/2013   Hyperglycemia 12/29/2013   Hyperlipidemia    Hypertension    on meds   Other and unspecified hyperlipidemia 12/29/2013   on meds   Routine general medical examination at a health care facility 12/29/2013   Seasonal allergies    Past Surgical History:  Procedure Laterality Date   KNEE SURGERY Left 2007   WISDOM TOOTH EXTRACTION     Patient Active Problem List   Diagnosis Date Noted   Pain in left knee 09/14/2021   Chronic nonintractable headache 06/29/2021   Atypical chest pain 02/20/2018   Dyslipidemia 02/20/2018   Routine general medical examination at a health care facility 12/29/2013   History of hypertension 12/29/2013   Other and unspecified hyperlipidemia 12/29/2013   Hyperglycemia 12/29/2013    REFERRING DIAG: M25.562 (ICD-10-CM) - Left knee pain, unspecified chronicity   THERAPY DIAG:  Acute pain of right knee  Acute pain of left knee  Stiffness of left knee, not elsewhere classified  Stiffness of right knee, not elsewhere classified  Other symptoms and signs involving the musculoskeletal  system  PRECAUTIONS: None  SUBJECTIVE:   It is good today.  I can crouch and have a little pain . If I kneel directly on it they really hurt.  I have to do this for work. Pt is ready for discharge today.  He has not had a cortisone injection.  Pain: Are you having pain? Yes  NPRS: 1/10 Pain Location: today R side only.  Pain Frequency/Description: intermittent  Aggravating Factors: kneeling/pressure on it , sitting with knees bent, steps, walking Relieving Factors: pillows under knee or laying down.     OBJECTIVE:     [place] Adult PT Treatment:                                                DATE: 10/31/21 Therapeutic Exercise: Recumbent bike L2 for 7 min  Hamstring stretch/ITB with strap 30 sec x 3 each  SLR x 15 toes up Then with hip ER for VMO x 15 each  Bridge x 15  Piriformis x 2 seated figure 4  Single leg balance each LE 30 sec  Wall for calf stretch 30 sec  x 2  Self Care: HEP and final DC info   [place] Adult PT Treatment:  DATE: 10/24/21 Therapeutic Exercise: Recumbent bike L2 for 7 min  Hamstring stretch 30 sec x 3  Piriformis seated figure 4 . 30 sec x 3  Standing exercises for lower body Squat 15 lbs x 10 , L knee soreness increasing  RDL 15 lbs 2 x 15 Sissy Squat 2 x 15 no wgt.  Leg press 3 plates x 15 double leg , 2 plates single leg, had to go down to 1 plate for L LE  Step downs 8 inch reverse for glutes bilateral UEs  ITB 3 x 30 sec   Self Care: Activity modifications knee pads for work      10/18/21: TODAY'S TREATMENT:  [facility] Adult PT Treatment/Exercise:  [facility] Adult PT Treatment:                                                DATE: 10/18/21 Therapeutic Exercise: Recumbent bike 6 min L2  Stretching to calf 1 x 60 sec Adductor 20 sec x 3 each side standing lunge Hamstring/ITB seated and supine with and without straps  Prone quads with strap  Contract relax for hamstring and quad with PT manual  assist Prone hip ext 2 x 10 each side (bent knee x 10 and knee ext  x 10) Thomas test position for hip flexor stretch (end of mat) Standing hip flexor stretch x 3  x 30 sec each side    Manual Therapy: PROM prone quads and hips  IASTM Lt ITB and Lt TFL  Self Care: Moderation with housework, better to stretch after walking, contract-relax  Hip flexor and ITB tightness, thomas test /stretch    TODAY'S TREATMENT: [facility] Adult PT Treatment/Exercise: Therapeutic Exercise:  Recumbent bike lvl 2 x 3 min while taking subjective SLS on foam 2x30" ea Standing calf stretch 2x30" ea Slant board stretch 3x45" Lateral walk x 4 laps in // Standing hip abd/ext 2x10 RTB ea Step up 6in fwd 2x10 ea Eccentric heel tap 2in x 10 ea Prone quad stretch with strap 3x30" ea Supine SLR 2x15 Supine hamstring stretch w/ strap 2x30" ea STS no UE support 2x10 ea   HOME EXERCISE PROGRAM:    Access Code: [email or web address] URL: [email or web address] Date: 10/24/2021 Prepared by: Raeford Razor  Exercises Single Leg Stance - 1-2 x daily - 7 x weekly - 1 sets - 3 reps - 30 hold Gastroc Stretch on Wall - 1-2 x daily - 7 x weekly - 1 sets - 3 reps - 30 hold Standing Hip Flexor Stretch - 1-2 x daily - 7 x weekly - 1 sets - 3 reps - 30 hold Quadriceps Stretch with Chair - 1-2 x daily - 7 x weekly - 1 sets - 3 reps - 30 hold Seated Hamstring Stretch - 1-2 x daily - 7 x weekly - 1 sets - 3 reps - 30 hold Supine Hamstring Stretch with Strap - 1-2 x daily - 7 x weekly - 1 sets - 3 reps - 30 hold Supine ITB Stretch with Strap - 1-2 x daily - 7 x weekly - 1 sets - 3 reps - 30 hold Thomas Stretch on Table - 1-2 x daily - 7 x weekly - 1 sets - 3 reps - 30 hold Wall Squat with Swiss Ball - 1-2 x daily - 7 x weekly - 2 sets - 10 reps - 5-10 hold Straight Leg Raise  with External Rotation - 1-2 x daily - 7 x weekly - 2 sets - 10 reps - 5 hold    GOALS:   LONG TERM GOALS:    LTG Name Target Date Goal status  1  Pt will be able to show independence and consistency with HEP for LE, knees  Baseline: 11/15/2021 met  2 Pt will perform sit to stand with no UEs x 5, < 10 sec  Baseline: 15 sec 11/15/2021 met  3 Pt will be able to stand for 20-30 min with no more than min increase in pain for home tasks. Baseline: unable 11/15/2021 met  4 Pt will be able to descend stairs with 1 rail reciprocally with normal pattern, pain minimal  Baseline: unable 11/15/2021 met    PLAN: PT FREQUENCY: 1x/week   PT DURATION: 6 weeks   PLANNED INTERVENTIONS: Therapeutic exercises, Therapeutic activity, Patient/Family education, Joint mobilization, Dry Needling, Cryotherapy, Moist heat, and Manual therapy   PLAN FOR NEXT SESSION:   Discharge from PT      ASSESSMENT:   CLINICAL IMPRESSION:  Patient has met all goals.  He continues to have stiffness in knees and hips.  He does feel that therapy has helped him walk and stand longer periods.  He will see MD next Month for further evaluation.  Dc from PT at this time.      PHYSICAL THERAPY DISCHARGE SUMMARY  Visits from Start of Care: 5  Current functional level related to goals / functional outcomes: See above    Remaining deficits: Stiffness when sitting too long , unable to kneel any length of time.    Education / Equipment: HEP, DC   Patient agrees to discharge. Patient goals were met. Patient is being discharged due to being pleased with the current functional level.   Raeford Razor, PT 11/01/21 9:26 AM Phone: [phone] Fax: [phone]

## 2021-11-01 ENCOUNTER — Ambulatory Visit: Payer: BC Managed Care – PPO | Admitting: Physical Therapy

## 2021-11-01 ENCOUNTER — Encounter: Payer: Self-pay | Admitting: Physical Therapy

## 2021-11-01 ENCOUNTER — Other Ambulatory Visit: Payer: Self-pay

## 2021-11-01 DIAGNOSIS — M25561 Pain in right knee: Secondary | ICD-10-CM

## 2021-11-01 DIAGNOSIS — R29898 Other symptoms and signs involving the musculoskeletal system: Secondary | ICD-10-CM

## 2021-11-01 DIAGNOSIS — M25661 Stiffness of right knee, not elsewhere classified: Secondary | ICD-10-CM

## 2021-11-01 DIAGNOSIS — M25662 Stiffness of left knee, not elsewhere classified: Secondary | ICD-10-CM

## 2021-11-01 DIAGNOSIS — M25562 Pain in left knee: Secondary | ICD-10-CM

## 2021-11-10 ENCOUNTER — Telehealth: Payer: Self-pay | Admitting: Family

## 2021-11-10 DIAGNOSIS — I2510 Atherosclerotic heart disease of native coronary artery without angina pectoris: Secondary | ICD-10-CM

## 2021-11-10 NOTE — Telephone Encounter (Signed)
Girlfriend requested cardiology referral for pt as he is having trouble getting office to call him back to schedule follow up.

## 2021-12-05 ENCOUNTER — Ambulatory Visit: Payer: BC Managed Care – PPO | Admitting: Cardiology

## 2021-12-13 ENCOUNTER — Encounter: Payer: Self-pay | Admitting: Cardiology

## 2021-12-13 ENCOUNTER — Ambulatory Visit: Payer: BLUE CROSS/BLUE SHIELD | Admitting: Internal Medicine

## 2021-12-28 ENCOUNTER — Other Ambulatory Visit: Payer: Self-pay | Admitting: Family

## 2021-12-31 ENCOUNTER — Encounter: Payer: BC Managed Care – PPO | Admitting: Family

## 2022-01-01 ENCOUNTER — Encounter: Payer: Self-pay | Admitting: Cardiology

## 2022-01-01 ENCOUNTER — Ambulatory Visit (INDEPENDENT_AMBULATORY_CARE_PROVIDER_SITE_OTHER): Payer: BC Managed Care – PPO | Admitting: Cardiology

## 2022-01-01 VITALS — BP 132/90 | HR 74 | Ht 68.0 in | Wt 172.2 lb

## 2022-01-01 DIAGNOSIS — I2510 Atherosclerotic heart disease of native coronary artery without angina pectoris: Secondary | ICD-10-CM

## 2022-01-01 DIAGNOSIS — Z0000 Encounter for general adult medical examination without abnormal findings: Secondary | ICD-10-CM

## 2022-01-01 DIAGNOSIS — E782 Mixed hyperlipidemia: Secondary | ICD-10-CM | POA: Diagnosis not present

## 2022-01-01 DIAGNOSIS — E785 Hyperlipidemia, unspecified: Secondary | ICD-10-CM

## 2022-01-01 NOTE — Progress Notes (Signed)
?Cardiology Office Note:   ? ?Date:  01/01/2022  ? ?ID:  Darren Scott, DOB 03-May-1971, MRN [id number] ? ?PCP:  Debbrah Alar, NP  ?Cardiologist:  Berniece Salines, DO  ?Electrophysiologist:  None  ? ?Referring MD: Debbrah Alar, NP  ? ?" I am having knee pain" ? ?History of Present Illness:   ? ?Darren Scott is a 51 y.o. male with a hx of Hypertension, hyperlipidemia, coronary artery disease diagnosed by coronary CTA presents today for follow-up visit.  I saw the patient March 2021 at that time   I did educated patient about his new diagnosis and started him on aspirin and statin. ? ?He was seen in July 2021 at that time he was not experiencing any anginal symptoms.  His blood pressure was at target. ? ?He is here today for follow-up visit.  He tells me he is having significant bilateral knee pain.  He has been following with orthopedics.  No other complaints at this time.  No chest pain or shortness of breath. ? ?Past Medical History:  ?Diagnosis Date  ? Acute bronchitis 08/16/2014  ? Arthritis   ? LEFT knee  ? Atypical chest pain 02/20/2018  ? Dyslipidemia 02/20/2018  ? Frequent headaches   ? uses PRN meds  ? GERD (gastroesophageal reflux disease)   ? on meds  ? History of hypertension 12/29/2013  ? Hyperglycemia 12/29/2013  ? Hyperlipidemia   ? Hypertension   ? on meds  ? Other and unspecified hyperlipidemia 12/29/2013  ? on meds  ? Routine general medical examination at a health care facility 12/29/2013  ? Seasonal allergies   ? ? ?Past Surgical History:  ?Procedure Laterality Date  ? KNEE SURGERY Left 2007  ? WISDOM TOOTH EXTRACTION    ? ? ?Current Medications: ?Current Meds  ?Medication Sig  ? amLODipine (NORVASC) 10 MG tablet TAKE 1 TABLET BY MOUTH EVERY DAY  ? aspirin EC 81 MG tablet Take 1 tablet (81 mg total) by mouth daily.  ? esomeprazole (NEXIUM) 20 MG capsule Take 20 mg by mouth daily at 12 noon.  ? metoprolol succinate (TOPROL-XL) 50 MG 24 hr tablet TAKE 1 TABLET BY MOUTH DAILY. TAKE  WITH OR IMMEDIATELY FOLLOWING A MEAL.  ? nortriptyline (PAMELOR) 10 MG capsule Take 1 capsule (10 mg total) by mouth at bedtime. (Patient taking differently: Take 10 mg by mouth daily as needed.)  ? rosuvastatin (CRESTOR) 20 MG tablet TAKE 1 TABLET BY MOUTH EVERY DAY  ?  ? ?Allergies:   Patient has no known allergies.  ? ?Social History  ? ?Socioeconomic History  ? Marital status: Single  ?  Spouse name: Not on file  ? Number of children: Not on file  ? Years of education: Not on file  ? Highest education level: Not on file  ?Occupational History  ? Not on file  ?Tobacco Use  ? Smoking status: Never  ? Smokeless tobacco: Current  ?  Types: Chew  ?Vaping Use  ? Vaping Use: Never used  ?Substance and Sexual Activity  ? Alcohol use: Yes  ?  Alcohol/week: 12.0 - 18.0 standard drinks  ?  Types: 12 - 18 Standard drinks or equivalent per week  ?  Comment: occ  ? Drug use: No  ? Sexual activity: Not on file  ?Other Topics Concern  ? Not on file  ?Social History Narrative  ? 3 children 99 yr old son, 69 yr old daughter, son age 6Separated, has joint custody.  Oldest son lives with his first  wife in [place] as Arboriculturist- replaces winsh[employer]s 2022 "retired" Completed 8th gradeEnjoys- hunting/fishing golfing  ?   ? Left handed  ? ?Social Determinants of Health  ? ?Financial Resource Strain: Not on file  ?Food Insecurity: Not on file  ?Transportation Needs: Not on file  ?Physical Activity: Not on file  ?Stress: Not on file  ?Social Connections: Not on file  ?  ? ?Family History: ?The patient's family history includes Heart attack in his father. There is no history of Cancer, Diabetes, Colon cancer, Colon polyps, Esophageal cancer, Rectal cancer, or Stomach cancer. ? ?ROS:   ?Review of Systems  ?Constitution: Negative for decreased appetite, fever and weight gain.  ?HENT: Negative for congestion, ear discharge, hoarse voice and sore throat.   ?Eyes: Negative for discharge, redness, vision loss in right eye and visual halos.   ?Cardiovascular: Negative for chest pain, dyspnea on exertion, leg swelling, orthopnea and palpitations.  ?Respiratory: Negative for cough, hemoptysis, shortness of breath and snoring.   ?Endocrine: Negative for heat intolerance and polyphagia.  ?Hematologic/Lymphatic: Negative for bleeding problem. Does not bruise/bleed easily.  ?Skin: Negative for flushing, nail changes, rash and suspicious lesions.  ?Musculoskeletal: Negative for arthritis, joint pain, muscle cramps, myalgias, neck pain and stiffness.  ?Gastrointestinal: Negative for abdominal pain, bowel incontinence, diarrhea and excessive appetite.  ?Genitourinary: Negative for decreased libido, genital sores and incomplete emptying.  ?Neurological: Negative for brief paralysis, focal weakness, headaches and loss of balance.  ?Psychiatric/Behavioral: Negative for altered mental status, depression and suicidal ideas.  ?Allergic/Immunologic: Negative for HIV exposure and persistent infections.  ? ? ?EKGs/Labs/Other Studies Reviewed:   ? ?The following studies were reviewed today: ? ? ?EKG:  The ekg ordered today demonstrates sinus rhythm, heart rate 74 bpm ? ?Recent Labs: ?06/29/2021: ALT 42; BUN 16; Creat 1.20; Potassium 4.7; Sodium 141  ?Recent Lipid Panel ?   ?Component Value Date/Time  ? CHOL 168 06/29/2021 1628  ? TRIG 265 (H) 06/29/2021 1628  ? HDL 37 (L) 06/29/2021 1628  ? CHOLHDL 4.5 06/29/2021 1628  ? VLDL 54.4 (H) 08/10/2019 1531  ? [facility] 93 06/29/2021 1628  ? LDLDIRECT 124.0 08/10/2019 1531  ? ? ?Physical Exam:   ? ?VS:  BP 132/90   Pulse 74   Ht 5\' 8"  (1.727 m)   Wt 172 lb 3.2 oz (78.1 kg)   SpO2 98%   BMI 26.18 kg/m?    ? ?Wt Readings from Last 3 Encounters:  ?01/01/22 172 lb 3.2 oz (78.1 kg)  ?09/14/21 173 lb (78.5 kg)  ?09/10/21 176 lb (79.8 kg)  ?  ? ?GEN: Well nourished, well developed in no acute distress ?HEENT: Normal ?NECK: No JVD; No carotid bruits ?LYMPHATICS: No lymphadenopathy ?CARDIAC: S1S2 noted,RRR, no murmurs, rubs,  gallops ?RESPIRATORY:  Clear to auscultation without rales, wheezing or rhonchi  ?ABDOMEN: Soft, non-tender, non-distended, +bowel sounds, no guarding. ?EXTREMITIES: No edema, No cyanosis, no clubbing ?MUSCULOSKELETAL:  No deformity  ?SKIN: Warm and dry ?NEUROLOGIC:  Alert and oriented x 3, non-focal ?PSYCHIATRIC:  Normal affect, good insight ? ?ASSESSMENT:   ? ?1. Coronary artery disease involving native coronary artery of native heart without angina pectoris   ?2. Dyslipidemia   ?3. Examination   ?4. Mixed hyperlipidemia   ? ?PLAN:   ? ?CAD-no angina symptoms.  Continue aspirin and statin. ?Hyperlipidemia-continue Crestor.  We will get lipid profile LP(a) today. ?Hypertension-diastolic blood pressure slightly elevated.  We discussed salt intake.  He is going to watch his salt.  No need for any  medication changes at this time. ? ?The patient is in agreement with the above plan. The patient left the office in stable condition.  The patient will follow up in 1 year ? ? ?Medication Adjustments/Labs and Tests Ordered: ?Current medicines are reviewed at length with the patient today.  Concerns regarding medicines are outlined above.  ?Orders Placed This Encounter  ?Procedures  ? Lipid panel  ? Lipoprotein A (LPA)  ? EKG 12-Lead  ? ?No orders of the defined types were placed in this encounter. ? ? ?Patient Instructions  ?Medication Instructions:  ?Your physician recommends that you continue on your current medications as directed. Please refer to the Current Medication list given to you today.  ?*If you need a refill on your cardiac medications before your next appointment, please call your pharmacy* ? ? ?Lab Work: ?Your physician recommends that you return for lab work in:  ?TODAY: Lipids, Lpa ?If you have labs (blood work) drawn today and your tests are completely normal, you will receive your results only by: ?MyChart Message (if you have MyChart) OR ?A paper copy in the mail ?If you have any lab test that is abnormal  or we need to change your treatment, we will call you to review the results. ? ? ?Testing/Procedures: ?None ? ? ?Follow-Up: ?At [facility], you and your health needs are our priority.  As part of our continuing miss

## 2022-01-01 NOTE — Patient Instructions (Addendum)
Medication Instructions:  ?Your physician recommends that you continue on your current medications as directed. Please refer to the Current Medication list given to you today.  ?*If you need a refill on your cardiac medications before your next appointment, please call your pharmacy* ? ? ?Lab Work: ?Your physician recommends that you return for lab work in:  ?TODAY: Lipids, Lpa ?If you have labs (blood work) drawn today and your tests are completely normal, you will receive your results only by: ?MyChart Message (if you have MyChart) OR ?A paper copy in the mail ?If you have any lab test that is abnormal or we need to change your treatment, we will call you to review the results. ? ? ?Testing/Procedures: ?None ? ? ?Follow-Up: ?At [facility], you and your health needs are our priority.  As part of our continuing mission to provide you with exceptional heart care, we have created designated Provider Care Teams.  These Care Teams include your primary Cardiologist (physician) and Advanced Practice Providers (APPs -  Physician Assistants and Nurse Practitioners) who all work together to provide you with the care you need, when you need it. ? ?We recommend signing up for the patient portal called "MyChart".  Sign up information is provided on this After Visit Summary.  MyChart is used to connect with patients for Virtual Visits (Telemedicine).  Patients are able to view lab/test results, encounter notes, upcoming appointments, etc.  Non-urgent messages can be sent to your provider as well.   ?To learn more about what you can do with MyChart, go to [email or web address].   ? ?Your next appointment:   ?1 year(s) ? ?The format for your next appointment:   ?In Person ? ?Provider:   ?[name], DO   ? ? ?Other Instructions ? ? ?Important Information About Sugar ? ? ? ? ?  ?

## 2022-01-02 ENCOUNTER — Ambulatory Visit (INDEPENDENT_AMBULATORY_CARE_PROVIDER_SITE_OTHER): Payer: BC Managed Care – PPO | Admitting: Family

## 2022-01-02 ENCOUNTER — Other Ambulatory Visit: Payer: Self-pay

## 2022-01-02 ENCOUNTER — Encounter: Payer: Self-pay | Admitting: Family

## 2022-01-02 VITALS — BP 136/84 | HR 73 | Temp 98.8°F | Resp 16 | Ht 68.0 in | Wt 172.0 lb

## 2022-01-02 DIAGNOSIS — Z0000 Encounter for general adult medical examination without abnormal findings: Secondary | ICD-10-CM | POA: Diagnosis not present

## 2022-01-02 DIAGNOSIS — E785 Hyperlipidemia, unspecified: Secondary | ICD-10-CM

## 2022-01-02 DIAGNOSIS — F1010 Alcohol abuse, uncomplicated: Secondary | ICD-10-CM

## 2022-01-02 DIAGNOSIS — E782 Mixed hyperlipidemia: Secondary | ICD-10-CM

## 2022-01-02 DIAGNOSIS — Z716 Tobacco abuse counseling: Secondary | ICD-10-CM

## 2022-01-02 DIAGNOSIS — Z1159 Encounter for screening for other viral diseases: Secondary | ICD-10-CM | POA: Diagnosis not present

## 2022-01-02 LAB — LIPID PANEL
Chol/HDL Ratio: 4.4 ratio (ref 0.0–5.0)
Cholesterol, Total: 167 mg/dL (ref 100–199)
HDL: 38 mg/dL — ABNORMAL LOW (ref >39)
LDL Chol Calc (NIH): 93 mg/dL (ref 0–99)
Triglycerides: 212 mg/dL — ABNORMAL HIGH (ref 0–149)
VLDL Cholesterol Cal: 36 mg/dL (ref 5–40)

## 2022-01-02 LAB — LIPOPROTEIN A (LPA): Lipoprotein (a): 250.8 nmol/L — ABNORMAL HIGH (ref <75.0)

## 2022-01-02 NOTE — Progress Notes (Signed)
Referral to [facility] made.  ?

## 2022-01-02 NOTE — Assessment & Plan Note (Signed)
Counseled pt on importance of discontinuation of chewing tobacco use.  ?

## 2022-01-02 NOTE — Assessment & Plan Note (Signed)
Patient counseled to cut back alcohol consumption to 2 or less drinks/day.  ?

## 2022-01-02 NOTE — Progress Notes (Signed)
? ?Subjective:  ? ?By signing my name below, I, Shehryar Baig, attest that this documentation has been prepared under the direction and in the presence of [name], NP. 01/02/2022 ?   ? ? Patient ID: Darren Scott, male    DOB: 1971-03-19, 51 y.o.   MRN: [id number] ? ?Chief Complaint  ?Patient presents with  ? Annual Exam  ? ? ?HPI ?Patient is in today for a comprehensive physical exam.  ? ?Wheezing sound- He reports at night before he sleeps and is laying down he occasionally hears a wheezing sound from his nasal sinus area.  ?Headaches- He complained of headaches during his last visit and reports his symptoms have resolved at this time.  ?Swelling in knees- He continues having frequent swelling and pain in his knees. He has an upcomming appointment with another provider to manage his symptoms. He takes Voltaren gel to manage his swelling and pain and found relief.  ?Blood pressure- He continues taking 50 mg metoprolol succinate daily PO, 10 mg amlodipine daily PO and reports doing no new issues while taking them. He continues seeing his cardiologist and reports seeing her yesterday.  ?BP Readings from Last 3 Encounters:  ?01/02/22 136/84  ?01/01/22 132/90  ?09/14/21 138/81  ? ?Pulse Readings from Last 3 Encounters:  ?01/02/22 73  ?01/01/22 74  ?09/14/21 76  ? ?He denies having any fever, new muscle pain, joint pain, new moles, congestion, sinus pain, sore throat, chest pain, palpations, cough, SOB, wheezing, n/v/d, constipation, blood in stool, dysuria, frequency, weight change, adenopathy, hematuria, depression, anxiety, headaches at this time. ?Social history: He has no changes to his family medical history. He has no surgical procedures in the past year. He drinks around 10-12 cans of beer daily. He is planning on reducing the amount he drinks. He does not use vaping products. He does not use smoking tobacco products. He continues using chewing tobacco products. He is trying to quit but has no  success.  ?Immunizations: He is UTD on tetanus vaccines. He is not interested in receiving the flu vaccine nor the Covid-19 vaccines.  ?Diet: He is not maintaining a healthy diet.  ?Exercise: He does not participate in regular exercise due to knee swelling.  ?Colonoscopy: Last completed 09/07/2021. Results showed: ?- The entire examined colon is normal on direct and retroflexion views. ?- No specimens collected. ?Otherwise results are normal. Repeat in 10 years.  ?Dental: He is UTD on dental care.  ?Vision: He is due for vision care and is planning on scheduling an appointment.  ? ? ?Health Maintenance Due  ?Topic Date Due  ? Hepatitis C Screening  Never done  ? ? ?Past Medical History:  ?Diagnosis Date  ? Acute bronchitis 08/16/2014  ? Arthritis   ? LEFT knee  ? Atypical chest pain 02/20/2018  ? Dyslipidemia 02/20/2018  ? Frequent headaches   ? uses PRN meds  ? GERD (gastroesophageal reflux disease)   ? on meds  ? History of hypertension 12/29/2013  ? Hyperglycemia 12/29/2013  ? Hyperlipidemia   ? Hypertension   ? on meds  ? Other and unspecified hyperlipidemia 12/29/2013  ? on meds  ? Routine general medical examination at a health care facility 12/29/2013  ? Seasonal allergies   ? ? ?Past Surgical History:  ?Procedure Laterality Date  ? KNEE SURGERY Left 2007  ? WISDOM TOOTH EXTRACTION    ? ? ?Family History  ?Problem Relation Age of Onset  ? Heart attack Father   ?  died in his 1950's, smoker  ? Cancer Neg Hx   ? Diabetes Neg Hx   ? Colon cancer Neg Hx   ? Colon polyps Neg Hx   ? Esophageal cancer Neg Hx   ? Rectal cancer Neg Hx   ? Stomach cancer Neg Hx   ? ? ?Social History  ? ?Socioeconomic History  ? Marital status: Single  ?  Spouse name: Not on file  ? Number of children: Not on file  ? Years of education: Not on file  ? Highest education level: Not on file  ?Occupational History  ? Not on file  ?Tobacco Use  ? Smoking status: Never  ? Smokeless tobacco: Current  ?  Types: Chew  ?Vaping Use  ? Vaping Use:  Never used  ?Substance and Sexual Activity  ? Alcohol use: Yes  ?  Alcohol/week: 12.0 - 18.0 standard drinks  ?  Types: 12 - 18 Standard drinks or equivalent per week  ? Drug use: No  ? Sexual activity: Not on file  ?Other Topics Concern  ? Not on file  ?Social History Narrative  ? 3 children 51 yr old son, 51 yr old daughter, son age 816 Separated, has joint custody.  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   ? Left handed  ? ?Social Determinants of Health  ? ?Financial Resource Strain: Not on file  ?Food Insecurity: Not on file  ?Transportation Needs: Not on file  ?Physical Activity: Not on file  ?Stress: Not on file  ?Social Connections: Not on file  ?Intimate Partner Violence: Not on file  ? ? ?Outpatient Medications Prior to Visit  ?Medication Sig Dispense Refill  ? amLODipine (NORVASC) 10 MG tablet TAKE 1 TABLET BY MOUTH EVERY DAY 90 tablet 1  ? aspirin EC 81 MG tablet Take 1 tablet (81 mg total) by mouth daily. 90 tablet 3  ? esomeprazole (NEXIUM) 20 MG capsule Take 20 mg by mouth daily at 12 noon.    ? metoprolol succinate (TOPROL-XL) 50 MG 24 hr tablet TAKE 1 TABLET BY MOUTH DAILY. TAKE WITH OR IMMEDIATELY FOLLOWING A MEAL. 90 tablet 1  ? nortriptyline (PAMELOR) 10 MG capsule Take 1 capsule (10 mg total) by mouth at bedtime. (Patient taking differently: Take 10 mg by mouth daily as needed.) 30 capsule 5  ? rosuvastatin (CRESTOR) 20 MG tablet TAKE 1 TABLET BY MOUTH EVERY DAY 90 tablet 3  ? ?No facility-administered medications prior to visit.  ? ? ?No Known Allergies ? ?Review of Systems  ?Constitutional:  Negative for fever.  ?     (-)unexpected weight change ?(-)Adenopathy  ?HENT:  Negative for congestion, hearing loss, sinus pain and sore throat.   ?     (-)Rhinorrhea   ?Eyes:   ?     (-)Visual disturbance  ?Respiratory:  Negative for cough, shortness of breath and wheezing.   ?Cardiovascular:   Negative for chest pain, palpitations and leg swelling.  ?Gastrointestinal:  Negative for blood in stool, constipation, diarrhea, nausea and vomiting.  ?Genitourinary:  Negative for dysuria, frequency and hematuria.  ?Musculoskeletal:  Negative for joint pain and myalgias.  ?     (+)bilateral knee swelling and pain  ?Skin:   ?     (-)new moles  ?Neurological:  Negative for headaches.  ?Psychiatric/Behavioral:  Negative for depression. The patient is not nervous/anxious.   ? ?   ?Objective:  ?  ?  Physical Exam ?Constitutional:   ?   General: He is not in acute distress. ?   Appearance: Normal appearance. He is not ill-appearing.  ?HENT:  ?   Head: Normocephalic and atraumatic.  ?   Right Ear: Tympanic membrane, ear canal and external ear normal.  ?   Left Ear: Tympanic membrane, ear canal and external ear normal.  ?   Nose: No septal deviation.  ?   Right Nostril: No occlusion.  ?   Left Nostril: No occlusion.  ?   Right Turbinates: Not enlarged or swollen.  ?   Left Turbinates: Not enlarged or swollen.  ?Eyes:  ?   Extraocular Movements: Extraocular movements intact.  ?   Right eye: No nystagmus.  ?   Left eye: No nystagmus.  ?   Pupils: Pupils are equal, round, and reactive to light.  ?Cardiovascular:  ?   Rate and Rhythm: Normal rate and regular rhythm.  ?   Heart sounds: Normal heart sounds. No murmur heard. ?  No gallop.  ?Pulmonary:  ?   Effort: Pulmonary effort is normal. No respiratory distress.  ?   Breath sounds: Normal breath sounds. No wheezing or rales.  ?Abdominal:  ?   General: Bowel sounds are normal. There is no distension.  ?   Palpations: Abdomen is soft.  ?   Tenderness: There is no abdominal tenderness. There is no guarding.  ?Musculoskeletal:  ?   Comments: 5/5 strength in both upper and lower extremities  ?Lymphadenopathy:  ?   Cervical: No cervical adenopathy.  ?Skin: ?   General: Skin is warm and dry.  ?Neurological:  ?   Mental Status: He is alert and oriented to person, place, and time.  ?    Deep Tendon Reflexes:  ?   Reflex Scores: ?     Patellar reflexes are 2+ on the right side and 2+ on the left side. ?Psychiatric:     ?   Judgment: Judgment normal.  ? ? ?BP 136/84 (BP Location: Right A

## 2022-01-02 NOTE — Assessment & Plan Note (Addendum)
Wt Readings from Last 3 Encounters:  ?01/02/22 172 lb (78 kg)  ?01/01/22 172 lb 3.2 oz (78.1 kg)  ?09/14/21 173 lb (78.5 kg)  ? ?Discussed healthy diet, exercise and weight loss. Colo up to date. Declines any immunizations other than his tetanus shot which is up to date.  ?

## 2022-01-02 NOTE — Patient Instructions (Signed)
Please complete lab work prior to leaving.   

## 2022-01-03 LAB — HEPATITIS C ANTIBODY
Hepatitis C Ab: NONREACTIVE
SIGNAL TO CUT-OFF: 0.03 (ref <1.00)

## 2022-01-14 NOTE — Progress Notes (Signed)
? ?Office Visit Note ? ?Patient: Darren Scott Bernard             ?Date of Birth: 09/21/1971           ?MRN: [id number]             ?PCP: [name], [name], NP ?Referring: [name], [name], NP ?Visit Date: 01/15/2022 ? ? ?Subjective:  ?Follow-up (Bil knee swelling and pain, no improvement) ? ? ?History of Present Illness: Darren Scott Conkel is a 51 y.o. male here for follow up for bilateral left worse than right knee pain and swelling. Since our last visit the problem is still about the same. Swelling comes and goes and often also swells in the back of the knee. Pain gets worse any time he is stationary for a prolonged time or in the morning. Also has more pain on days following more time kneeling for work. ? ?Previous HPI ?09/14/21 ?Darren Scott Basley is a 51 y.o. male here for bilateral knee pain suspected inflammatory problem. He has left worse than right knee pain and swelling. This started about 2-3 months ago abruptly he does not recall any preceding injury or medical event. Mostly bothering medial side of the knee. Pain is worst when provoked by certain movement or positions especially kneeling down or when sitting in a fixed position or driving. He did not notice any benefit with oral NSAIDs. This was treated with local steroid injection in November which helped for about 3 weeks total. Xray of the knee was mostly unremarkable. He had left knee arthroscopic debridement in 2007 with not ongoing problems after that time. ?  ?Imaging reviewed ?07/30/21 Xray right knee ?IMPRESSION: ?Mild medial compartment degenerative change.  No acute abnormality. ?  ?07/30/21 Xray left knee ?IMPRESSION: ?1. Corticated bony density noted along the superolateral aspect of patella consistent by point Tate patella or old patellar fracture. No acute bony abnormality identified. ?2. Mild medial compartment and patellofemoral degenerative change. ?3. Tiny knee joint effusion cannot be excluded. ? ? ?Review of Systems  ?Constitutional:   Negative for fatigue.  ?HENT:  Negative for mouth dryness.   ?Eyes:  Negative for dryness.  ?Respiratory:  Negative for shortness of breath.   ?Cardiovascular:  Positive for swelling in legs/feet.  ?Gastrointestinal:  Negative for constipation.  ?Endocrine: Positive for excessive thirst.  ?Genitourinary:  Negative for difficulty urinating.  ?Musculoskeletal:  Positive for joint pain, joint pain, joint swelling and morning stiffness.  ?Skin:  Negative for rash.  ?Allergic/Immunologic: Negative for susceptible to infections.  ?Neurological:  Positive for numbness.  ?Hematological:  Negative for bruising/bleeding tendency.  ?Psychiatric/Behavioral:  Positive for sleep disturbance.   ? ?PMFS History:  ?Patient Active Problem List  ? Diagnosis Date Noted  ? Effusion, left knee 01/15/2022  ? Alcohol abuse 01/02/2022  ? Tobacco abuse counseling 01/02/2022  ? Examination 01/01/2022  ? Coronary artery disease involving native coronary artery of native heart without angina pectoris 01/01/2022  ? Mixed hyperlipidemia 01/01/2022  ? Pain in left knee 09/14/2021  ? Chronic nonintractable headache 06/29/2021  ? Atypical chest pain 02/20/2018  ? Dyslipidemia 02/20/2018  ? Routine general medical examination at a health care facility 12/29/2013  ? History of hypertension 12/29/2013  ? Other and unspecified hyperlipidemia 12/29/2013  ? Hyperglycemia 12/29/2013  ?  ?Past Medical History:  ?Diagnosis Date  ? Acute bronchitis 08/16/2014  ? Arthritis   ? LEFT knee  ? Atypical chest pain 02/20/2018  ? Dyslipidemia 02/20/2018  ? Frequent headaches   ? uses PRN meds  ?  GERD (gastroesophageal reflux disease)   ? on meds  ? History of hypertension 12/29/2013  ? Hyperglycemia 12/29/2013  ? Hyperlipidemia   ? Hypertension   ? on meds  ? Other and unspecified hyperlipidemia 12/29/2013  ? on meds  ? Routine general medical examination at a health care facility 12/29/2013  ? Seasonal allergies   ?  ?Family History  ?Problem Relation Age of Onset   ? Heart attack Father   ?     died in his 65's, smoker  ? Cancer Neg Hx   ? Diabetes Neg Hx   ? Colon cancer Neg Hx   ? Colon polyps Neg Hx   ? Esophageal cancer Neg Hx   ? Rectal cancer Neg Hx   ? Stomach cancer Neg Hx   ? ?Past Surgical History:  ?Procedure Laterality Date  ? KNEE SURGERY Left 2007  ? WISDOM TOOTH EXTRACTION    ? ?Social History  ? ?Social History Narrative  ? 3 children 38 yr old son, 49 yr old daughter, son age 51 Separated, has joint custody.  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   ? Left handed  ? ?Immunization History  ?Administered Date(s) Administered  ? Tdap 12/29/2013  ?  ? ?Objective: ?Vital Signs: BP 127/70 (BP Location: Left Arm, Patient Position: Sitting, Cuff Size: Normal)   Pulse 81   Resp 15   Ht 5\' 8"  (1.727 m)   Wt 172 lb (78 kg)   BMI 26.15 kg/m?   ? ?Physical Exam ?Cardiovascular:  ?   Rate and Rhythm: Normal rate and regular rhythm.  ?Pulmonary:  ?   Effort: Pulmonary effort is normal.  ?   Breath sounds: Normal breath sounds.  ?Musculoskeletal:  ?   Right lower leg: No edema.  ?   Left lower leg: No edema.  ?Skin: ?   General: Skin is warm and dry.  ?Neurological:  ?   Mental Status: He is alert.  ?Psychiatric:     ?   Mood and Affect: Mood normal.  ?  ? ?Musculoskeletal Exam:  ?Shoulders full ROM no tenderness or swelling ?Elbows full ROM no tenderness or swelling ?Wrists full ROM no tenderness or swelling ?Fingers full ROM no tenderness or swelling ?Bilateral knee crepitus present, left knee with palpable effusion slightly decreased flexion and extension range of motion, no warmth or erythema ?Ankles full ROM no tenderness or swelling ? ? ?Investigation: ?No additional findings. ? ?Imaging: ?No results found. ? ?Recent Labs: ?Lab Results  ?Component Value Date  ? WBC 7.3 08/10/2019  ? HGB 14.6 08/10/2019  ? PLT 230.0 08/10/2019  ? NA 141 06/29/2021  ? K  4.7 06/29/2021  ? CL 104 06/29/2021  ? CO2 31 06/29/2021  ? GLUCOSE 100 (H) 06/29/2021  ? BUN 16 06/29/2021  ? CREATININE 1.20 06/29/2021  ? BILITOT 0.5 06/29/2021  ? ALKPHOS 69 08/10/2019  ? AST 26 06/29/2021  ? ALT 42 06/29/2021  ? PROT 7.2 06/29/2021  ? ALBUMIN 4.6 08/10/2019  ? CALCIUM 10.1 06/29/2021  ? GFRAA 91 10/07/2019  ? ? ?Speciality Comments: No specialty comments available. ? ?Procedures:  ?Large Joint Inj: R knee on 01/15/2022 1:40 PM ?Details: 18 G 1.5 in needle, superolateral approach ?Medications: 4 mL lidocaine 1 % ?Aspirate: 18 mL clear and yellow; sent for lab analysis ?Outcome: tolerated well, no immediate complications ?Procedure, treatment alternatives, risks and benefits explained, specific  risks discussed. Consent was given by the patient. Immediately prior to procedure a time out was called to verify the correct patient, procedure, equipment, support staff and site/side marked as required. Patient was prepped and draped in the usual sterile fashion.  ? ? ?Allergies: Patient has no known allergies.  ? ?Assessment / Plan:     ?Visit Diagnoses: Chronic pain of left knee - Plan: Synovial Fluid Analysis, Complete, Large Joint Inj: R knee ?Effusion, left knee - Plan: Large Joint Inj: R knee ? ?Still having recurrent effusions and pain in the left knee not typical for any specific process on his lab work findings so far.  Performing left knee aspiration for synovial fluid analysis.  If this does look inflammatory consider trial of DMARD treatment such as sulfasalazine.  I think if fluid is bland would attribute this more likely to structural or posttraumatic issues in the knee and recommend him to follow-up in orthopedics clinic or whether advanced imaging is warranted. ? ? ?Orders: ?Orders Placed This Encounter  ?Procedures  ? Large Joint Inj: R knee  ? Synovial Fluid Analysis, Complete  ? ?No orders of the defined types were placed in this encounter. ? ? ? ?Follow-Up Instructions: No follow-ups  on file. ? ? ?Fuller Plan, MD ? ?Note - This record has been created using [employer].  ?Chart creation errors have been sought, but may not always  ?have been located. Such creation errors do n

## 2022-01-15 ENCOUNTER — Ambulatory Visit (INDEPENDENT_AMBULATORY_CARE_PROVIDER_SITE_OTHER): Payer: BC Managed Care – PPO | Admitting: Internal Medicine

## 2022-01-15 ENCOUNTER — Encounter: Payer: Self-pay | Admitting: Internal Medicine

## 2022-01-15 VITALS — BP 127/70 | HR 81 | Resp 15 | Ht 68.0 in | Wt 172.0 lb

## 2022-01-15 DIAGNOSIS — M25561 Pain in right knee: Secondary | ICD-10-CM | POA: Diagnosis not present

## 2022-01-15 DIAGNOSIS — M25562 Pain in left knee: Secondary | ICD-10-CM | POA: Diagnosis not present

## 2022-01-15 DIAGNOSIS — G8929 Other chronic pain: Secondary | ICD-10-CM

## 2022-01-15 DIAGNOSIS — M25462 Effusion, left knee: Secondary | ICD-10-CM | POA: Diagnosis not present

## 2022-01-15 LAB — SYNOVIAL FLUID ANALYSIS, COMPLETE
Basophils, %: 0 %
Eosinophils-Synovial: 0 % (ref 0–2)
Lymphocytes-Synovial Fld: 15 % (ref 0–74)
Monocyte/Macrophage: 81 % — ABNORMAL HIGH (ref 0–69)
Neutrophil, Synovial: 2 % (ref 0–24)
Synoviocytes, %: 2 % (ref 0–15)
WBC, Synovial: 281 cells/uL — ABNORMAL HIGH (ref <150)

## 2022-01-17 MED ORDER — LIDOCAINE HCL 1 % IJ SOLN
4.0000 mL | INTRAMUSCULAR | Status: AC | PRN
  Administered 2022-01-15: 4 mL

## 2022-01-17 NOTE — Progress Notes (Signed)
Knee fluid analysis does not show significant inflammation. This really suggests the problem is more structural or chronic injury related. He would benefit to follow up in orthopedic surgery office for this. He was seeing Dr. Steward Drone should not need referral unless trying to change offices. ?He may need more advanced imaging to evaluate this too but I would defer that to orthopedist.

## 2022-01-18 ENCOUNTER — Telehealth: Payer: Self-pay | Admitting: Family

## 2022-01-18 NOTE — Telephone Encounter (Signed)
See mychart.  

## 2022-02-14 ENCOUNTER — Other Ambulatory Visit: Payer: Self-pay

## 2022-02-14 ENCOUNTER — Emergency Department (HOSPITAL_BASED_OUTPATIENT_CLINIC_OR_DEPARTMENT_OTHER): Payer: BC Managed Care – PPO

## 2022-02-14 ENCOUNTER — Emergency Department (HOSPITAL_BASED_OUTPATIENT_CLINIC_OR_DEPARTMENT_OTHER)
Admission: EM | Admit: 2022-02-14 | Discharge: 2022-02-14 | Disposition: A | Discharge status: Home / Self Care | Payer: BC Managed Care – PPO | Attending: Emergency Medicine | Admitting: Emergency Medicine

## 2022-02-14 ENCOUNTER — Encounter (HOSPITAL_BASED_OUTPATIENT_CLINIC_OR_DEPARTMENT_OTHER): Payer: Self-pay | Admitting: Emergency Medicine

## 2022-02-14 DIAGNOSIS — M25561 Pain in right knee: Secondary | ICD-10-CM | POA: Insufficient documentation

## 2022-02-14 DIAGNOSIS — I10 Essential (primary) hypertension: Secondary | ICD-10-CM | POA: Diagnosis not present

## 2022-02-14 DIAGNOSIS — Z7982 Long term (current) use of aspirin: Secondary | ICD-10-CM | POA: Insufficient documentation

## 2022-02-14 DIAGNOSIS — Z79899 Other long term (current) drug therapy: Secondary | ICD-10-CM | POA: Insufficient documentation

## 2022-02-14 IMAGING — CR DG KNEE COMPLETE 4+V*R*
4 series · 4 of 4 positions shown · non-contrast
Comparison: Right knee x-ray [DATE].

CLINICAL DATA: Injury.

EXAM:
RIGHT KNEE - COMPLETE 4+ VIEW

[t knee ap right]
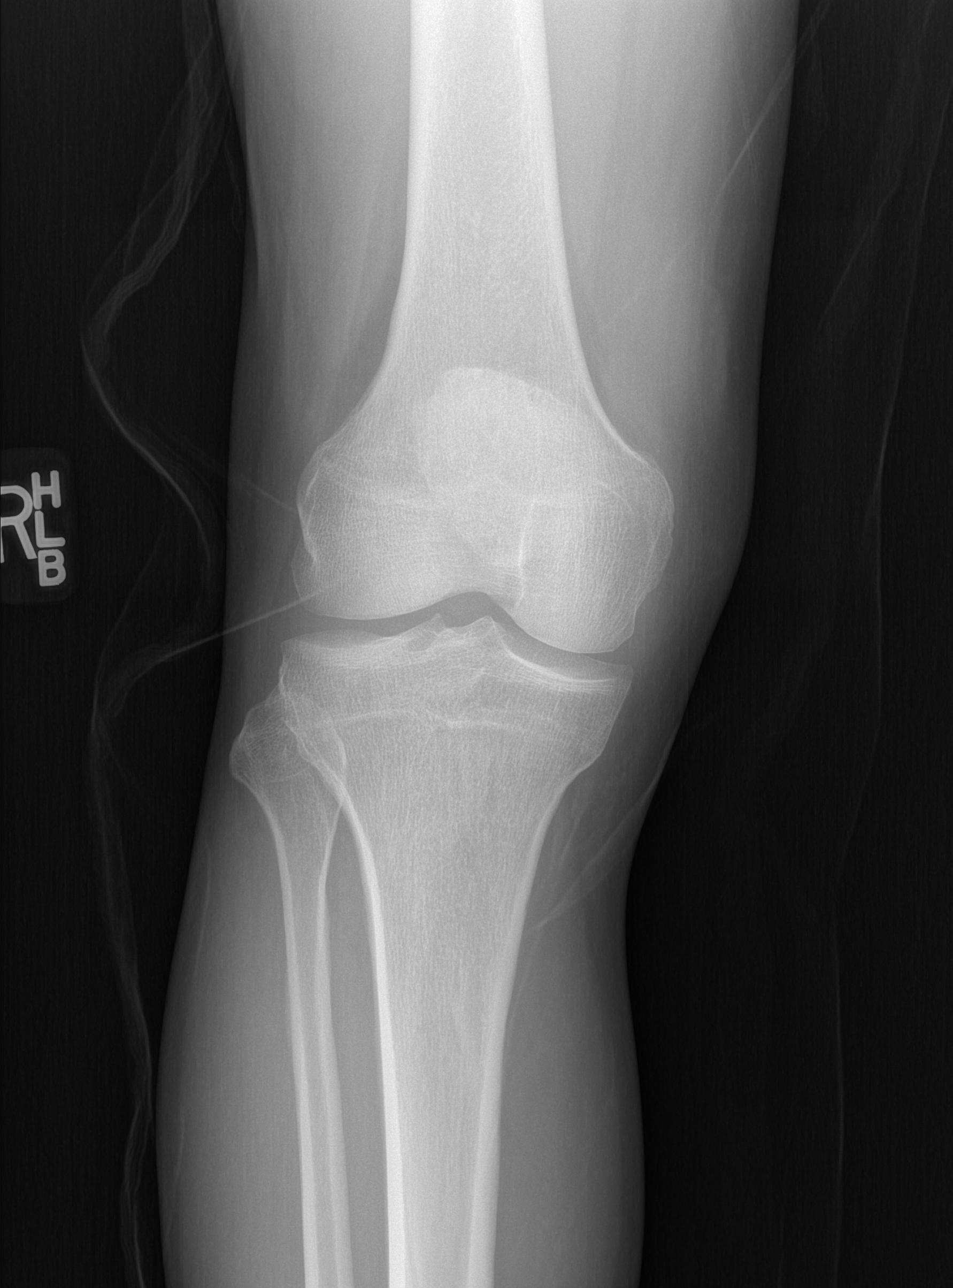

[t knee oblique right (1 of 2)]
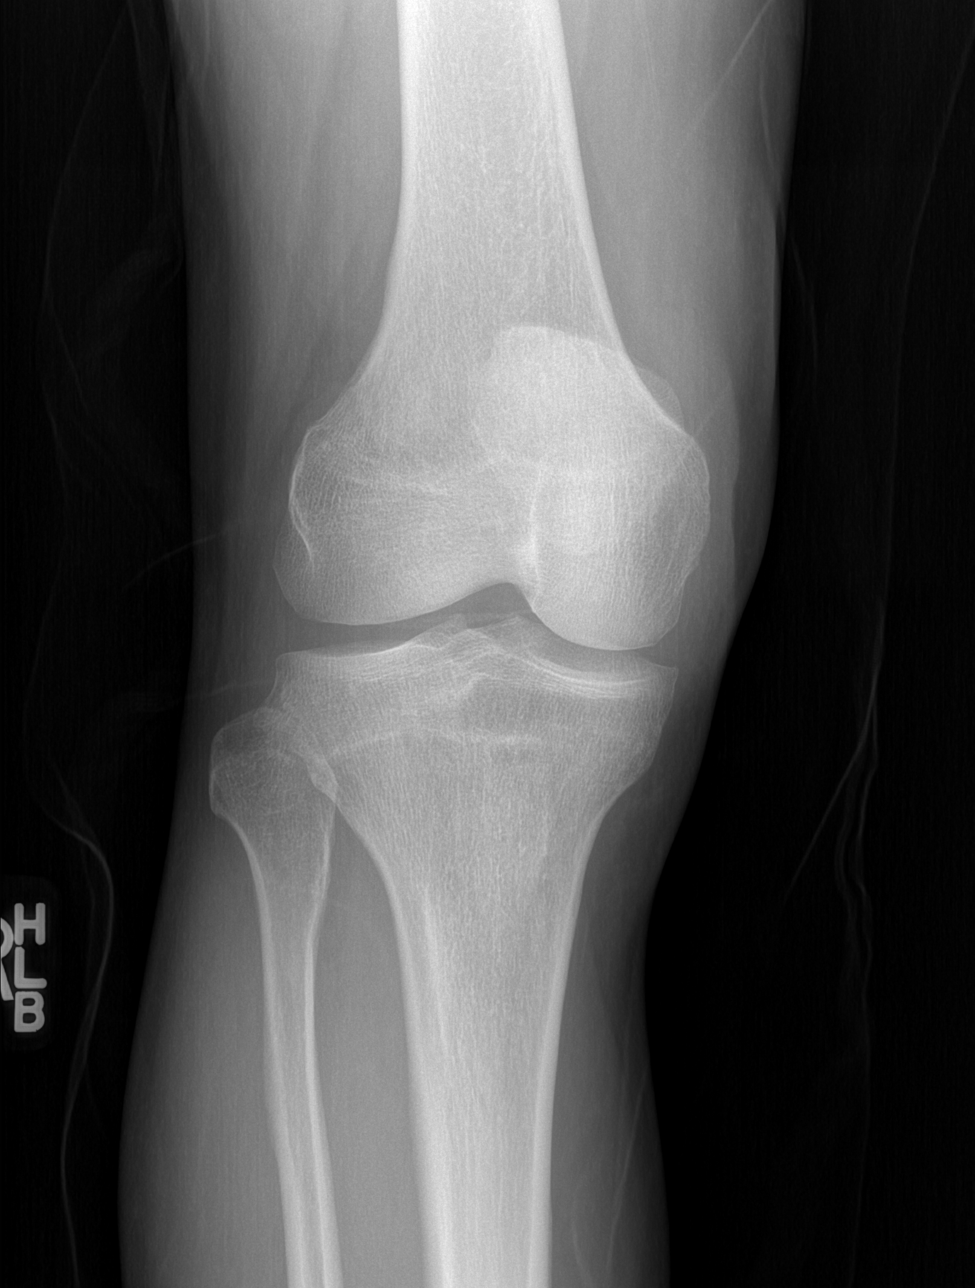

[t knee oblique right (2 of 2)]
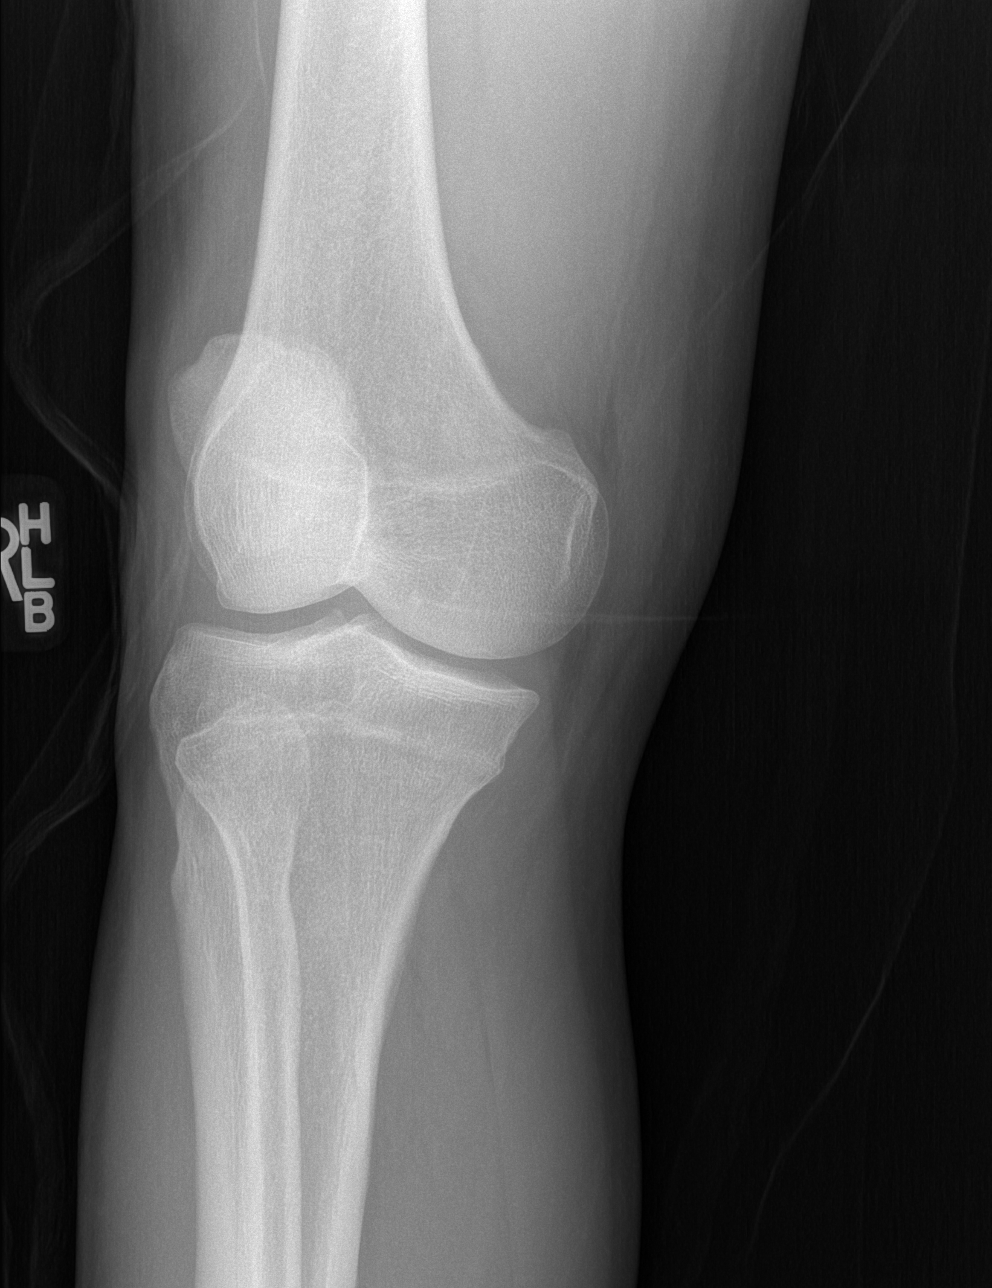

[t knee lat right]
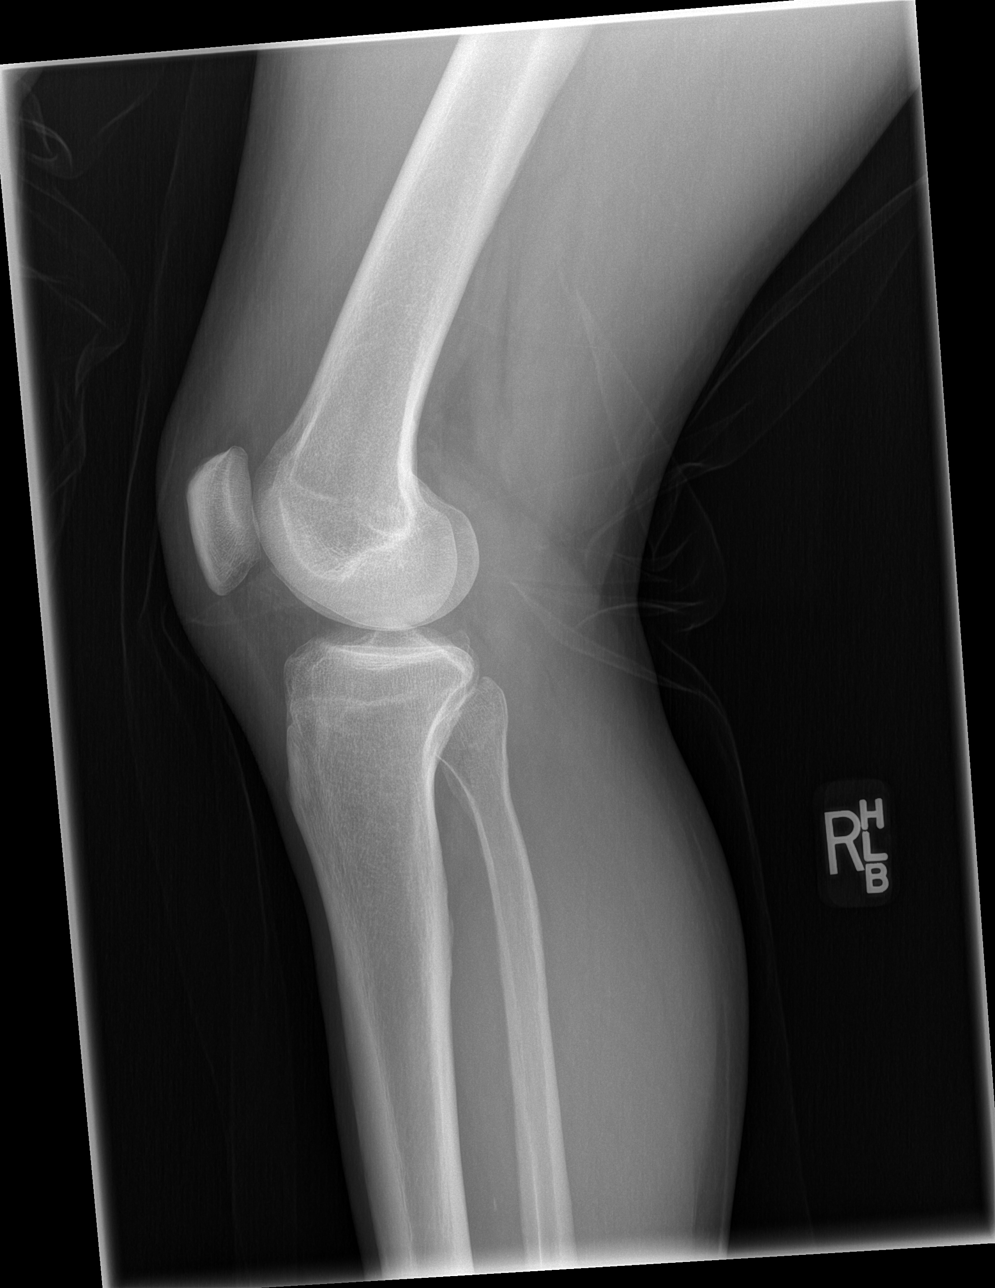

[4 of 4 positions shown; findings below may reference images not displayed]

FINDINGS: No evidence of fracture, dislocation, or joint effusion. No evidence
of arthropathy or other focal bone abnormality. Soft tissues are
unremarkable.
IMPRESSION: Negative.

## 2022-02-14 NOTE — ED Provider Notes (Signed)
[facility] EMERGENCY DEPARTMENT Provider Note   CSN: [id number] Arrival date & time: 02/14/22  1709     History  Chief Complaint  Patient presents with   Knee Pain    right    Darren Scott is a 51 y.o. male.  HPI Patient is a 51 year old male with a history of hypertension, hyperlipidemia, effusion, who presents to the emergency department due to right knee pain.  Patient states that he has been having intermittent pain and swelling in the bilateral knees for many months.  States that it worsens when standing for long periods of time or ambulating more than normal.  While the patient was mowing earlier today his right knee buckled and he fell to the ground.  He reports sudden onset pain in the anterior portion of the knee.  No numbness or weakness.  No head trauma.    Home Medications Prior to Admission medications   Medication Sig Start Date End Date Taking? Authorizing Provider  amLODipine (NORVASC) 10 MG tablet TAKE 1 TABLET BY MOUTH EVERY DAY 12/28/21   [name], NP  aspirin EC 81 MG tablet Take 1 tablet (81 mg total) by mouth daily. 11/24/19   Tobb, Kardie, DO  esomeprazole (NEXIUM) 20 MG capsule Take 20 mg by mouth daily at 12 noon.    [provider]  metoprolol succinate (TOPROL-XL) 50 MG 24 hr tablet TAKE 1 TABLET BY MOUTH DAILY. TAKE WITH OR IMMEDIATELY FOLLOWING A MEAL. 12/28/21   [name], NP  nortriptyline (PAMELOR) 10 MG capsule Take 1 capsule (10 mg total) by mouth at bedtime. Patient taking differently: Take 10 mg by mouth daily as needed. 08/01/21   Everlena Cooper, Adam R, DO  rosuvastatin (CRESTOR) 20 MG tablet TAKE 1 TABLET BY MOUTH EVERY DAY 03/08/21   Tobb, Kardie, DO      Allergies    Patient has no known allergies.    Review of Systems   Review of Systems  Musculoskeletal:  Positive for arthralgias and myalgias.  Skin:  Negative for wound.  Neurological:  Negative for weakness and numbness.   Physical Exam Updated Vital  Signs BP 132/88 (BP Location: Left Arm)   Pulse 87   Temp 97.7 F (36.5 C) (Oral)   Resp 18   Ht 5\' 8"  (1.727 m)   Wt 78 kg   SpO2 97%   BMI 26.15 kg/m  Physical Exam Vitals and nursing note reviewed.  Constitutional:      General: He is not in acute distress.    Appearance: He is well-developed.  HENT:     Head: Normocephalic and atraumatic.     Right Ear: External ear normal.     Left Ear: External ear normal.  Eyes:     General: No scleral icterus.       Right eye: No discharge.        Left eye: No discharge.     Conjunctiva/sclera: Conjunctivae normal.  Neck:     Trachea: No tracheal deviation.  Cardiovascular:     Rate and Rhythm: Normal rate.  Pulmonary:     Effort: Pulmonary effort is normal. No respiratory distress.     Breath sounds: No stridor.  Abdominal:     General: There is no distension.  Musculoskeletal:        General: Tenderness present. No swelling or deformity.     Cervical back: Neck supple.     Comments: Right knee: Moderate tenderness noted overlying the patella.  No increased warmth noted  in the joint.  No overlying skin changes.  Negative anterior/posterior drawer.  No laxity noted with varus/valgus stress, though difficult to assess due to patient's pain.  Distal sensation intact.  Palpable pedal pulses.  Skin:    General: Skin is warm and dry.     Findings: No rash.  Neurological:     General: No focal deficit present.     Mental Status: He is alert and oriented to person, place, and time.     Cranial Nerves: Cranial nerve deficit: no gross deficits.   ED Results / Procedures / Treatments   Labs (all labs ordered are listed, but only abnormal results are displayed) Labs Reviewed - No data to display  EKG None  Radiology DG Knee Complete 4 Views Right  Result Date: 02/14/2022 CLINICAL DATA:  Injury. EXAM: RIGHT KNEE - COMPLETE 4+ VIEW COMPARISON:  Right knee x-ray 07/30/2021. FINDINGS: No evidence of fracture, dislocation, or joint  effusion. No evidence of arthropathy or other focal bone abnormality. Soft tissues are unremarkable. IMPRESSION: Negative. Electronically Signed   By: Darliss Cheney M.D.   On: 02/14/2022 17:41    Procedures Procedures   Medications Ordered in ED Medications - No data to display  ED Course/ Medical Decision Making/ A&P                           Medical Decision Making Amount and/or Complexity of Data Reviewed Radiology: ordered.  Patient is a 51 year old male who presents to the emergency department due to right knee pain that began earlier today while mowing the lawn.  On my exam patient has moderate tenderness overlying the patella but no increased warmth or soft tissue swelling.  Afebrile, nontachycardic, and nontoxic-appearing.  Doubt septic joint at this time.  No laxity appreciated in the joint, but difficult to assess due to patient's pain.  Neurovascularly intact distal to the knee.  X-ray was obtained in triage which is negative.  Patient has been having ongoing pain and swelling in the knees for many months.  States that he has been seen by rheumatology for this.  Offered him a knee brace but he already has one at home.  He requests a referral to sports medicine.  This was given to him.  Patient appears stable for discharge at this time and he is agreeable.  Recommended continued use of Tylenol as well as Motrin.  RICE method.  Discussed return precautions.  His questions were answered and he was amicable at the time of discharge. Final Clinical Impression(s) / ED Diagnoses Final diagnoses:  Acute pain of right knee   Rx / DC Orders ED Discharge Orders     None         Placido Sou, PA-C 02/14/22 1827    Tegeler, Canary Brim, MD 02/14/22 2311

## 2022-02-14 NOTE — Discharge Instructions (Signed)
Like we discussed, please continue to use Tylenol as well as Motrin for management of your pain.  Please follow instructions on the bottles.  Please wear your knee brace as needed for comfort.  I would also recommend icing the knee 1-2 times per day.  Below is the contact information for Darren Scott.  He is a local sports medicine physician that is located in the same building as this emergency department.  Please give him a call and schedule an appointment for reevaluation.  If you develop any new or worsening symptoms please come back to the emergency department.

## 2022-02-14 NOTE — ED Notes (Signed)
Pt. Reports he was mowing today and felt is R knee pop to the outside.

## 2022-02-14 NOTE — ED Triage Notes (Signed)
Hurt right knee while was mowing the grass . Ambulated with a lot discomfort .

## 2022-02-14 NOTE — ED Notes (Signed)
Pt A&OX4 ambulatory at d/c with independent steady gait. Pt declined offer for wheelchair. Pt verbalized understanding of d/c instructions and follow up care. ?

## 2022-02-25 ENCOUNTER — Ambulatory Visit: Payer: Self-pay

## 2022-02-25 ENCOUNTER — Ambulatory Visit: Payer: BC Managed Care – PPO | Admitting: Family Medicine

## 2022-02-25 VITALS — BP 140/80 | Ht 68.0 in | Wt 174.0 lb

## 2022-02-25 DIAGNOSIS — S83242A Other tear of medial meniscus, current injury, left knee, initial encounter: Secondary | ICD-10-CM

## 2022-02-25 DIAGNOSIS — S83249A Other tear of medial meniscus, current injury, unspecified knee, initial encounter: Secondary | ICD-10-CM | POA: Insufficient documentation

## 2022-02-25 DIAGNOSIS — M958 Other specified acquired deformities of musculoskeletal system: Secondary | ICD-10-CM | POA: Insufficient documentation

## 2022-02-25 DIAGNOSIS — M25561 Pain in right knee: Secondary | ICD-10-CM

## 2022-02-25 HISTORY — DX: Other tear of medial meniscus, current injury, unspecified knee, initial encounter: S83.249A

## 2022-02-25 MED ORDER — CELECOXIB 200 MG PO CAPS
ORAL_CAPSULE | ORAL | 2 refills | Status: DC
Start: 2022-02-25 — End: 2022-07-03

## 2022-02-25 NOTE — Patient Instructions (Addendum)
Nice to meet you  Please use ice as needed. Please try Celebrex.  You can take this with Tylenol. We will get the MRI of the left and right knee completed at [facility] imaging Please send me a message in MyChart with any questions or updates.  We will call a setup virtual visit once the MRIs are right knee right knee Darren Scott events of this particular were negative he has mild effusion appreciated in the suprapatellar pouch.   I mild effusion of the suprapatellar pouch.  I think that mild effusion suprapatellar pouch effusion of the suprapatellar pouch no changes of the medial or lateral meniscus mild effusion appreciated in the suprapatellar pouch. Completed..   --Dr. Jordan Scott

## 2022-02-25 NOTE — Assessment & Plan Note (Signed)
Acutely occurring right knee pain.  Pain has been ongoing since last October.  He has tried medications and physical therapy.  He has been under greater than 6 weeks of physician directed home exercise therapy.  Having pain with weightbearing, knee flexion and having giving way of his knee.  Concerning for osteochondral defect of the femur or patella. -Counseled on home exercise therapy and supportive care. -MRI of the right knee to evaluate for osteochondral defect and for presurgical planning.

## 2022-02-25 NOTE — Progress Notes (Signed)
  Darren Scott - 51 y.o. male MRN [id number]  Date of birth: 1971-04-25  SUBJECTIVE:  Including CC & ROS.  No chief complaint on file.   Darren Scott is a 51 y.o. male that is  presenting with acute on chronic right and left knee. His right knee recently gave out while he was mowing his yard. He has pain with walking and weightbearing. His left knee is presenting with medial pain. He's had a prior surgery in his left knee and received steroid injections. He's try physical therapy from 10/04/21 to 11/01/21.  Review of the emergency department note from 5/25 shows he was counseled supportive care. Independent review of the right knee x-ray from 5/25 shows no acute changes.  Review of Systems See HPI   HISTORY: Past Medical, Surgical, Social, and Family History Reviewed & Updated per EMR.   Pertinent Historical Findings include:  Past Medical History:  Diagnosis Date   Acute bronchitis 08/16/2014   Arthritis    LEFT knee   Atypical chest pain 02/20/2018   Dyslipidemia 02/20/2018   Frequent headaches    uses PRN meds   GERD (gastroesophageal reflux disease)    on meds   History of hypertension 12/29/2013   Hyperglycemia 12/29/2013   Hyperlipidemia    Hypertension    on meds   Other and unspecified hyperlipidemia 12/29/2013   on meds   Routine general medical examination at a health care facility 12/29/2013   Seasonal allergies     Past Surgical History:  Procedure Laterality Date   KNEE SURGERY Left 2007   WISDOM TOOTH EXTRACTION       PHYSICAL EXAM:  VS: BP 140/80   Ht 5\' 8"  (1.727 m)   Wt 174 lb (78.9 kg)   BMI 26.46 kg/m  Physical Exam Gen: NAD, alert, cooperative with exam, well-appearing MSK:  Right and left knee:  Effusion noted  Limited flexion and extension  Positive grind test on right  Positive McMurray's test on left Neurovascularly intact    Limited ultrasound: Right knee:  Mild effusion in suprapatellar pouch  Normal appearing medial and  lateral meniscus   Summary: joint effusion   Ultrasound and interpretation by Darren Coots, MD    ASSESSMENT & PLAN:   Osteochondral defect of condyle of femur Acutely occurring right knee pain.  Pain has been ongoing since last October.  He has tried medications and physical therapy.  He has been under greater than 6 weeks of physician directed home exercise therapy.  Having pain with weightbearing, knee flexion and having giving way of his knee.  Concerning for osteochondral defect of the femur or patella. -Counseled on home exercise therapy and supportive care. -MRI of the right knee to evaluate for osteochondral defect and for presurgical planning.  Acute medial meniscal tear Acute on chronic in nature.  Having medial pain on exam.  Has been dealing with pain since October of last year.  Has a history of surgery as well as received previous steroid injections.  Has completed greater than 6 weeks of physician directed home exercise therapy as well as physical therapy. -Counseled on home exercise therapy and supportive care. -Celebrex. -MRI of the left knee to evaluate for meniscal tear as well as presurgical planning.

## 2022-02-25 NOTE — Assessment & Plan Note (Signed)
Acute on chronic in nature.  Having medial pain on exam.  Has been dealing with pain since October of last year.  Has a history of surgery as well as received previous steroid injections.  Has completed greater than 6 weeks of physician directed home exercise therapy as well as physical therapy. -Counseled on home exercise therapy and supportive care. -Celebrex. -MRI of the left knee to evaluate for meniscal tear as well as presurgical planning.

## 2022-03-07 ENCOUNTER — Ambulatory Visit: Payer: BC Managed Care – PPO | Admitting: Neurology

## 2022-03-09 ENCOUNTER — Ambulatory Visit
Admission: RE | Admit: 2022-03-09 | Discharge: 2022-03-09 | Disposition: A | Discharge status: Home / Self Care | Payer: BC Managed Care – PPO | Source: Ambulatory Visit | Attending: Family Medicine | Admitting: Family Medicine

## 2022-03-09 DIAGNOSIS — S83242A Other tear of medial meniscus, current injury, left knee, initial encounter: Secondary | ICD-10-CM

## 2022-03-09 DIAGNOSIS — M958 Other specified acquired deformities of musculoskeletal system: Secondary | ICD-10-CM

## 2022-03-09 IMAGING — MR MR KNEE*R* W/O CM
4 of 6 series · 24 of 40 positions shown · non-contrast
Comparison: None Available.

CLINICAL DATA: Bilateral knee pain. Pain and swelling with numbness
for 1 month.

EXAM:
MRI OF THE RIGHT KNEE WITHOUT CONTRAST
TECHNIQUE: Multiplanar, multisequence MR imaging of the knee was performed. No
intravenous contrast was administered.

[Series 3: T2 fat-sat · axial · 4.0mm · 0.66mm/px · z∈[-74,+60]mm · 7 of 28 slices shown (1 of 2)]
[im 1/28]
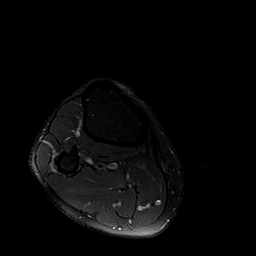
[im 5/28]
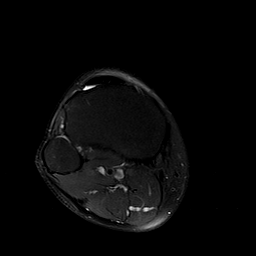
[im 10/28]
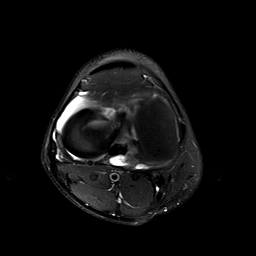
[im 14/28]
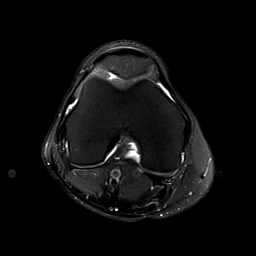
[im 19/28]
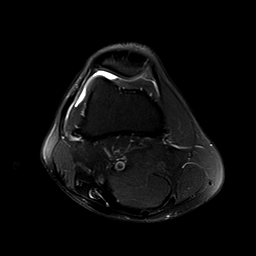
[im 23/28]
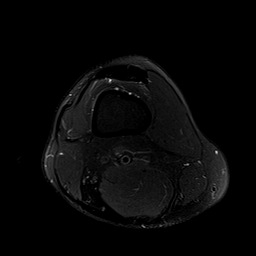
[im 28/28]
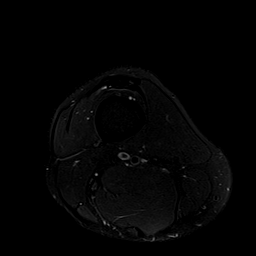

[Series 5: T2 fat-sat · coronal · 4.0mm · 0.31mm/px · 3 of 25 slices shown (2 of 2)]
[im 5/25]
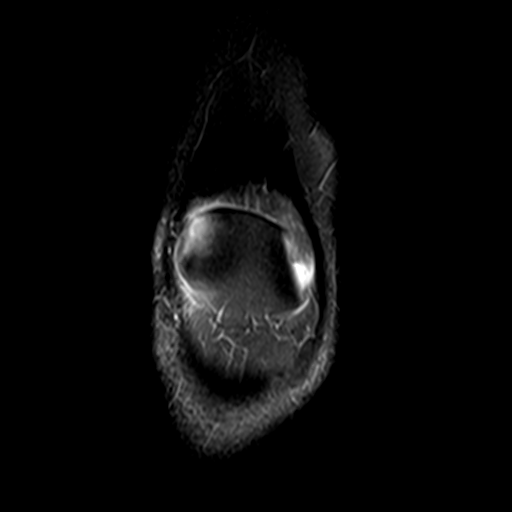
[im 15/25]
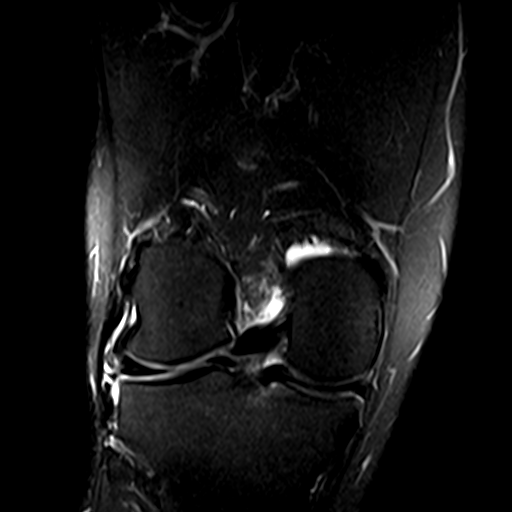
[im 25/25]
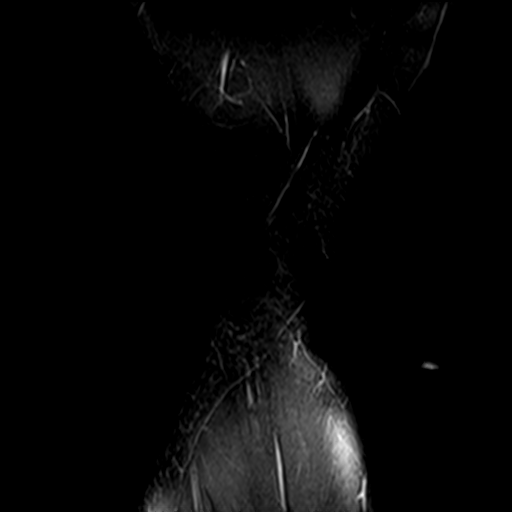

[Series 6: PD fat-sat · coronal · 3.0mm · 0.31mm/px · 7 of 29 slices shown (1 of 2)]
[im 1/29]
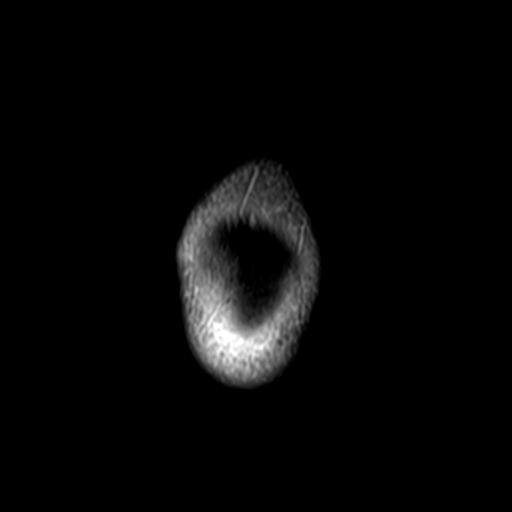
[im 5/29]
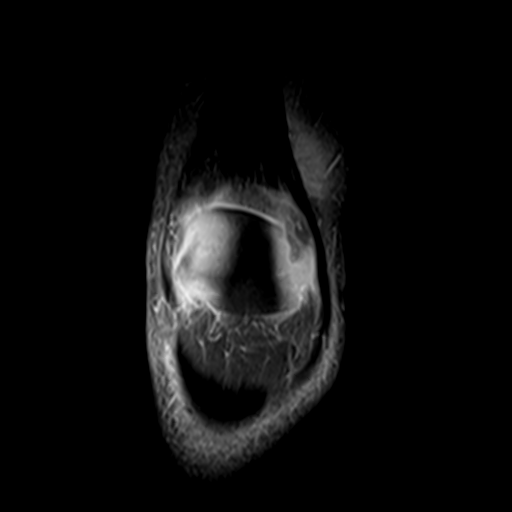
[im 10/29]
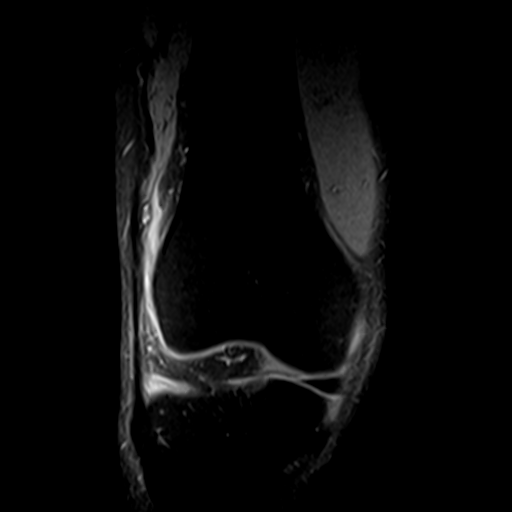
[im 15/29]
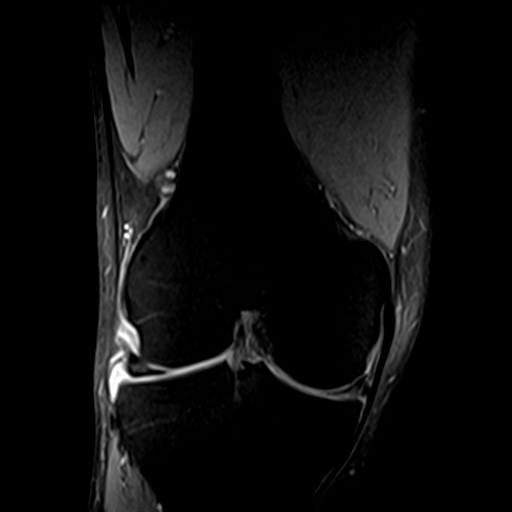
[im 19/29]
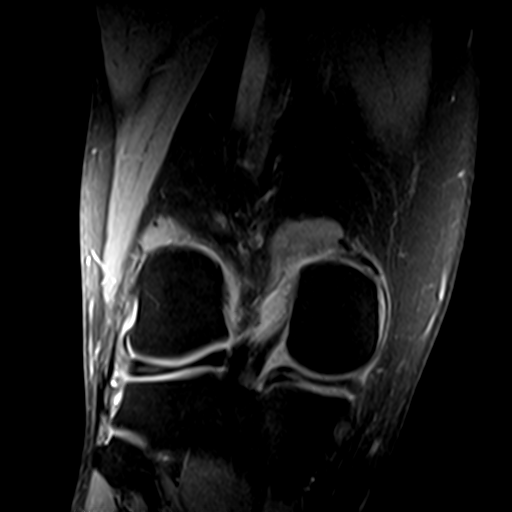
[im 24/29]
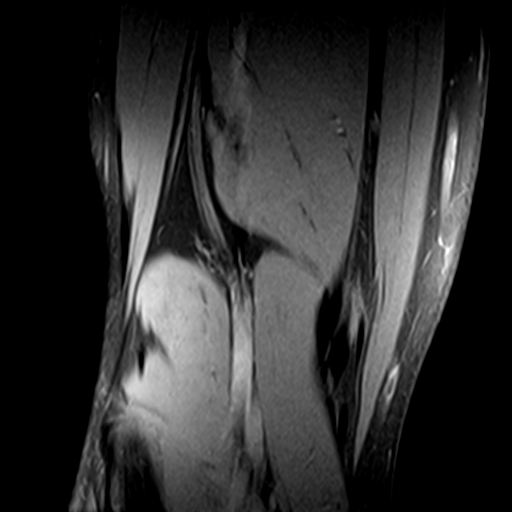
[im 29/29]
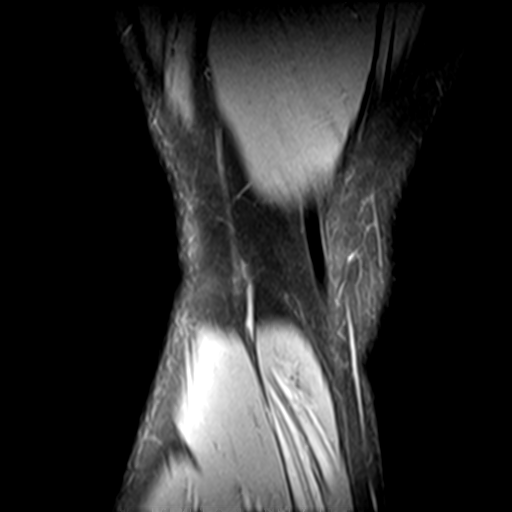

[Series 11: PD fat-sat · sagittal · 3.0mm · 0.31mm/px · 7 of 30 slices shown (2 of 2)]
[im 1/30]
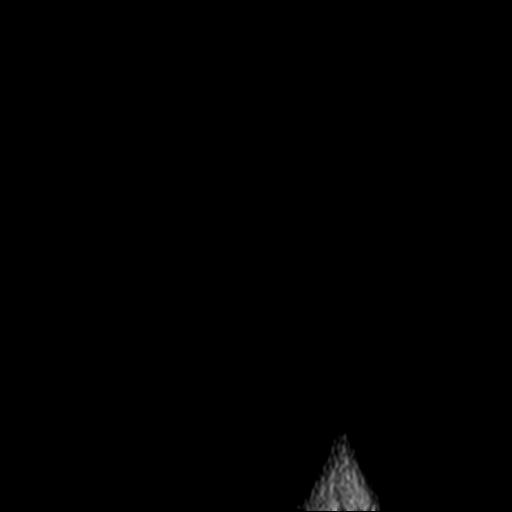
[im 5/30]
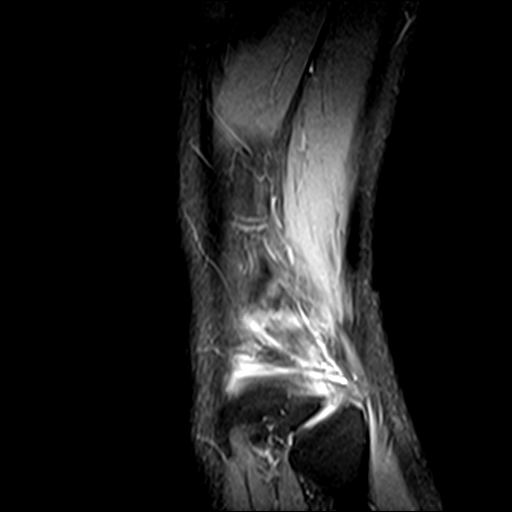
[im 10/30]
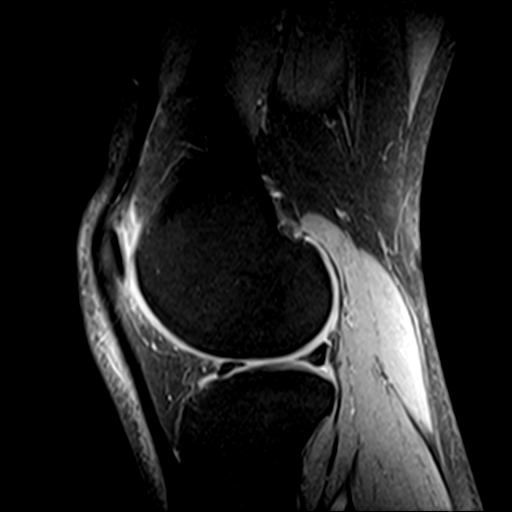
[im 15/30]
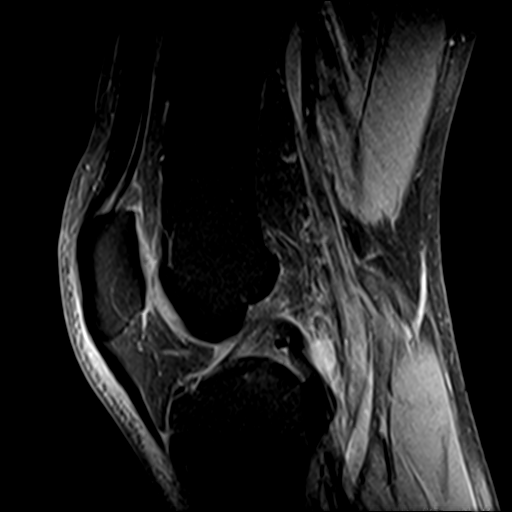
[im 20/30]
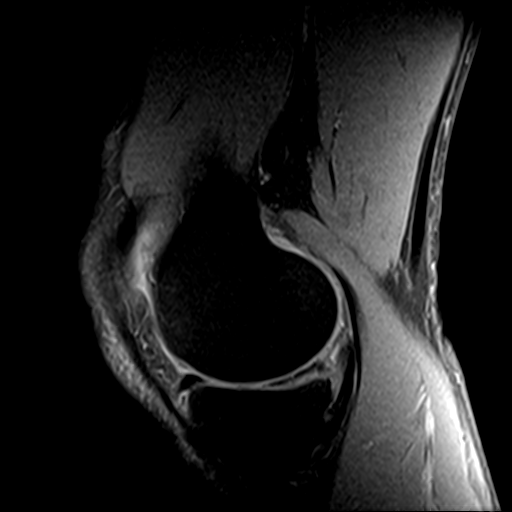
[im 25/30]
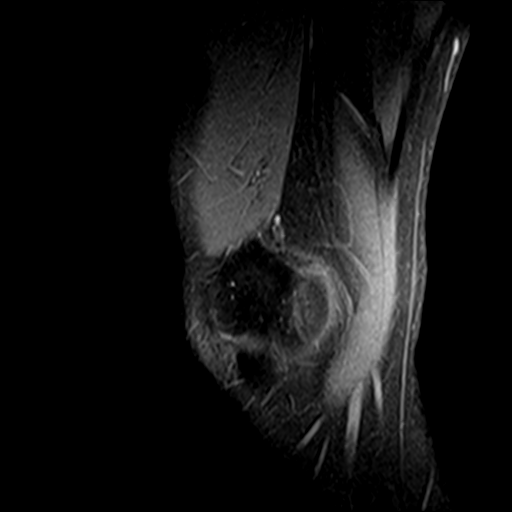
[im 30/30]
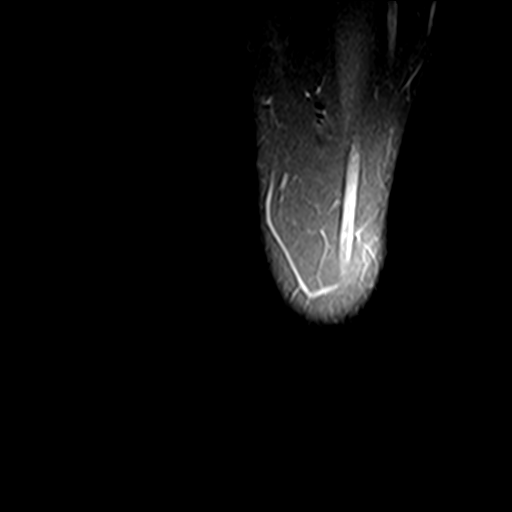

[24 of 40 positions shown; findings below may reference images not displayed]

FINDINGS: MENISCI

Medial: Radial tear of the posterior horn of the medial meniscus
towards the meniscal root. Oblique tear of the posterior horn-body
junction of the medial meniscus extending to the inferior articular
surface.

Lateral: No discrete meniscal tear. Degeneration of the body of the
lateral meniscus.

LIGAMENTS

Cruciates: ACL and PCL are intact.

Collaterals: Medial collateral ligament is intact. Lateral
collateral ligament complex is intact.

CARTILAGE

Patellofemoral:  No chondral defect.

Medial:  No chondral defect.

Lateral:  No chondral defect.

JOINT: No joint effusion. Normal SHAHRAM. No plical
thickening.

POPLITEAL FOSSA: Popliteus tendon is intact. No Baker's cyst.

EXTENSOR MECHANISM: Intact quadriceps tendon. Intact patellar
tendon. Intact lateral patellar retinaculum. Intact medial patellar
retinaculum. Intact MPFL.

BONES: No aggressive osseous lesion. No fracture or dislocation.

Other: No fluid collection or hematoma. Muscles are normal.
IMPRESSION: 1. Radial tear of the posterior horn of the medial meniscus towards
the meniscal root. Oblique tear of the posterior horn-body junction
of the medial meniscus extending to the inferior articular surface.

## 2022-03-09 IMAGING — MR MR KNEE*L* W/O CM
4 of 6 series · 21 of 40 positions shown · non-contrast
Comparison: None Available.

CLINICAL DATA: Bilateral knee pain, pain and swelling with numbness
for 1 month

EXAM:
MRI OF THE LEFT KNEE WITHOUT CONTRAST
TECHNIQUE: Multiplanar, multisequence MR imaging of the knee was performed. No
intravenous contrast was administered.

[Series 3: T2 fat-sat · axial · 4.0mm · 0.62mm/px · z∈[-47,+63]mm · 3 of 28 slices shown (1 of 2)]
[im 6/28]
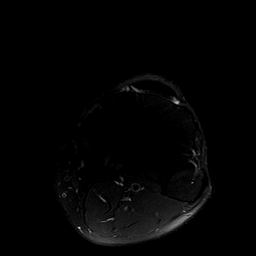
[im 17/28]
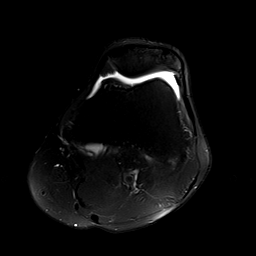
[im 28/28]
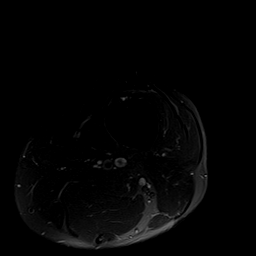

[Series 4: T2 fat-sat · coronal · 4.0mm · 0.29mm/px · 3 of 25 slices shown (2 of 2)]
[im 5/25]
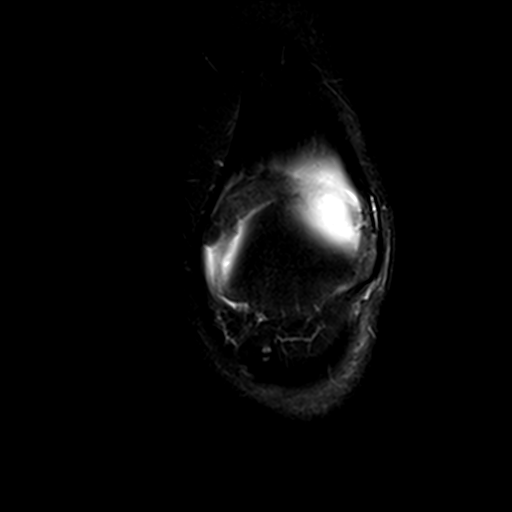
[im 15/25]
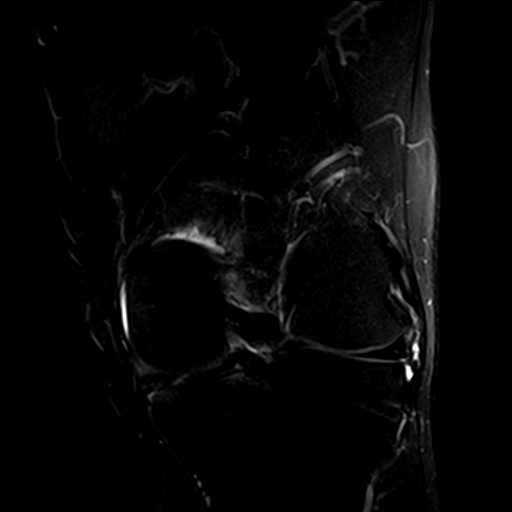
[im 25/25]
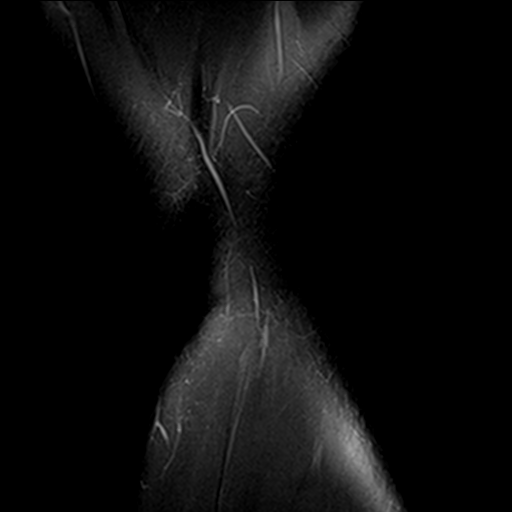

[Series 7: PD fat-sat · sagittal · 3.0mm · 0.29mm/px · 7 of 28 slices shown (1 of 2)]
[im 1/28]
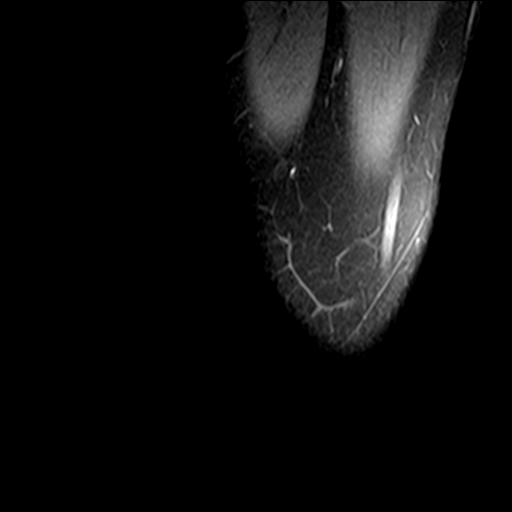
[im 5/28]
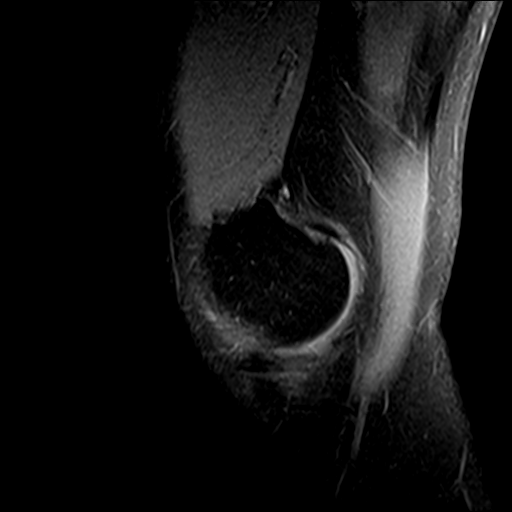
[im 10/28]
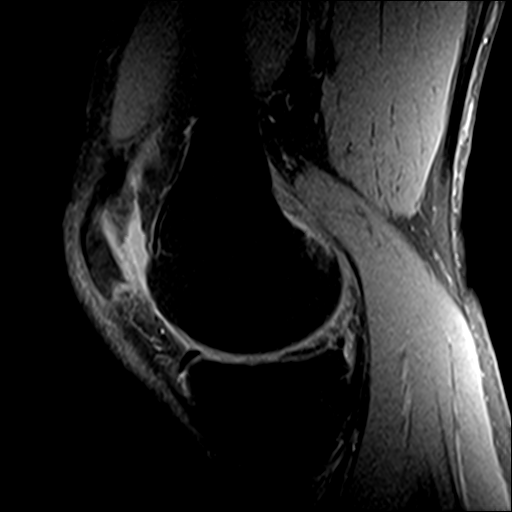
[im 14/28]
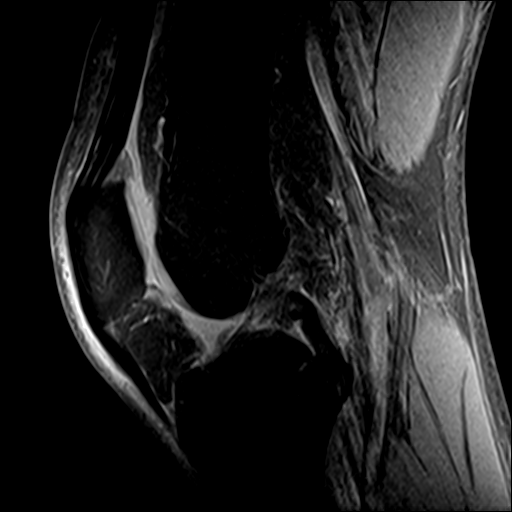
[im 19/28]
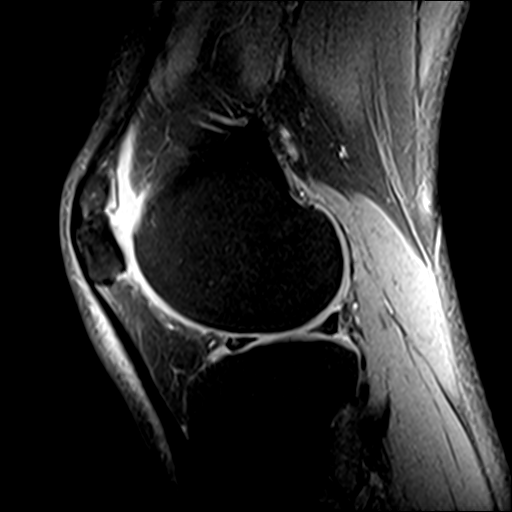
[im 23/28]
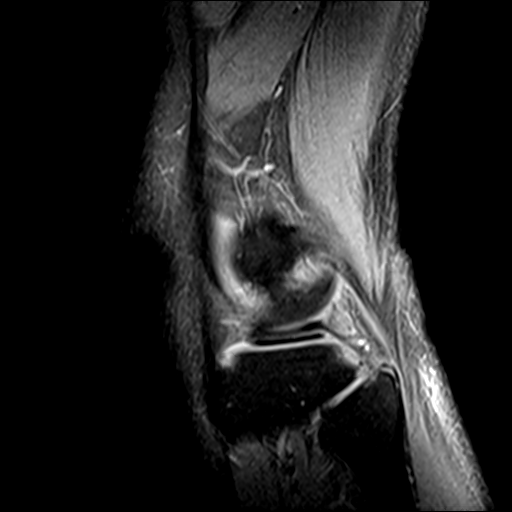
[im 28/28]
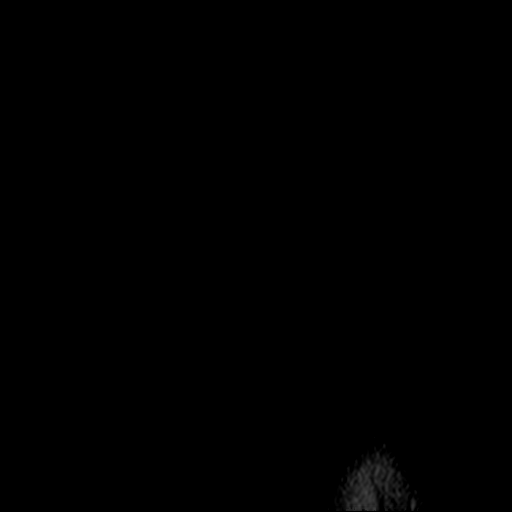

[Series 8: PD fat-sat · coronal · 3.0mm · 0.29mm/px · 8 of 32 slices shown (2 of 2)]
[im 1/32]
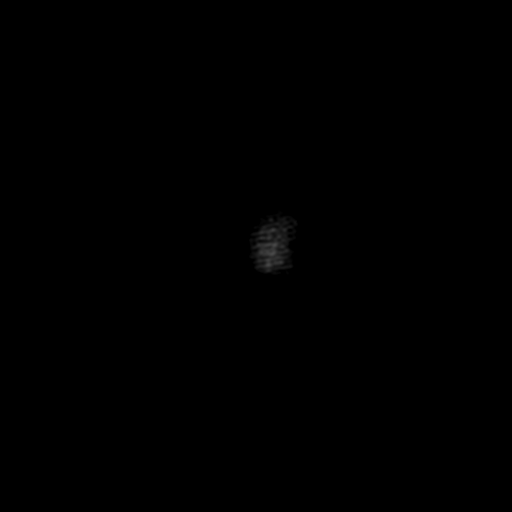
[im 5/32]
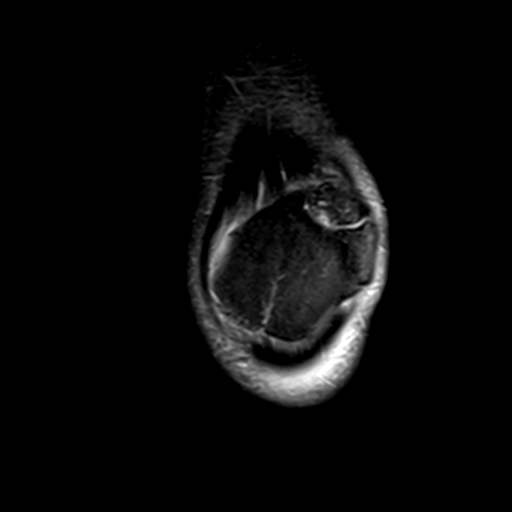
[im 9/32]
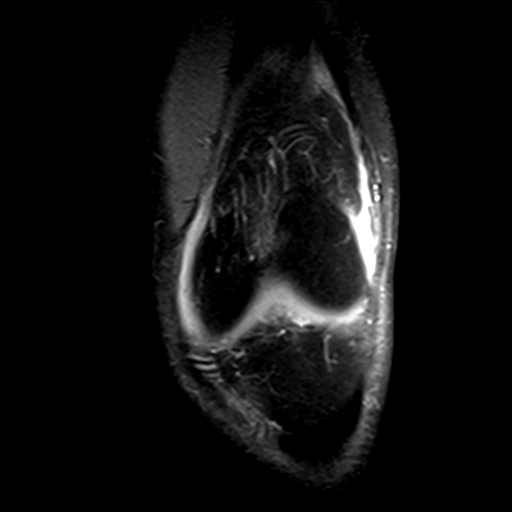
[im 14/32]
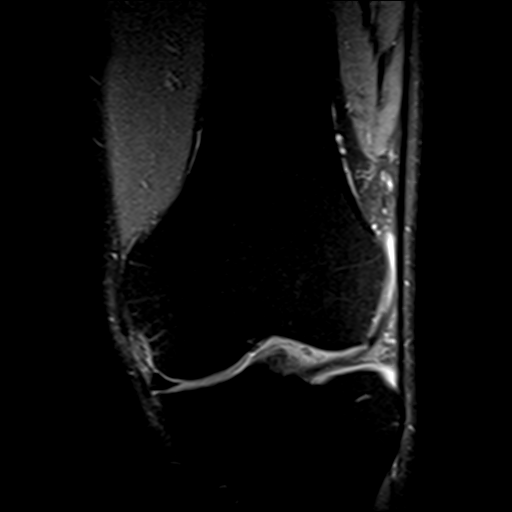
[im 18/32]
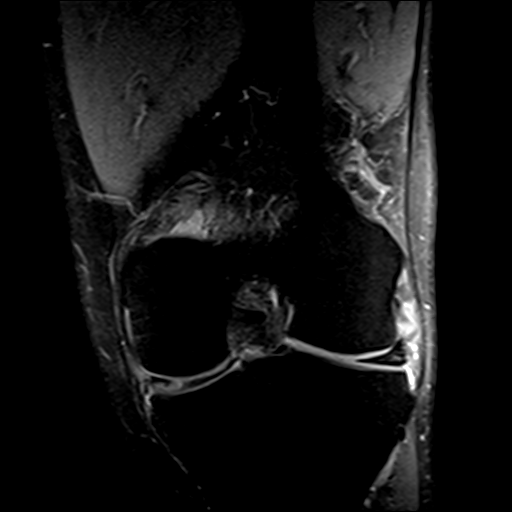
[im 23/32]
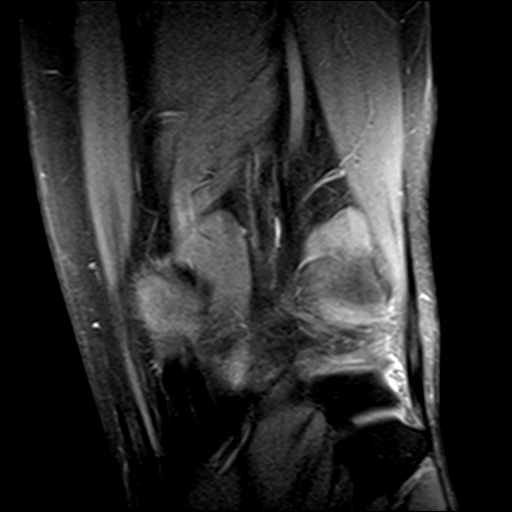
[im 27/32]
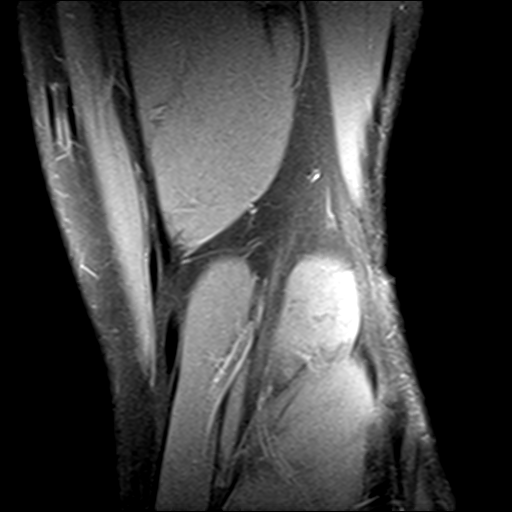
[im 32/32]
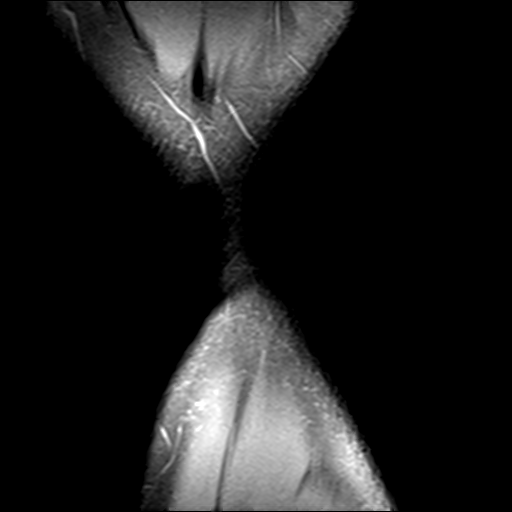

[21 of 40 positions shown; findings below may reference images not displayed]

FINDINGS: MENISCI

Medial: Extensive complex tear of the posterior horn of the medial
meniscus extending into the posterior body.

Lateral: Mild complex tear of the posterior horn-body junction of
the lateral meniscus.

LIGAMENTS

Cruciates: ACL and PCL are intact.

Collaterals: Medial collateral ligament is intact. Lateral
collateral ligament complex is intact.

CARTILAGE

Patellofemoral:  No chondral defect.

Medial: Mild partial-thickness cartilage loss of the medial
femorotibial compartment.

Lateral:  No chondral defect.

JOINT: Small joint effusion. Normal JALIL. No plical
thickening.

POPLITEAL FOSSA: Popliteus tendon is intact. No Baker's cyst.

EXTENSOR MECHANISM: Intact quadriceps tendon. Intact patellar
tendon. Intact lateral patellar retinaculum. Intact medial patellar
retinaculum. Intact MPFL.

BONES: No aggressive osseous lesion. No fracture or dislocation.
Bipartite superolateral patella with marrow edema in the
superolateral aspect of the patellar fragment as can be seen with
instability across the synchondrosis.

Other: No fluid collection or hematoma. Muscles are normal.
IMPRESSION: 1. Extensive complex tear of the posterior horn of the medial
meniscus extending into the posterior body.
2. Mild complex tear of the posterior horn-body junction of the
lateral meniscus.
3. Bipartite superolateral patella with marrow edema in the
superolateral aspect of the patellar fragment as can be seen with
instability across the synchondrosis.

## 2022-03-12 ENCOUNTER — Encounter: Payer: Self-pay | Admitting: Family Medicine

## 2022-03-12 ENCOUNTER — Telehealth (INDEPENDENT_AMBULATORY_CARE_PROVIDER_SITE_OTHER): Payer: BC Managed Care – PPO | Admitting: Family Medicine

## 2022-03-12 DIAGNOSIS — M1712 Unilateral primary osteoarthritis, left knee: Secondary | ICD-10-CM | POA: Diagnosis not present

## 2022-03-12 DIAGNOSIS — S83241D Other tear of medial meniscus, current injury, right knee, subsequent encounter: Secondary | ICD-10-CM

## 2022-03-12 NOTE — Progress Notes (Signed)
Virtual Visit via Video Note  I connected with Darren Scott on 03/12/22 at  8:00 AM EDT by a video enabled telemedicine application and verified that I am speaking with the correct person using two identifiers.  Location: Patient: home Provider: office   I discussed the limitations of evaluation and management by telemedicine and the availability of in person appointments. The patient expressed understanding and agreed to proceed.  History of Present Illness:  Darren Scott is a 51 year old male that is following up after the MRI of his left and right knee.  The left knee MRI was showing a complex tearing of the meniscus in the medial and lateral portion.  It also demonstrated a bipartite patella with marrow edema.  The MRI of the right knee was demonstrating a radial tear towards the meniscal root.  He reports continued limited range of motion in flexion.   Observations/Objective:   Assessment and Plan:  OA of left knee: Has a history of previous surgery and current MRI demonstrating degenerative changes with a bipartite patella. -Counseled on home exercise therapy and supportive care. -Pursue Zilretta. -Try reaction brace  Meniscal tear of right knee: MRI was demonstrating a meniscal tear that does seem to extend to the root.  He reports mechanical symptoms been ongoing since October. -Counseled on home exercise therapy and supportive care. -Referral to orthopedics.  Follow Up Instructions:    I discussed the assessment and treatment plan with the patient. The patient was provided an opportunity to ask questions and all were answered. The patient agreed with the plan and demonstrated an understanding of the instructions.   The patient was advised to call back or seek an in-person evaluation if the symptoms worsen or if the condition fails to improve as anticipated.    Darren Gandy, MD

## 2022-03-12 NOTE — Assessment & Plan Note (Signed)
MRI was demonstrating a meniscal tear that does seem to extend to the root.  He reports mechanical symptoms been ongoing since October. -Counseled on home exercise therapy and supportive care. -Referral to orthopedics.

## 2022-03-12 NOTE — Assessment & Plan Note (Signed)
Has a history of previous surgery and current MRI demonstrating degenerative changes with a bipartite patella. -Counseled on home exercise therapy and supportive care. -Pursue Zilretta. -Try reaction brace

## 2022-03-22 ENCOUNTER — Telehealth: Payer: Self-pay | Admitting: Family Medicine

## 2022-03-25 ENCOUNTER — Telehealth: Payer: Self-pay

## 2022-03-25 ENCOUNTER — Telehealth: Payer: Self-pay | Admitting: *Deleted

## 2022-03-25 NOTE — Telephone Encounter (Signed)
   Name: Darren Scott  DOB: 1971/03/01  MRN: [id number]  Primary Cardiologist: [name], DO   Preoperative team, please contact this patient and set up a phone call appointment for further preoperative risk assessment. Please obtain consent and complete medication review. Thank you for your help.  I confirm that guidance regarding antiplatelet and oral anticoagulation therapy has been completed and, if necessary, noted below.  Patient takes aspirin 81 mg daily, if necessary, patient may hold aspirin for 7 days prior to surgery.  Joylene Grapes, NP 03/25/2022, 3:02 PM [facility] [address] [place], [place] [address]

## 2022-03-25 NOTE — Telephone Encounter (Signed)
No prior authorization, medical notes or referrals needed. Patient has a [employer] plan with an effective date of 05/24/2021. Plan follows [employer] guidelines. Payer states that Dr. Lianne Bushy is In-Network under NPI [id number] and Tax ID [id number] however a different address is listed in the Payer's records. Provider needs to update address with payer prior to claims submission. Patient is responsible for a $150 copay for J3304 (Zilretta) with the remaining covered at 100% by the payer at contracted rate. Patient is responsible for a $150 copay for CPT code 20610 with the remaining covered at 100% by the payer at contracted rate. Deductibles do not apply to these services. Patient has a $150 copay whether or not an office visit is billed. Only one copay applies per date of service. Patient has an out of pocket maximum of $8550 and has accumulated $2774.63. If out of pocket is met, copays will no longer apply. Patient has a contract year policy starting from 09/32/6712 to 05/23/2022.

## 2022-03-25 NOTE — Telephone Encounter (Signed)
Pt agreeable to plan of care for tele pre op appt 04/01/22 @ 4 pm. Med rec and consent are done.     Patient Consent for Virtual Visit        Darren Scott has provided verbal consent on 03/25/2022 for a virtual visit (video or telephone).   CONSENT FOR VIRTUAL VISIT FOR:  Darren Scott  By participating in this virtual visit I agree to the following:  I hereby voluntarily request, consent and authorize [facility] and its employed or contracted physicians, physician assistants, nurse practitioners or other licensed health care professionals (the Practitioner), to provide me with telemedicine health care services (the "Services") as deemed necessary by the treating Practitioner. I acknowledge and consent to receive the Services by the Practitioner via telemedicine. I understand that the telemedicine visit will involve communicating with the Practitioner through live audiovisual communication technology and the disclosure of certain medical information by electronic transmission. I acknowledge that I have been given the opportunity to request an in-person assessment or other available alternative prior to the telemedicine visit and am voluntarily participating in the telemedicine visit.  I understand that I have the right to withhold or withdraw my consent to the use of telemedicine in the course of my care at any time, without affecting my right to future care or treatment, and that the Practitioner or I may terminate the telemedicine visit at any time. I understand that I have the right to inspect all information obtained and/or recorded in the course of the telemedicine visit and may receive copies of available information for a reasonable fee.  I understand that some of the potential risks of receiving the Services via telemedicine include:  Delay or interruption in medical evaluation due to technological equipment failure or disruption; Information transmitted may not be sufficient  (e.g. poor resolution of images) to allow for appropriate medical decision making by the Practitioner; and/or  In rare instances, security protocols could fail, causing a breach of personal health information.  Furthermore, I acknowledge that it is my responsibility to provide information about my medical history, conditions and care that is complete and accurate to the best of my ability. I acknowledge that Practitioner's advice, recommendations, and/or decision may be based on factors not within their control, such as incomplete or inaccurate data provided by me or distortions of diagnostic images or specimens that may result from electronic transmissions. I understand that the practice of medicine is not an exact science and that Practitioner makes no warranties or guarantees regarding treatment outcomes. I acknowledge that a copy of this consent can be made available to me via my patient portal ([facility] MyChart), or I can request a printed copy by calling the office of [facility].    I understand that my insurance will be billed for this visit.   I have read or had this consent read to me. I understand the contents of this consent, which adequately explains the benefits and risks of the Services being provided via telemedicine.  I have been provided ample opportunity to ask questions regarding this consent and the Services and have had my questions answered to my satisfaction. I give my informed consent for the services to be provided through the use of telemedicine in my medical care    

## 2022-03-25 NOTE — Telephone Encounter (Signed)
Pt agreeable to plan of care for tele pre op appt 04/01/22 @ 4 pm. Med rec and consent are done.     Patient Consent for Virtual Visit        Darren Scott has provided verbal consent on 03/25/2022 for a virtual visit (video or telephone).   CONSENT FOR VIRTUAL VISIT FOR:  Darren Scott  By participating in this virtual visit I agree to the following:  I hereby voluntarily request, consent and authorize [facility] and its employed or contracted physicians, physician assistants, nurse practitioners or other licensed health care professionals (the Practitioner), to provide me with telemedicine health care services (the "Services") as deemed necessary by the treating Practitioner. I acknowledge and consent to receive the Services by the Practitioner via telemedicine. I understand that the telemedicine visit will involve communicating with the Practitioner through live audiovisual communication technology and the disclosure of certain medical information by electronic transmission. I acknowledge that I have been given the opportunity to request an in-person assessment or other available alternative prior to the telemedicine visit and am voluntarily participating in the telemedicine visit.  I understand that I have the right to withhold or withdraw my consent to the use of telemedicine in the course of my care at any time, without affecting my right to future care or treatment, and that the Practitioner or I may terminate the telemedicine visit at any time. I understand that I have the right to inspect all information obtained and/or recorded in the course of the telemedicine visit and may receive copies of available information for a reasonable fee.  I understand that some of the potential risks of receiving the Services via telemedicine include:  Delay or interruption in medical evaluation due to technological equipment failure or disruption; Information transmitted may not be sufficient  (e.g. poor resolution of images) to allow for appropriate medical decision making by the Practitioner; and/or  In rare instances, security protocols could fail, causing a breach of personal health information.  Furthermore, I acknowledge that it is my responsibility to provide information about my medical history, conditions and care that is complete and accurate to the best of my ability. I acknowledge that Practitioner's advice, recommendations, and/or decision may be based on factors not within their control, such as incomplete or inaccurate data provided by me or distortions of diagnostic images or specimens that may result from electronic transmissions. I understand that the practice of medicine is not an exact science and that Practitioner makes no warranties or guarantees regarding treatment outcomes. I acknowledge that a copy of this consent can be made available to me via my patient portal [facility] MyChart), or I can request a printed copy by calling the office of [facility].    I understand that my insurance will be billed for this visit.   I have read or had this consent read to me. I understand the contents of this consent, which adequately explains the benefits and risks of the Services being provided via telemedicine.  I have been provided ample opportunity to ask questions regarding this consent and the Services and have had my questions answered to my satisfaction. I give my informed consent for the services to be provided through the use of telemedicine in my medical care

## 2022-03-25 NOTE — Telephone Encounter (Signed)
    Pre-operative Risk Assessment    Patient Name: Darren Scott  DOB: 02/20/1971 MRN: [id number]      Request for Surgical Clearance    Procedure:   Right Knee Arthroplasty  Date of Surgery:  Clearance 04/25/22                                 Surgeon:  Marcene Corning, MD Surgeon's Group or Practice Name:  [facility] Phone number:  [phone] Fax number:  [phone]   Type of Clearance Requested:   - Medical    Type of Anesthesia:   Choice   Additional requests/questions:  Please advise surgeon/provider what medications should be held.  Signed, Irena Cords Gelene Recktenwald   03/25/2022, 2:43 PM

## 2022-03-27 ENCOUNTER — Other Ambulatory Visit: Payer: Self-pay | Admitting: Cardiology

## 2022-03-27 NOTE — Telephone Encounter (Signed)
Pt informed of below.  He states he is having bilateral arthroscopy on 04/25/22. Will hold off on Zilretta for now.  He will follow up with ortho as schedule and call us as needed.

## 2022-04-01 ENCOUNTER — Ambulatory Visit (INDEPENDENT_AMBULATORY_CARE_PROVIDER_SITE_OTHER): Payer: BC Managed Care – PPO | Admitting: Nurse Practitioner

## 2022-04-01 ENCOUNTER — Encounter: Payer: Self-pay | Admitting: Nurse Practitioner

## 2022-04-01 DIAGNOSIS — Z01810 Encounter for preprocedural cardiovascular examination: Secondary | ICD-10-CM

## 2022-04-01 NOTE — Progress Notes (Signed)
Virtual Visit via Telephone Note   Because of [employer] co-morbid illnesses, he is at least at moderate risk for complications without adequate follow up.  This format is felt to be most appropriate for this patient at this time.  The patient did not have access to video technology/had technical difficulties with video requiring transitioning to audio format only (telephone).  All issues noted in this document were discussed and addressed.  No physical exam could be performed with this format.  Please refer to the patient's chart for his consent to telehealth for [facility].  Evaluation Performed:  Preoperative cardiovascular risk assessment _____________   Date:  04/01/2022   Patient ID:  Darren Scott, DOB Feb 25, 1971, MRN [id number] Patient Location:  Home Provider location:   Office  Primary Care Provider:  [name], NP Primary Cardiologist:  [name], DO  Chief Complaint / Patient Profile   51 y.o. y/o male with a h/o hypertension, hyperlipidemia, CAD diagnosed by [facility] who is pending right knee arthroplasty and presents today for telephonic preoperative cardiovascular risk assessment.  Past Medical History    Past Medical History:  Diagnosis Date   Acute bronchitis 08/16/2014   Arthritis    LEFT knee   Atypical chest pain 02/20/2018   Dyslipidemia 02/20/2018   Frequent headaches    uses PRN meds   GERD (gastroesophageal reflux disease)    on meds   History of hypertension 12/29/2013   Hyperglycemia 12/29/2013   Hyperlipidemia    Hypertension    on meds   Other and unspecified hyperlipidemia 12/29/2013   on meds   Routine general medical examination at a health care facility 12/29/2013   Seasonal allergies    Past Surgical History:  Procedure Laterality Date   KNEE SURGERY Left 2007   WISDOM TOOTH EXTRACTION      Allergies  No Known Allergies  History of Present Illness    Darren Scott is a 51 y.o. male who presents via  audio/video conferencing for a telehealth visit today.  Pt was last seen in cardiology clinic on 01/01/22 by Dr. Servando Salina.  At that time Darren Scott was doing well.  The patient is now pending procedure as outlined above. Since his last visit, he  denies chest pain, shortness of breath, lower extremity edema, fatigue, palpitations, melena, hematuria, hemoptysis, diaphoresis, weakness, presyncope, syncope, orthopnea, and PND.  Home Medications    Prior to Admission medications   Medication Sig Start Date End Date Taking? Authorizing Provider  amLODipine (NORVASC) 10 MG tablet TAKE 1 TABLET BY MOUTH EVERY DAY 12/28/21   Darren Craze, NP  aspirin EC 81 MG tablet Take 1 tablet (81 mg total) by mouth daily. 11/24/19   Tobb, Kardie, DO  celecoxib (CELEBREX) 200 MG capsule One to 2 tablets by mouth daily as needed for pain. 02/25/22   Myra Rude, MD  esomeprazole (NEXIUM) 20 MG capsule Take 20 mg by mouth daily at 12 noon.    [provider]  metoprolol succinate (TOPROL-XL) 50 MG 24 hr tablet TAKE 1 TABLET BY MOUTH DAILY. TAKE WITH OR IMMEDIATELY FOLLOWING A MEAL. 12/28/21   Darren Craze, NP  nortriptyline (PAMELOR) 10 MG capsule Take 1 capsule (10 mg total) by mouth at bedtime. Patient taking differently: Take 10 mg by mouth daily as needed. 08/01/21   Everlena Cooper, Adam R, DO  rosuvastatin (CRESTOR) 20 MG tablet TAKE 1 TABLET BY MOUTH EVERY DAY 03/28/22   [name], DO    Physical Exam  Vital Signs:  Neco Kling does not have vital signs available for review today.  Given telephonic nature of communication, physical exam is limited. AAOx3. NAD. Normal affect.  Speech and respirations are unlabored.  Accessory Clinical Findings    None  Assessment & Plan    1.  Preoperative Cardiovascular Risk Assessment: Patient is doing well from a cardiac perspective and may proceed to surgery without further testing. According to the Revised Cardiac Risk Index (RCRI), his  Perioperative Risk of Major Cardiac Event is (%): 0.4.  His Functional Capacity in METs is: 7.59 according to the Duke Activity Status Index (DASI). He may hold aspirin for 5-7 days prior to surgery and should resume as soon as hemodynamically stable.    A copy of this note will be routed to requesting surgeon.  Time:   Today, I have spent 10 minutes with the patient with telehealth technology discussing medical history, symptoms, and management plan.     Levi Aland, NP-C    04/01/2022, 3:58 PM [facility] [address] Office [phone] Fax [phone]

## 2022-04-03 ENCOUNTER — Ambulatory Visit: Payer: BC Managed Care – PPO | Admitting: Family

## 2022-04-03 ENCOUNTER — Encounter: Payer: Self-pay | Admitting: Family

## 2022-04-03 ENCOUNTER — Ambulatory Visit (HOSPITAL_BASED_OUTPATIENT_CLINIC_OR_DEPARTMENT_OTHER)
Admission: RE | Admit: 2022-04-03 | Discharge: 2022-04-03 | Disposition: A | Discharge status: Home / Self Care | Payer: BC Managed Care – PPO | Source: Ambulatory Visit | Attending: Family | Admitting: Family

## 2022-04-03 VITALS — BP 121/90 | HR 74 | Temp 98.2°F | Resp 16 | Ht 68.0 in | Wt 171.0 lb

## 2022-04-03 DIAGNOSIS — Z01818 Encounter for other preprocedural examination: Secondary | ICD-10-CM | POA: Insufficient documentation

## 2022-04-03 DIAGNOSIS — Z8679 Personal history of other diseases of the circulatory system: Secondary | ICD-10-CM

## 2022-04-03 LAB — COMPREHENSIVE METABOLIC PANEL
ALT: 47 U/L (ref 0–53)
AST: 31 U/L (ref 0–37)
Albumin: 4.8 g/dL (ref 3.5–5.2)
Alkaline Phosphatase: 71 U/L (ref 39–117)
BUN: 16 mg/dL (ref 6–23)
CO2: 30 mEq/L (ref 19–32)
Calcium: 10.1 mg/dL (ref 8.4–10.5)
Chloride: 104 mEq/L (ref 96–112)
Creatinine, Ser: 1.18 mg/dL (ref 0.40–1.50)
GFR: 71.52 mL/min (ref >60.00)
Glucose, Bld: 105 mg/dL — ABNORMAL HIGH (ref 70–99)
Potassium: 4.7 mEq/L (ref 3.5–5.1)
Sodium: 140 mEq/L (ref 135–145)
Total Bilirubin: 0.5 mg/dL (ref 0.2–1.2)
Total Protein: 7.2 g/dL (ref 6.0–8.3)

## 2022-04-03 LAB — CBC WITH DIFFERENTIAL/PLATELET
Basophils Absolute: 0 10*3/uL (ref 0.0–0.1)
Basophils Relative: 0.6 % (ref 0.0–3.0)
Eosinophils Absolute: 0.2 10*3/uL (ref 0.0–0.7)
Eosinophils Relative: 2.9 % (ref 0.0–5.0)
HCT: 44.2 % (ref 39.0–52.0)
Hemoglobin: 15 g/dL (ref 13.0–17.0)
Lymphocytes Relative: 33.7 % (ref 12.0–46.0)
Lymphs Abs: 2.6 10*3/uL (ref 0.7–4.0)
MCHC: 33.9 g/dL (ref 30.0–36.0)
MCV: 90.9 fl (ref 78.0–100.0)
Monocytes Absolute: 0.7 10*3/uL (ref 0.1–1.0)
Monocytes Relative: 9 % (ref 3.0–12.0)
Neutro Abs: 4.2 10*3/uL (ref 1.4–7.7)
Neutrophils Relative %: 53.8 % (ref 43.0–77.0)
Platelets: 223 10*3/uL (ref 150.0–400.0)
RBC: 4.86 Mil/uL (ref 4.22–5.81)
RDW: 13.3 % (ref 11.5–15.5)
WBC: 7.7 10*3/uL (ref 4.0–10.5)

## 2022-04-03 LAB — PROTIME-INR
INR: 0.9 ratio (ref 0.8–1.0)
Prothrombin Time: 9.9 s (ref 9.6–13.1)

## 2022-04-03 NOTE — Assessment & Plan Note (Signed)
Patient has hx of CAD and has already seen cardiology and been cleared from a cardiac standpoint. Normal exam today. Plan to check labs/cxr as below. Further recommendations following review of these studies.

## 2022-04-03 NOTE — Patient Instructions (Signed)
Please complete lab work prior to leaving. Complete chest x-ray prior to leaving.  

## 2022-04-03 NOTE — Progress Notes (Signed)
Subjective:   By signing my name below, I, Kellie Simmering, attest that this documentation has been prepared under the direction and in the presence of Debbrah Alar, NP 04/03/2022    Patient ID: Darren Scott, male    DOB: 03-Aug-1971, 51 y.o.   MRN: [id number]  Chief Complaint  Patient presents with   Pre-op Exam    Here for exam prior to bilateral knee surgery    HPI Patient is in today for a pre-operative clearance visit.  He is accompanied his son.  Arthroscopic surgery- He reports that he has an upcoming bilateral arthroscopic surgery scheduled to manage his knee pain. When he's standing or exerting force on his knees, pain is elevated. He reports that pain is worse on his right knee than his left.   He denies having any fever, new moles, congestion, sinus pain, sore throat, chest pain, palpitations, cough, shortness of breath, wheezing, nausea, vomiting, diarrhea, constipation, dysuria, frequency, abdominal pain, hematuria, new muscle pain, new joint pain, headache, depression or anxiety.   Past Medical History:  Diagnosis Date   Acute bronchitis 08/16/2014   Arthritis    LEFT knee   Atypical chest pain 02/20/2018   Dyslipidemia 02/20/2018   Frequent headaches    uses PRN meds   GERD (gastroesophageal reflux disease)    on meds   History of hypertension 12/29/2013   Hyperglycemia 12/29/2013   Hyperlipidemia    Hypertension    on meds   Other and unspecified hyperlipidemia 12/29/2013   on meds   Routine general medical examination at a health care facility 12/29/2013   Seasonal allergies     Past Surgical History:  Procedure Laterality Date   KNEE SURGERY Left 2007   WISDOM TOOTH EXTRACTION      Family History  Problem Relation Age of Onset   Heart attack Father        died in his 72's, smoker   Cancer Neg Hx    Diabetes Neg Hx    Colon cancer Neg Hx    Colon polyps Neg Hx    Esophageal cancer Neg Hx    Rectal cancer Neg Hx    Stomach cancer  Neg Hx     Social History   Socioeconomic History   Marital status: Single    Spouse name: Not on file   Number of children: Not on file   Years of education: Not on file   Highest education level: Not on file  Occupational History   Not on file  Tobacco Use   Smoking status: Never   Smokeless tobacco: Current    Types: Chew  Vaping Use   Vaping Use: Never used  Substance and Sexual Activity   Alcohol use: Yes    Alcohol/week: 18.0 standard drinks of alcohol    Types: 18 Cans of beer per week   Drug use: No   Sexual activity: Not on file  Other Topics Concern   Not on file  Social History Narrative   3 children 55 yr old son, 6 yr old daughter, son age 3 Separated, has joint custody.  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 90 tablet 3   celecoxib (CELEBREX) 200 MG capsule One to 2 tablets by mouth daily as needed for pain. 60 capsule 2   esomeprazole (NEXIUM) 20 MG capsule Take 20 mg by mouth daily at 12 noon.     metoprolol succinate (TOPROL-XL) 50 MG 24 hr tablet TAKE 1 TABLET BY MOUTH DAILY. TAKE WITH OR IMMEDIATELY FOLLOWING A MEAL. 90 tablet 1   nortriptyline (PAMELOR) 10 MG capsule Take 1 capsule (10 mg total) by mouth at bedtime. (Patient taking differently: Take 10 mg by mouth daily as needed.) 30 capsule 5   rosuvastatin (CRESTOR) 20 MG tablet TAKE 1  TABLET BY MOUTH EVERY DAY 30 tablet 11   No facility-administered medications prior to visit.    No Known Allergies  Review of Systems  Constitutional:  Negative for chills and fever.  HENT:  Negative for congestion, sinus pain and sore throat.   Respiratory:  Negative for cough, shortness of breath and wheezing.   Cardiovascular:  Negative for chest pain.  Gastrointestinal:  Negative for abdominal pain, constipation, diarrhea, nausea and vomiting.  Genitourinary:  Negative for dysuria, frequency, hematuria and urgency.  Musculoskeletal:  Negative for joint pain and myalgias.  Skin:  Negative for rash.       (-) new moles  Neurological:  Negative for headaches.  Psychiatric/Behavioral:  Negative for depression. The patient is not nervous/anxious.        Objective:    Physical Exam Constitutional:      Appearance: Normal appearance.  HENT:     Head: Normocephalic and atraumatic.     Right Ear: Tympanic membrane, ear canal and external ear normal.     Left Ear: Tympanic membrane, ear canal and external ear normal.     Mouth/Throat:     Tonsils: No tonsillar exudate.  Eyes:     Extraocular Movements: Extraocular movements intact.     Pupils: Pupils are equal, round, and reactive to light.  Cardiovascular:     Rate and Rhythm: Normal rate and regular rhythm.     Pulses: Normal pulses.     Heart sounds: Normal heart sounds. No murmur heard.    No gallop.  Pulmonary:     Effort: Pulmonary effort is normal. No respiratory distress.     Breath sounds: Normal breath sounds. No wheezing or rales.  Abdominal:     General: Bowel sounds are normal. There is no distension.     Palpations: Abdomen is soft.     Tenderness: There is no abdominal tenderness. There is no guarding.  Skin:    General: Skin is warm and dry.  Neurological:     Mental Status: He is alert and oriented to person, place, and time.  Psychiatric:        Mood and Affect: Mood normal.        Behavior: Behavior  normal.        Judgment: Judgment normal.     BP 121/90 (BP Location: Right Arm, Patient Position: Sitting, Cuff Size: Small)   Pulse 74   Temp 98.2 F (36.8 C) (Oral)   Resp 16   Ht _0  (1.727 m)   Wt 171 lb (  77.6 kg)   SpO2 100%   BMI 26.00 kg/m  Wt Readings from Last 3 Encounters:  04/03/22 171 lb (77.6 kg)  03/12/22 174 lb (78.9 kg)  02/25/22 174 lb (78.9 kg)     Assessment & Plan:   Problem List Items Addressed This Visit       Unprioritized   Pre-op evaluation    Patient has hx of CAD and has already seen cardiology and been cleared from a cardiac standpoint. Normal exam today. Plan to check labs/cxr as below. Further recommendations following review of these studies.       Relevant Orders   CBC with Differential/Platelet   DG Chest 2 View   Protime-INR   History of hypertension - Primary   Relevant Orders   Comp Met (CMET)   BP Readings from Last 3 Encounters:  04/03/22 121/90  02/25/22 140/80  02/14/22 120/89   BP acceptable. Continue toprol xl and amlodipine 69m.     No orders of the defined types were placed in this encounter.   I, MNance Pear NP, personally preformed the services described in this documentation.  All medical record entries made by the scribe were at my direction and in my presence.  I have reviewed the chart and discharge instructions (if applicable) and agree that the record reflects my personal performance and is accurate and complete.04/03/2022.  I,Mohammed Iqbal,acting as a sEducation administratorfor [employer] NP.,have documented all relevant documentation on the behalf of MNance Pear NP,as directed by  MNance Pear NP while in the presence of MNance Pear NP.  MNance Pear NP

## 2022-04-05 ENCOUNTER — Telehealth: Payer: Self-pay

## 2022-04-05 NOTE — Telephone Encounter (Signed)
error 

## 2022-04-25 HISTORY — PX: KNEE ARTHROSCOPY: SUR90

## 2022-07-03 ENCOUNTER — Ambulatory Visit: Payer: BC Managed Care – PPO | Admitting: Family

## 2022-07-03 ENCOUNTER — Encounter: Payer: Self-pay | Admitting: Family

## 2022-07-03 VITALS — BP 138/96 | HR 90 | Temp 98.1°F | Resp 16 | Wt 176.0 lb

## 2022-07-03 DIAGNOSIS — Z8679 Personal history of other diseases of the circulatory system: Secondary | ICD-10-CM | POA: Diagnosis not present

## 2022-07-03 DIAGNOSIS — E785 Hyperlipidemia, unspecified: Secondary | ICD-10-CM

## 2022-07-03 DIAGNOSIS — K219 Gastro-esophageal reflux disease without esophagitis: Secondary | ICD-10-CM

## 2022-07-03 NOTE — Assessment & Plan Note (Signed)
Trigs were elevated last time. Discussed limiting sugars in his diet.

## 2022-07-03 NOTE — Assessment & Plan Note (Signed)
Stable on nexium 20mg  once daily. Continue same.

## 2022-07-03 NOTE — Patient Instructions (Signed)
Please check blood pressure once daily for 3 days and send me your readings via mychart.

## 2022-07-03 NOTE — Progress Notes (Signed)
Subjective:     Patient ID: Darren Scott, male    DOB: 1971-06-18, 51 y.o.   MRN: [id number]  Chief Complaint  Patient presents with   Hypertension    Here for follow up    Hypertension   Patient is in today for follow up.   Bilateral Knee Arthroscopy- had surgery in August.  Reports that he still has pain and swelling sometimes.  HTN- reports good compliance with metoprolol and amlodipine.  BP Readings from Last 3 Encounters:  07/03/22 (!) 138/96  04/03/22 121/90  02/25/22 140/80   Hyperlipidemia- maintained on crestor.   GERD- reports that it is well controlled on nexium.  Notices if he does not take it.  Lab Results  Component Value Date   CHOL 167 01/01/2022   HDL 38 (L) 01/01/2022   LDLCALC 93 01/01/2022   LDLDIRECT 124.0 08/10/2019   TRIG 212 (H) 01/01/2022   CHOLHDL 4.4 01/01/2022     Health Maintenance Due  Topic Date Due   COVID-19 Vaccine (1) Never done    Past Medical History:  Diagnosis Date   Acute bronchitis 08/16/2014   Acute medial meniscal tear 02/25/2022   Arthritis    LEFT knee   Atypical chest pain 02/20/2018   Dyslipidemia 02/20/2018   Frequent headaches    uses PRN meds   GERD (gastroesophageal reflux disease)    on meds   History of hypertension 12/29/2013   Hyperglycemia 12/29/2013   Hyperlipidemia    Hypertension    on meds   Other and unspecified hyperlipidemia 12/29/2013   on meds   Routine general medical examination at a health care facility 12/29/2013   Seasonal allergies     Past Surgical History:  Procedure Laterality Date   KNEE ARTHROSCOPY Bilateral 04/25/2022   KNEE SURGERY Left 2007   WISDOM TOOTH EXTRACTION      Family History  Problem Relation Age of Onset   Heart attack Father        died in his 73's, smoker   Cancer Neg Hx    Diabetes Neg Hx    Colon cancer Neg Hx    Colon polyps Neg Hx    Esophageal cancer Neg Hx    Rectal cancer Neg Hx    Stomach cancer Neg Hx     Social History    Socioeconomic History   Marital status: Single    Spouse name: Not on file   Number of children: Not on file   Years of education: Not on file   Highest education level: Not on file  Occupational History   Not on file  Tobacco Use   Smoking status: Never   Smokeless tobacco: Current    Types: Chew  Vaping Use   Vaping Use: Never used  Substance and Sexual Activity   Alcohol use: Yes    Alcohol/week: 18.0 standard drinks of alcohol    Types: 18 Cans of beer per week   Drug use: No   Sexual activity: Not on file  Other Topics Concern   Not on file  Social History Narrative   3 children 3 yr old son, 54 yr old daughter, son age 2    Separated, has joint custody.  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 90 tablet 3   esomeprazole (NEXIUM) 20 MG capsule Take 20 mg by mouth daily at 12 noon.     metoprolol succinate (TOPROL-XL) 50 MG 24 hr tablet TAKE 1 TABLET BY MOUTH DAILY. TAKE WITH OR IMMEDIATELY FOLLOWING A MEAL. 90 tablet 1   rosuvastatin (CRESTOR) 20 MG tablet TAKE 1 TABLET BY MOUTH EVERY DAY 30 tablet 11   celecoxib (CELEBREX) 200 MG capsule One to 2 tablets by mouth daily as needed for pain. 60 capsule 2   nortriptyline (PAMELOR) 10 MG capsule Take 1 capsule (10 mg total) by mouth at bedtime. (Patient taking differently: Take 10 mg by mouth daily as  needed.) 30 capsule 5   No facility-administered medications prior to visit.    No Known Allergies  ROS    See HPI Objective:    Physical Exam Constitutional:      General: He is not in acute distress.    Appearance: He is well-developed.  HENT:     Head: Normocephalic and atraumatic.  Cardiovascular:     Rate and Rhythm: Normal rate and regular rhythm.     Heart sounds: No murmur heard. Pulmonary:     Effort: Pulmonary effort is normal. No respiratory distress.     Breath sounds: Normal breath sounds. No wheezing or rales.  Skin:    General: Skin is warm and dry.  Neurological:     Mental Status: He is alert and oriented to person, place, and time.  Psychiatric:        Behavior: Behavior normal.        Thought Content: Thought content normal.     BP (!) 138/96   Pulse 90   Temp 98.1 F (36.7 C) (Oral)   Resp 16   Wt 176 lb (79.8 kg)   SpO2 99%   BMI 26.76 kg/m  Wt Readings from Last 3 Encounters:  07/03/22 176 lb (79.8 kg)  04/03/22 171 lb (77.6 kg)  03/12/22 174 lb (78.9 kg)       Assessment & Plan:   Problem List Items Addressed This Visit       Unprioritized   History of hypertension    BP a little elevated today. He states home readings are much better. I advised pt to check blood pressure once daily for a few days and then send me his readings via mychart.  Continue current dose of amlodipine and metoprolol for now.       Gastroesophageal reflux disease - Primary    Stable on nexium 20mg  once daily. Continue same.       Dyslipidemia    Trigs were elevated last time. Discussed limiting sugars in his diet.        I have discontinued Hilaria Ota "Jeff"'s nortriptyline and celecoxib. I am also having him maintain his aspirin EC, esomeprazole, metoprolol succinate, amLODipine, and rosuvastatin.  No orders of the defined types were placed in this encounter.

## 2022-07-03 NOTE — Assessment & Plan Note (Signed)
BP a little elevated today. He states home readings are much better. I advised pt to check blood pressure once daily for a few days and then send me his readings via mychart.  Continue current dose of amlodipine and metoprolol for now.

## 2022-07-12 ENCOUNTER — Ambulatory Visit (HOSPITAL_BASED_OUTPATIENT_CLINIC_OR_DEPARTMENT_OTHER): Payer: BC Managed Care – PPO | Admitting: Internal Medicine

## 2022-08-21 ENCOUNTER — Other Ambulatory Visit: Payer: Self-pay | Admitting: Family

## 2023-01-06 ENCOUNTER — Encounter: Payer: Self-pay | Admitting: *Deleted

## 2023-01-24 ENCOUNTER — Other Ambulatory Visit: Payer: Self-pay | Admitting: Family

## 2023-02-15 ENCOUNTER — Other Ambulatory Visit: Payer: Self-pay | Admitting: Family

## 2023-02-18 NOTE — Telephone Encounter (Signed)
Rx sent 

## 2023-02-27 ENCOUNTER — Other Ambulatory Visit: Payer: Self-pay | Admitting: Cardiology

## 2023-03-21 DIAGNOSIS — Z0189 Encounter for other specified special examinations: Secondary | ICD-10-CM | POA: Diagnosis not present

## 2023-03-21 DIAGNOSIS — M25561 Pain in right knee: Secondary | ICD-10-CM | POA: Diagnosis not present

## 2023-03-21 DIAGNOSIS — M545 Low back pain, unspecified: Secondary | ICD-10-CM | POA: Diagnosis not present

## 2023-03-21 DIAGNOSIS — M25562 Pain in left knee: Secondary | ICD-10-CM | POA: Diagnosis not present

## 2023-03-21 DIAGNOSIS — M170 Bilateral primary osteoarthritis of knee: Secondary | ICD-10-CM | POA: Diagnosis not present

## 2023-03-23 DIAGNOSIS — L509 Urticaria, unspecified: Secondary | ICD-10-CM | POA: Diagnosis not present

## 2023-03-23 DIAGNOSIS — T63441A Toxic effect of venom of bees, accidental (unintentional), initial encounter: Secondary | ICD-10-CM | POA: Diagnosis not present

## 2023-04-08 DIAGNOSIS — L500 Allergic urticaria: Secondary | ICD-10-CM | POA: Diagnosis not present

## 2023-04-08 DIAGNOSIS — T63441A Toxic effect of venom of bees, accidental (unintentional), initial encounter: Secondary | ICD-10-CM | POA: Diagnosis not present

## 2023-04-09 ENCOUNTER — Encounter: Payer: Self-pay | Admitting: Family

## 2023-04-11 DIAGNOSIS — M25562 Pain in left knee: Secondary | ICD-10-CM | POA: Diagnosis not present

## 2023-04-11 DIAGNOSIS — M25561 Pain in right knee: Secondary | ICD-10-CM | POA: Diagnosis not present

## 2023-05-26 ENCOUNTER — Other Ambulatory Visit: Payer: Self-pay | Admitting: Cardiology

## 2023-06-10 ENCOUNTER — Other Ambulatory Visit: Payer: Self-pay | Admitting: Cardiology

## 2023-06-27 ENCOUNTER — Encounter: Payer: Self-pay | Admitting: Family

## 2023-06-27 MED ORDER — AMOXICILLIN 500 MG PO CAPS: 500.0000 mg | ORAL_CAPSULE | Freq: Three times a day (TID) | ORAL | 0 refills | Status: DC

## 2023-09-05 ENCOUNTER — Other Ambulatory Visit: Payer: Self-pay | Admitting: Cardiology

## 2023-09-11 ENCOUNTER — Other Ambulatory Visit: Payer: Self-pay | Admitting: Family

## 2023-09-25 ENCOUNTER — Other Ambulatory Visit: Payer: Self-pay | Admitting: Family

## 2024-01-12 ENCOUNTER — Other Ambulatory Visit: Payer: Self-pay | Admitting: Cardiology

## 2024-01-12 ENCOUNTER — Other Ambulatory Visit: Payer: Self-pay | Admitting: Family

## 2024-01-23 ENCOUNTER — Encounter (HOSPITAL_BASED_OUTPATIENT_CLINIC_OR_DEPARTMENT_OTHER): Payer: Self-pay | Admitting: Emergency Medicine

## 2024-01-23 ENCOUNTER — Emergency Department (HOSPITAL_BASED_OUTPATIENT_CLINIC_OR_DEPARTMENT_OTHER)

## 2024-01-23 ENCOUNTER — Inpatient Hospital Stay (HOSPITAL_BASED_OUTPATIENT_CLINIC_OR_DEPARTMENT_OTHER)
Admission: EM | Admit: 2024-01-23 | Discharge: 2024-01-27 | DRG: 392 | Disposition: A | Discharge status: Home / Self Care | Attending: Family Medicine | Admitting: Family Medicine

## 2024-01-23 ENCOUNTER — Other Ambulatory Visit: Payer: Self-pay

## 2024-01-23 DIAGNOSIS — M1990 Unspecified osteoarthritis, unspecified site: Secondary | ICD-10-CM | POA: Diagnosis present

## 2024-01-23 DIAGNOSIS — Z7982 Long term (current) use of aspirin: Secondary | ICD-10-CM

## 2024-01-23 DIAGNOSIS — K5792 Diverticulitis of intestine, part unspecified, without perforation or abscess without bleeding: Secondary | ICD-10-CM | POA: Diagnosis not present

## 2024-01-23 DIAGNOSIS — E785 Hyperlipidemia, unspecified: Secondary | ICD-10-CM | POA: Diagnosis present

## 2024-01-23 DIAGNOSIS — K5732 Diverticulitis of large intestine without perforation or abscess without bleeding: Secondary | ICD-10-CM

## 2024-01-23 DIAGNOSIS — Z79899 Other long term (current) drug therapy: Secondary | ICD-10-CM

## 2024-01-23 DIAGNOSIS — Z9889 Other specified postprocedural states: Secondary | ICD-10-CM

## 2024-01-23 DIAGNOSIS — K5720 Diverticulitis of large intestine with perforation and abscess without bleeding: Principal | ICD-10-CM | POA: Diagnosis present

## 2024-01-23 DIAGNOSIS — I10 Essential (primary) hypertension: Secondary | ICD-10-CM | POA: Diagnosis present

## 2024-01-23 DIAGNOSIS — N400 Enlarged prostate without lower urinary tract symptoms: Secondary | ICD-10-CM | POA: Diagnosis not present

## 2024-01-23 DIAGNOSIS — F1010 Alcohol abuse, uncomplicated: Secondary | ICD-10-CM | POA: Diagnosis present

## 2024-01-23 DIAGNOSIS — Z720 Tobacco use: Secondary | ICD-10-CM

## 2024-01-23 DIAGNOSIS — R1030 Lower abdominal pain, unspecified: Secondary | ICD-10-CM | POA: Diagnosis not present

## 2024-01-23 DIAGNOSIS — Z8249 Family history of ischemic heart disease and other diseases of the circulatory system: Secondary | ICD-10-CM

## 2024-01-23 DIAGNOSIS — K219 Gastro-esophageal reflux disease without esophagitis: Secondary | ICD-10-CM | POA: Diagnosis present

## 2024-01-23 DIAGNOSIS — K8020 Calculus of gallbladder without cholecystitis without obstruction: Secondary | ICD-10-CM | POA: Diagnosis not present

## 2024-01-23 DIAGNOSIS — K5900 Constipation, unspecified: Secondary | ICD-10-CM | POA: Diagnosis present

## 2024-01-23 LAB — URINALYSIS, MICROSCOPIC (REFLEX)
Bacteria, UA: NONE SEEN
Squamous Epithelial / HPF: NONE SEEN /HPF (ref 0–5)
WBC, UA: NONE SEEN WBC/hpf (ref 0–5)

## 2024-01-23 LAB — CBC
HCT: 41.1 % (ref 39.0–52.0)
Hemoglobin: 14.4 g/dL (ref 13.0–17.0)
MCH: 31 pg (ref 26.0–34.0)
MCHC: 35 g/dL (ref 30.0–36.0)
MCV: 88.6 fL (ref 80.0–100.0)
Platelets: 200 10*3/uL (ref 150–400)
RBC: 4.64 MIL/uL (ref 4.22–5.81)
RDW: 12.5 % (ref 11.5–15.5)
WBC: 10.4 10*3/uL (ref 4.0–10.5)
nRBC: 0 % (ref 0.0–0.2)

## 2024-01-23 LAB — URINALYSIS, ROUTINE W REFLEX MICROSCOPIC
Bilirubin Urine: NEGATIVE
Glucose, UA: NEGATIVE mg/dL
Hgb urine dipstick: NEGATIVE
Ketones, ur: NEGATIVE mg/dL
Leukocytes,Ua: NEGATIVE
Nitrite: NEGATIVE
Protein, ur: 30 mg/dL — AB
Specific Gravity, Urine: >=1.03 (ref 1.005–1.030)
pH: 6 (ref 5.0–8.0)

## 2024-01-23 LAB — COMPREHENSIVE METABOLIC PANEL WITH GFR
ALT: 29 U/L (ref 0–44)
AST: 24 U/L (ref 15–41)
Albumin: 4.4 g/dL (ref 3.5–5.0)
Alkaline Phosphatase: 70 U/L (ref 38–126)
Anion gap: 14 (ref 5–15)
BUN: 12 mg/dL (ref 6–20)
CO2: 23 mmol/L (ref 22–32)
Calcium: 9.3 mg/dL (ref 8.9–10.3)
Chloride: 100 mmol/L (ref 98–111)
Creatinine, Ser: 1.06 mg/dL (ref 0.61–1.24)
GFR, Estimated: >60 mL/min (ref >60)
Glucose, Bld: 108 mg/dL — ABNORMAL HIGH (ref 70–99)
Potassium: 3.6 mmol/L (ref 3.5–5.1)
Sodium: 137 mmol/L (ref 135–145)
Total Bilirubin: 0.7 mg/dL (ref 0.0–1.2)
Total Protein: 7.1 g/dL (ref 6.5–8.1)

## 2024-01-23 LAB — LIPASE, BLOOD: Lipase: 34 U/L (ref 11–51)

## 2024-01-23 MED ORDER — SODIUM CHLORIDE 0.9 % IV SOLN
2.0000 g | INTRAVENOUS | Status: DC
  Administered 2024-01-24 – 2024-01-26 (×3): 2 g via INTRAVENOUS
  Filled 2024-01-23 (×3): qty 20

## 2024-01-23 MED ORDER — IOHEXOL 300 MG/ML  SOLN
80.0000 mL | Freq: Once | INTRAMUSCULAR | Status: AC | PRN
  Administered 2024-01-23: 80 mL via INTRAVENOUS

## 2024-01-23 MED ORDER — METRONIDAZOLE 500 MG/100ML IV SOLN
500.0000 mg | Freq: Two times a day (BID) | INTRAVENOUS | Status: DC
  Administered 2024-01-23 – 2024-01-26 (×7): 500 mg via INTRAVENOUS
  Filled 2024-01-23 (×8): qty 100

## 2024-01-23 MED ORDER — ACETAMINOPHEN 650 MG RE SUPP: 650.0000 mg | Freq: Four times a day (QID) | RECTAL | Status: DC | PRN

## 2024-01-23 MED ORDER — ASPIRIN 81 MG PO TBEC
81.0000 mg | DELAYED_RELEASE_TABLET | Freq: Every day | ORAL | Status: DC
Start: 2024-01-24 — End: 2024-01-25
  Administered 2024-01-24: 81 mg via ORAL
  Filled 2024-01-23: qty 1

## 2024-01-23 MED ORDER — MORPHINE SULFATE (PF) 4 MG/ML IV SOLN
4.0000 mg | Freq: Once | INTRAVENOUS | Status: AC
  Administered 2024-01-23: 4 mg via INTRAVENOUS
  Filled 2024-01-23: qty 1

## 2024-01-23 MED ORDER — SODIUM CHLORIDE 0.9 % IV SOLN
2.0000 g | Freq: Once | INTRAVENOUS | Status: AC
  Administered 2024-01-23: 2 g via INTRAVENOUS
  Filled 2024-01-23: qty 20

## 2024-01-23 MED ORDER — METOPROLOL SUCCINATE ER 50 MG PO TB24
50.0000 mg | ORAL_TABLET | Freq: Every day | ORAL | Status: DC
  Administered 2024-01-24 – 2024-01-27 (×4): 50 mg via ORAL
  Filled 2024-01-23 (×4): qty 1

## 2024-01-23 MED ORDER — ACETAMINOPHEN 325 MG PO TABS
650.0000 mg | ORAL_TABLET | Freq: Four times a day (QID) | ORAL | Status: DC | PRN
  Administered 2024-01-23 – 2024-01-27 (×3): 650 mg via ORAL
  Filled 2024-01-23 (×3): qty 2

## 2024-01-23 MED ORDER — BUTALBITAL-APAP-CAFFEINE 50-325-40 MG PO TABS
1.0000 | ORAL_TABLET | Freq: Once | ORAL | Status: AC
  Administered 2024-01-23: 1 via ORAL
  Filled 2024-01-23: qty 1

## 2024-01-23 MED ORDER — ONDANSETRON HCL 4 MG/2ML IJ SOLN: 4.0000 mg | Freq: Four times a day (QID) | INTRAMUSCULAR | Status: DC | PRN

## 2024-01-23 MED ORDER — AMLODIPINE BESYLATE 10 MG PO TABS
10.0000 mg | ORAL_TABLET | Freq: Every day | ORAL | Status: DC
  Administered 2024-01-24 – 2024-01-27 (×4): 10 mg via ORAL
  Filled 2024-01-23 (×4): qty 1

## 2024-01-23 MED ORDER — ONDANSETRON HCL 4 MG PO TABS: 4.0000 mg | ORAL_TABLET | Freq: Four times a day (QID) | ORAL | Status: DC | PRN

## 2024-01-23 MED ORDER — METRONIDAZOLE 500 MG/100ML IV SOLN
500.0000 mg | Freq: Once | INTRAVENOUS | Status: AC
  Administered 2024-01-23: 500 mg via INTRAVENOUS
  Filled 2024-01-23: qty 100

## 2024-01-23 MED ORDER — PANTOPRAZOLE SODIUM 40 MG PO TBEC
40.0000 mg | DELAYED_RELEASE_TABLET | Freq: Every day | ORAL | Status: DC
  Administered 2024-01-24 – 2024-01-27 (×4): 40 mg via ORAL
  Filled 2024-01-23 (×4): qty 1

## 2024-01-23 MED ORDER — HYDROMORPHONE HCL 1 MG/ML IJ SOLN
0.5000 mg | Freq: Four times a day (QID) | INTRAMUSCULAR | Status: DC | PRN
  Administered 2024-01-23 – 2024-01-25 (×2): 0.5 mg via INTRAVENOUS
  Filled 2024-01-23 (×3): qty 0.5

## 2024-01-23 MED ORDER — SODIUM CHLORIDE 0.9 % IV SOLN: INTRAVENOUS | Status: AC

## 2024-01-23 MED ORDER — SENNOSIDES-DOCUSATE SODIUM 8.6-50 MG PO TABS: 1.0000 | ORAL_TABLET | Freq: Every evening | ORAL | Status: DC | PRN

## 2024-01-23 MED ORDER — FENTANYL CITRATE PF 50 MCG/ML IJ SOSY
50.0000 ug | PREFILLED_SYRINGE | Freq: Once | INTRAMUSCULAR | Status: AC
  Administered 2024-01-23: 50 ug via INTRAVENOUS
  Filled 2024-01-23: qty 1

## 2024-01-23 MED ORDER — ROSUVASTATIN CALCIUM 10 MG PO TABS
20.0000 mg | ORAL_TABLET | Freq: Every day | ORAL | Status: DC
  Administered 2024-01-24 – 2024-01-27 (×4): 20 mg via ORAL
  Filled 2024-01-23 (×4): qty 2

## 2024-01-23 NOTE — ED Notes (Signed)
 Patient transported to CT

## 2024-01-23 NOTE — Progress Notes (Signed)
 Facility requesting transfer: [facility] Requesting Provider: Dr. Dolan Freiberg Reason for transfer: IV abx for diverticulitis  Facility course:  Darren Scott is a 53 y.o. male with PMH significant for HTN, HLD, GERD, arthritis. Presented to med [name] today with complaint of abdominal pain not responding to outpatient medicines. No fever,   Hemodynamically stable WBC count normal CT Sigmoid diverticulitis with macroperforation. EDP discussed with general surgery.  Recommended IV Rocephin  and IV Flagyl  and inpatient admission to [facility] or [facility].Aaron Aas  Observation order placed for MedSurg bed Please note that [facility] will assume the care once patient arrives to the hospital. Prior to that, please reach out to the ED provider on site for any medical need.   Signed, Hoyt Macleod, MD [facility] 01/23/2024

## 2024-01-23 NOTE — ED Triage Notes (Signed)
 Pt POV staggered gait- c/o suprapubic pain since Wednesday.  OTC meds ineffective. Denies fever, nausea, emesis, diarrhea. Denies urinary sx.   Good po intake.

## 2024-01-23 NOTE — H&P (Signed)
 History and Physical  Coron Graser WUX:[id number] DOB: 1971/03/06 DOA: 01/23/2024  PCP: Dorrene Gaucher, NP   Chief Complaint: Abdominal pain  HPI: Darren Scott is a 53 y.o. male with medical history significant for hypertension, hyperlipidemia, arthritis and GERD who presented to med [facility] ED for evaluation of abdominal pain. Patient reports he started having lower abdominal pain on Wednesday. He was able to tolerate the pain until it worsened today. He usually have 1 bowel movement a day but has not had any since Wednesday. The pain is worsened with movement however he was able to tolerate some food earlier this morning. He reports feeling thirsty but denies any nausea, vomiting, diarrhea, fevers, chills, flank pain, bloody stools, hematuria or dysuria.  [facility] ED Course: Initial vitals showed temp 98.2, RR 15, HR 94, BP 133/101, SpO2 89% on room air.  Labs significant for lipase 34, WBC 10.4, Hgb 14.4, blood glucose 108, normal kidney function, LFTs and electrolytes, UA with no signs of infection. CT A/P shows mild to moderate sigmoid diverticulitis with microperforation but no evidence of abscess or bowel obstruction. Patient received IV fentanyl  50 mcg x 1, IV morphine  4 mg x 1, IV Rocephin , and IV Flagyl . General surgery was consulted for evaluation and recommended admission for observation. Patient was admitted to [facility] service and transferred to [facility].  Review of Systems: Please see HPI for pertinent positives and negatives. A complete 10 system review of systems are otherwise negative.  Past Medical History:  Diagnosis Date   Acute bronchitis 08/16/2014   Acute medial meniscal tear 02/25/2022   Arthritis    LEFT knee   Atypical chest pain 02/20/2018   Dyslipidemia 02/20/2018   Frequent headaches    uses PRN meds   GERD (gastroesophageal reflux disease)    on meds   History of hypertension 12/29/2013   Hyperglycemia 12/29/2013   Hyperlipidemia     Hypertension    on meds   Other and unspecified hyperlipidemia 12/29/2013   on meds   Routine general medical examination at a health care facility 12/29/2013   Seasonal allergies    Past Surgical History:  Procedure Laterality Date   KNEE ARTHROSCOPY Bilateral 04/25/2022   KNEE SURGERY Left 2007   WISDOM TOOTH EXTRACTION     Social History:  reports that he has never smoked. His smokeless tobacco use includes chew. He reports current alcohol use of about 18.0 standard drinks of alcohol per week. He reports that he does not use drugs.  No Known Allergies  Family History  Problem Relation Age of Onset   Heart attack Father        died in his 40's, smoker   Cancer Neg Hx    Diabetes Neg Hx    Colon cancer Neg Hx    Colon polyps Neg Hx    Esophageal cancer Neg Hx    Rectal cancer Neg Hx    Stomach cancer Neg Hx      Prior to Admission medications   Medication Sig Start Date End Date Taking? Authorizing Provider  amLODipine  (NORVASC ) 10 MG tablet TAKE 1 TABLET BY MOUTH EVERY DAY 02/18/23  Yes Dorrene Gaucher, NP  aspirin  EC 81 MG tablet Take 1 tablet (81 mg total) by mouth daily. 11/24/19  Yes Tobb, Kardie, DO  esomeprazole (NEXIUM) 20 MG capsule Take 20 mg by mouth daily at 12 noon.   Yes [provider]  metoprolol  succinate (TOPROL -XL) 50 MG 24 hr tablet Take 1 tablet (50  mg total) by mouth daily. 09/11/23  Yes Dorrene Gaucher, NP  rosuvastatin  (CRESTOR ) 20 MG tablet TAKE 1 TABLET BY MOUTH EVERY DAY 09/05/23  Yes Swinyer, Leilani Punter, NP    Physical Exam: BP (!) 149/88 (BP Location: Right Arm)   Pulse (!) 115   Temp 99.8 F (37.7 C) (Oral)   Resp 18   Ht 5\' 8"  (1.727 m)   Wt 74.4 kg   SpO2 98%   BMI 24.94 kg/m  General: Pleasant, ill-appearing middle-age man laying in bed. No acute distress. HEENT: [place]/AT. Anicteric sclera.  Dry mucous membrane CV: RRR. No murmurs, rubs, or gallops. No LE edema Pulmonary: Lungs CTAB. Normal effort. No wheezing or  rales. Abdominal: Soft, nondistended. Tenderness to palpation of the lower abdomen, LLQ>RLQ.  Normal bowel sounds. Extremities: Palpable radial and DP pulses. Normal ROM. Skin: Warm and dry. No obvious rash or lesions. Neuro: A&Ox3. Moves all extremities. Normal sensation to light touch. No focal deficit. Psych: Normal mood and affect          Labs on Admission:  Basic Metabolic Panel: Recent Labs  Lab 01/23/24 1210  NA 137  K 3.6  CL 100  CO2 23  GLUCOSE 108*  BUN 12  CREATININE 1.06  CALCIUM  9.3   Liver Function Tests: Recent Labs  Lab 01/23/24 1210  AST 24  ALT 29  ALKPHOS 70  BILITOT 0.7  PROT 7.1  ALBUMIN 4.4   Recent Labs  Lab 01/23/24 1210  LIPASE 34   No results for input(s): "AMMONIA" in the last 168 hours. CBC: Recent Labs  Lab 01/23/24 1210  WBC 10.4  HGB 14.4  HCT 41.1  MCV 88.6  PLT 200   Cardiac Enzymes: No results for input(s): "CKTOTAL", "CKMB", "CKMBINDEX", "TROPONINI" in the last 168 hours. BNP (last 3 results) No results for input(s): "BNP" in the last 8760 hours.  ProBNP (last 3 results) No results for input(s): "PROBNP" in the last 8760 hours.  CBG: No results for input(s): "GLUCAP" in the last 168 hours.  Radiological Exams on Admission: CT ABDOMEN PELVIS W CONTRAST Result Date: 01/23/2024 CLINICAL DATA:  Lower abdominal and suprapubic pelvic pain for several days. EXAM: CT ABDOMEN AND PELVIS WITH CONTRAST TECHNIQUE: Multidetector CT imaging of the abdomen and pelvis was performed using the standard protocol following bolus administration of intravenous contrast. RADIATION DOSE REDUCTION: This exam was performed according to the departmental dose-optimization program which includes automated exposure control, adjustment of the mA and/or kV according to patient size and/or use of iterative reconstruction technique. CONTRAST:  80mL OMNIPAQUE  IOHEXOL  300 MG/ML  SOLN COMPARISON:  None Available. FINDINGS: Lower Chest: No acute findings.  Hepatobiliary: No suspicious hepatic masses identified. A few tiny sub-centimeter cysts are noted in the left hepatic lobe. Tiny 2-3 mm gallstone seen. No evidence of cholecystitis or biliary ductal dilatation. Pancreas:  No mass or inflammatory changes. Spleen: Within normal limits in size and appearance. Adrenals/Urinary Tract: No suspicious masses identified. No evidence of ureteral calculi or hydronephrosis. Stomach/Bowel: Mild-to-moderate sigmoid diverticulitis seen with a small adjacent extraluminal air collection, consistent with micro perforation. No evidence of free intraperitoneal air, abscess or bowel obstruction. Vascular/Lymphatic: No pathologically enlarged lymph nodes. No acute vascular findings. Reproductive:  Mildly enlarged prostate. Other:  None. Musculoskeletal:  No suspicious bone lesions identified. IMPRESSION: Mild-to-moderate sigmoid diverticulitis, with microperforation. No evidence of abscess or bowel obstruction. Mildly enlarged prostate. Cholelithiasis. No radiographic evidence of cholecystitis. Electronically Signed   By: Marlyce Sine M.D.   On:  01/23/2024 14:19   Assessment/Plan Darren Scott is a 53 y.o. male with medical history significant for hypertension, hyperlipidemia, arthritis and GERD who presented to the ED for evaluation of abdominal pain and found to have sigmoid diverticulitis.  # Sigmoid diverticulitis -Pt presented with 3 days of progressive lower abdominal pain and constipation -Unremarkable vitals, nontoxic-appearing and without leukocytosis -CT A/P shows mild to moderate sigmoid diverticulitis with microperforation but no abscess or bowel obstruction -General Surgery following, appreciate recs -Admit to MedSurg for observation -Continue IV Rocephin  and Flagyl  -Pain control with PRN Tylenol  and IV Dilaudid  -IV NS at 100 cc/h for 1 day -Trend CBC and fever curve -Bowel rest  # HTN - BP slightly elevated with SBP in the 130s to 150s in the setting  of acute abdominal pain -Continue home amlodipine  and Toprol -XL  # HLD -Continue rosuvastatin   # GERD -PPI   DVT prophylaxis: SCDs    Code Status: Full Code  Consults called: General Surgery  Family Communication: No family at bedside  Severity of Illness: The appropriate patient status for this patient is OBSERVATION. Observation status is judged to be reasonable and necessary in order to provide the required intensity of service to ensure the patient's safety. The patient's presenting symptoms, physical exam findings, and initial radiographic and laboratory data in the context of their medical condition is felt to place them at decreased risk for further clinical deterioration. Furthermore, it is anticipated that the patient will be medically stable for discharge from the hospital within 2 midnights of admission.   Level of care: Med-Surg   This record has been created using Conservation officer, historic buildings. Errors have been sought and corrected, but may not always be located. Such creation errors do not reflect on the standard of care.   Vita Grip, MD 01/23/2024, 6:38 PM [facility] Pager: [phone] Isaiah 41:10   If 7PM-7AM, please contact night-coverage [email or web address] Password [email or web address]

## 2024-01-23 NOTE — ED Provider Notes (Signed)
 [place] EMERGENCY DEPARTMENT AT [facility] Provider Note   CSN: [id number] Arrival date & time: 01/23/24  1133     History  Chief Complaint  Patient presents with   Abdominal Pain    Darren Scott is a 53 y.o. male.   Abdominal Pain   53 year old male presents emergency department with complaints of abdominal pain.  States that he has been abdominal pain since Wednesday.  Reports pain in the lower middle abdomen without radiation.  States that he has had difficulty with regular bowel movement since then.  Has tried milk of magnesia but is only been having small amounts of more watery stools.  States that he usually has soft bowel movement.  Denies any fevers, chills, nausea, vomiting, urinary symptoms.  Denies any history of abdominal surgeries.  States that last colonoscopy was December 2022 which was normal.  Past medical history significant for hypertension, hyperlipidemia, GERD, alcohol abuse  Home Medications Prior to Admission medications   Medication Sig Start Date End Date Taking? Authorizing Provider  amLODipine  (NORVASC ) 10 MG tablet TAKE 1 TABLET BY MOUTH EVERY DAY 02/18/23  Yes Dorrene Gaucher, NP  aspirin  EC 81 MG tablet Take 1 tablet (81 mg total) by mouth daily. 11/24/19  Yes Tobb, Kardie, DO  esomeprazole (NEXIUM) 20 MG capsule Take 20 mg by mouth daily at 12 noon.   Yes [provider]  metoprolol  succinate (TOPROL -XL) 50 MG 24 hr tablet Take 1 tablet (50 mg total) by mouth daily. 09/11/23  Yes Dorrene Gaucher, NP  rosuvastatin  (CRESTOR ) 20 MG tablet TAKE 1 TABLET BY MOUTH EVERY DAY 09/05/23  Yes Swinyer, Leilani Punter, NP  amoxicillin  (AMOXIL ) 500 MG capsule Take 1 capsule (500 mg total) by mouth 3 (three) times daily. Patient not taking: Reported on 01/23/2024 06/27/23   O'Sullivan, [name], NP      Allergies    Patient has no known allergies.    Review of Systems   Review of Systems  Gastrointestinal:  Positive for abdominal pain.     Physical Exam Updated Vital Signs BP (!) 133/91   Pulse 97   Temp 98.2 F (36.8 C)   Resp 15   Ht 5\' 8"  (1.727 m)   Wt 74.4 kg   SpO2 94%   BMI 24.94 kg/m  Physical Exam Vitals and nursing note reviewed.  Constitutional:      General: He is not in acute distress.    Appearance: He is well-developed.  HENT:     Head: Normocephalic and atraumatic.  Eyes:     Conjunctiva/sclera: Conjunctivae normal.  Cardiovascular:     Rate and Rhythm: Normal rate and regular rhythm.     Heart sounds: No murmur heard. Pulmonary:     Effort: Pulmonary effort is normal. No respiratory distress.     Breath sounds: Normal breath sounds.  Abdominal:     Palpations: Abdomen is soft.     Tenderness: There is abdominal tenderness in the suprapubic area and left lower quadrant. There is no right CVA tenderness, left CVA tenderness or guarding. Negative signs include Murphy's sign and McBurney's sign.  Musculoskeletal:        General: No swelling.     Cervical back: Neck supple.  Skin:    General: Skin is warm and dry.     Capillary Refill: Capillary refill takes less than 2 seconds.  Neurological:     Mental Status: He is alert.  Psychiatric:        Mood and Affect: Mood  normal.     ED Results / Procedures / Treatments   Labs (all labs ordered are listed, but only abnormal results are displayed) Labs Reviewed  COMPREHENSIVE METABOLIC PANEL WITH GFR - Abnormal; Notable for the following components:      Result Value   Glucose, Bld 108 (*)    All other components within normal limits  URINALYSIS, ROUTINE W REFLEX MICROSCOPIC - Abnormal; Notable for the following components:   Protein, ur 30 (*)    All other components within normal limits  LIPASE, BLOOD  CBC  URINALYSIS, MICROSCOPIC (REFLEX)    EKG None  Radiology CT ABDOMEN PELVIS W CONTRAST Result Date: 01/23/2024 CLINICAL DATA:  Lower abdominal and suprapubic pelvic pain for several days. EXAM: CT ABDOMEN AND PELVIS WITH  CONTRAST TECHNIQUE: Multidetector CT imaging of the abdomen and pelvis was performed using the standard protocol following bolus administration of intravenous contrast. RADIATION DOSE REDUCTION: This exam was performed according to the departmental dose-optimization program which includes automated exposure control, adjustment of the mA and/or kV according to patient size and/or use of iterative reconstruction technique. CONTRAST:  80mL OMNIPAQUE  IOHEXOL  300 MG/ML  SOLN COMPARISON:  None Available. FINDINGS: Lower Chest: No acute findings. Hepatobiliary: No suspicious hepatic masses identified. A few tiny sub-centimeter cysts are noted in the left hepatic lobe. Tiny 2-3 mm gallstone seen. No evidence of cholecystitis or biliary ductal dilatation. Pancreas:  No mass or inflammatory changes. Spleen: Within normal limits in size and appearance. Adrenals/Urinary Tract: No suspicious masses identified. No evidence of ureteral calculi or hydronephrosis. Stomach/Bowel: Mild-to-moderate sigmoid diverticulitis seen with a small adjacent extraluminal air collection, consistent with micro perforation. No evidence of free intraperitoneal air, abscess or bowel obstruction. Vascular/Lymphatic: No pathologically enlarged lymph nodes. No acute vascular findings. Reproductive:  Mildly enlarged prostate. Other:  None. Musculoskeletal:  No suspicious bone lesions identified. IMPRESSION: Mild-to-moderate sigmoid diverticulitis, with microperforation. No evidence of abscess or bowel obstruction. Mildly enlarged prostate. Cholelithiasis. No radiographic evidence of cholecystitis. Electronically Signed   By: Marlyce Sine M.D.   On: 01/23/2024 14:19    Procedures Procedures    Medications Ordered in ED Medications  cefTRIAXone  (ROCEPHIN ) 2 g in sodium chloride  0.9 % 100 mL IVPB (0 g Intravenous Stopped 01/23/24 1513)    And  metroNIDAZOLE  (FLAGYL ) IVPB 500 mg (500 mg Intravenous New Bag/Given 01/23/24 1513)  morphine  (PF) 4 MG/ML  injection 4 mg (4 mg Intravenous Given 01/23/24 1213)  iohexol  (OMNIPAQUE ) 300 MG/ML solution 80 mL (80 mLs Intravenous Contrast Given 01/23/24 1340)  fentaNYL  (SUBLIMAZE ) injection 50 mcg (50 mcg Intravenous Given 01/23/24 1515)    ED Course/ Medical Decision Making/ A&P Clinical Course as of 01/23/24 1536  Fri Jan 23, 2024  1446 Consulted general surgery lives regarding the patient.  Recommended admission to either [facility] or [facility] for observation.  Will admit to hospitalist. [CR]  [phone] Consulted hospitalist Dr. Gwynneth Lessen who agreed with admission and assume further treatment/care. [CR]    Clinical Course User Index [CR] [place] Butter, PA                                 Medical Decision Making Amount and/or Complexity of Data Reviewed Labs: ordered. Radiology: ordered.  Risk Prescription drug management. Decision regarding hospitalization.  This patient presents to the ED for concern of abdominal pain, this involves an extensive number of treatment options, and is a complaint that carries with it a  high risk of complications and morbidity.  The differential diagnosis includes gastritis, PUD, cholecystitis, CBD pathology, SBO/LBO, volvulus, diverticulitis, appendicitis, nephrolithiasis, pyelonephritis, other     Co morbidities that complicate the patient evaluation   See HPI     Additional history obtained:   Additional history obtained from EMR External records from outside source obtained and reviewed including records     Lab Tests:   I Ordered, and personally interpreted labs.  The pertinent results include: UA with 30 proteins otherwise unremarkable.  No leukocytosis.  No evidence of anemia.  Platelets within range.  No Electra abnormalities.  No transaminitis.  No renal dysfunction.  Lipase within normal limits.     Imaging Studies ordered:   I ordered imaging studies including CT abdomen pelvis  I independently visualized and interpreted imaging which showed mild  to moderate sigmoid diverticulitis with microperforation.  No evidence of abscess or bowel obstruction.  Mildly large prostate.  Cholelithiasis. I agree with the radiologist interpretation   Cardiac Monitoring: / EKG:   The patient was maintained on a cardiac monitor.  I personally viewed and interpreted the cardiac monitored which showed an underlying rhythm of: Sinus rhythm     Consultations Obtained:   See ED course  Problem List / ED Course / Critical interventions / Medication management   Diverticulitis abdominal pain I ordered medication including morphine    Reevaluation of the patient after these medicines showed that the patient improved I have reviewed the patients home medicines and have made adjustments as needed     Social Determinants of Health:   Denies tobacco, licit drug use.     Test / Admission - Considered:   Diverticulitis abdominal pain Vitals signs within normal range and stable throughout visit. Laboratory/imaging studies significant for: See above 53 year old male presents emergency department with complaints of abdominal pain.  States that he has been abdominal pain since Wednesday.  Reports pain in the lower middle abdomen without radiation.  States that he has had difficulty with regular bowel movement since then.  Has tried milk of magnesia but is only been having small amounts of more watery stools.  States that he usually has soft bowel movement.  Denies any fevers, chills, nausea, vomiting, urinary symptoms.  Denies any history of abdominal surgeries.  States that last colonoscopy was December 2022 which was normal On exam, tenderness suprapubic as well as left lower quadrant. Labs unremarkable for acute emergent process.  CT imaging concerning for diverticulitis with microperforation.  Patient being on antibiotics with pain control with with medications given.  General surgery consulted who agreed with admission for observation.  Consult hospitalist  agrees to admission.  Treatment plan discussed with patient and he acknowledged understanding was agreeable.  Patient stable on admission to the hospital.         Final Clinical Impression(s) / ED Diagnoses Final diagnoses:  Diverticulitis    Rx / DC Orders ED Discharge Orders     None         [place] Butter, [place] 01/23/24 1536    Roberts Ching, MD 01/28/24 1744

## 2024-01-24 DIAGNOSIS — K5732 Diverticulitis of large intestine without perforation or abscess without bleeding: Secondary | ICD-10-CM | POA: Diagnosis not present

## 2024-01-24 DIAGNOSIS — K8020 Calculus of gallbladder without cholecystitis without obstruction: Secondary | ICD-10-CM | POA: Diagnosis not present

## 2024-01-24 DIAGNOSIS — K5792 Diverticulitis of intestine, part unspecified, without perforation or abscess without bleeding: Secondary | ICD-10-CM | POA: Diagnosis not present

## 2024-01-24 DIAGNOSIS — I10 Essential (primary) hypertension: Secondary | ICD-10-CM | POA: Diagnosis not present

## 2024-01-24 DIAGNOSIS — Z9889 Other specified postprocedural states: Secondary | ICD-10-CM | POA: Diagnosis not present

## 2024-01-24 DIAGNOSIS — K5900 Constipation, unspecified: Secondary | ICD-10-CM | POA: Diagnosis not present

## 2024-01-24 DIAGNOSIS — M1990 Unspecified osteoarthritis, unspecified site: Secondary | ICD-10-CM | POA: Diagnosis not present

## 2024-01-24 DIAGNOSIS — R1030 Lower abdominal pain, unspecified: Secondary | ICD-10-CM | POA: Diagnosis not present

## 2024-01-24 DIAGNOSIS — N400 Enlarged prostate without lower urinary tract symptoms: Secondary | ICD-10-CM | POA: Diagnosis not present

## 2024-01-24 DIAGNOSIS — Z79899 Other long term (current) drug therapy: Secondary | ICD-10-CM | POA: Diagnosis not present

## 2024-01-24 DIAGNOSIS — Z7982 Long term (current) use of aspirin: Secondary | ICD-10-CM | POA: Diagnosis not present

## 2024-01-24 DIAGNOSIS — Z8249 Family history of ischemic heart disease and other diseases of the circulatory system: Secondary | ICD-10-CM | POA: Diagnosis not present

## 2024-01-24 DIAGNOSIS — R1084 Generalized abdominal pain: Secondary | ICD-10-CM | POA: Diagnosis not present

## 2024-01-24 DIAGNOSIS — F1010 Alcohol abuse, uncomplicated: Secondary | ICD-10-CM | POA: Diagnosis not present

## 2024-01-24 DIAGNOSIS — K219 Gastro-esophageal reflux disease without esophagitis: Secondary | ICD-10-CM | POA: Diagnosis not present

## 2024-01-24 DIAGNOSIS — K5720 Diverticulitis of large intestine with perforation and abscess without bleeding: Secondary | ICD-10-CM | POA: Diagnosis not present

## 2024-01-24 DIAGNOSIS — E785 Hyperlipidemia, unspecified: Secondary | ICD-10-CM | POA: Diagnosis not present

## 2024-01-24 DIAGNOSIS — Z720 Tobacco use: Secondary | ICD-10-CM | POA: Diagnosis not present

## 2024-01-24 LAB — CBC
HCT: 41.4 % (ref 39.0–52.0)
Hemoglobin: 13.8 g/dL (ref 13.0–17.0)
MCH: 30.9 pg (ref 26.0–34.0)
MCHC: 33.3 g/dL (ref 30.0–36.0)
MCV: 92.6 fL (ref 80.0–100.0)
Platelets: 173 10*3/uL (ref 150–400)
RBC: 4.47 MIL/uL (ref 4.22–5.81)
RDW: 12.5 % (ref 11.5–15.5)
WBC: 16 10*3/uL — ABNORMAL HIGH (ref 4.0–10.5)
nRBC: 0 % (ref 0.0–0.2)

## 2024-01-24 LAB — BASIC METABOLIC PANEL WITH GFR
Anion gap: 11 (ref 5–15)
BUN: 16 mg/dL (ref 6–20)
CO2: 22 mmol/L (ref 22–32)
Calcium: 8.4 mg/dL — ABNORMAL LOW (ref 8.9–10.3)
Chloride: 102 mmol/L (ref 98–111)
Creatinine, Ser: 1.07 mg/dL (ref 0.61–1.24)
GFR, Estimated: >60 mL/min (ref >60)
Glucose, Bld: 92 mg/dL (ref 70–99)
Potassium: 3.9 mmol/L (ref 3.5–5.1)
Sodium: 135 mmol/L (ref 135–145)

## 2024-01-24 LAB — HIV ANTIBODY (ROUTINE TESTING W REFLEX): HIV Screen 4th Generation wRfx: NONREACTIVE

## 2024-01-24 NOTE — Plan of Care (Signed)

## 2024-01-24 NOTE — Progress Notes (Signed)
  Progress Note   Patient: Darren Scott GNF:[id number] DOB: 1970-11-09 DOA: 01/23/2024     0 DOS: the patient was seen and examined on 01/24/2024 at 10:17AM      Brief hospital course: 53 y.o. M with HTN who presented with abdominal pain. CT abdomen showed sigmoid diverticulitis with microperforation.      Assessment and Plan: Acute sigmoid diverticulitis - Per general surgery  Hyperlipidemia - Continue Crestor   Essential hypertension Blood pressure normal - Continue amlodipine  and metoprolol          Subjective: Patient still feels bloated and uncomfortable.  No fever, no vomiting.     Physical Exam: BP 117/68 (BP Location: Left Arm)   Pulse 84   Temp 98.2 F (36.8 C) (Oral)   Resp 18   Ht 5\' 8"  (1.727 m)   Wt 74.4 kg   SpO2 97%   BMI 24.94 kg/m   Adult male, lying in bed, interactive and appropriate RRR, no murmurs, no peripheral edema Respiratory rate normal, lungs clear without rales or wheezes Abdomen soft, moderate tenderness, more in the left side, no rigidity or rebound Attention normal, affect appropriate, judgment and insight appear normal  Data Reviewed: CBC normal Basic metabolic panel normal  Family Communication: None present    Disposition: Status is: Inpatient Per surgery        Author: Ephriam Hashimoto, MD 01/24/2024 1:40 PM  For on call review [email or web address].

## 2024-01-24 NOTE — Hospital Course (Signed)
 53 y.o. M with HTN who presented with abdominal pain. CT abdomen showed sigmoid diverticulitis with microperforation.

## 2024-01-24 NOTE — Consult Note (Signed)
 Reason for Consult:Diverticulitis Referring Physician: Shelley Barbian is an 53 y.o. male.  HPI: This is a 53 year old male with hypertension, hyperlipidemia, GERD who presented with several days of lower abdominal pain.  This worsened yesterday so he presented to med [facility] for evaluation.  He had 1 small liquid bowel movement yesterday.  He denied any nausea, vomiting, fevers.  No melena or hematochezia.  He was evaluated in the emergency department was noted to have sigmoid diverticulitis with possible microperforation but no abscess or free air.  He was transferred to the hospital for admission and management of his problem.  He remains afebrile.  He has not had pain medicine in several hours.  No previous abdominal surgeries.  No previous episodes of diverticulitis.  His last colonoscopy was December 2022 by Dr. Willy Harvest and was normal.    Past Medical History:  Diagnosis Date   Acute bronchitis 08/16/2014   Acute medial meniscal tear 02/25/2022   Arthritis    LEFT knee   Atypical chest pain 02/20/2018   Dyslipidemia 02/20/2018   Frequent headaches    uses PRN meds   GERD (gastroesophageal reflux disease)    on meds   History of hypertension 12/29/2013   Hyperglycemia 12/29/2013   Hyperlipidemia    Hypertension    on meds   Other and unspecified hyperlipidemia 12/29/2013   on meds   Routine general medical examination at a health care facility 12/29/2013   Seasonal allergies     Past Surgical History:  Procedure Laterality Date   KNEE ARTHROSCOPY Bilateral 04/25/2022   KNEE SURGERY Left 2007   WISDOM TOOTH EXTRACTION      Family History  Problem Relation Age of Onset   Heart attack Father        died in his 12's, smoker   Cancer Neg Hx    Diabetes Neg Hx    Colon cancer Neg Hx    Colon polyps Neg Hx    Esophageal cancer Neg Hx    Rectal cancer Neg Hx    Stomach cancer Neg Hx     Social History:  reports that he has never smoked. His smokeless  tobacco use includes chew. He reports current alcohol use of about 18.0 standard drinks of alcohol per week. He reports that he does not use drugs.  Allergies: No Known Allergies  Medications:  Prior to Admission medications   Medication Sig Start Date End Date Taking? Authorizing Provider  amLODipine  (NORVASC ) 10 MG tablet TAKE 1 TABLET BY MOUTH EVERY DAY 02/18/23  Yes Dorrene Gaucher, NP  aspirin  EC 81 MG tablet Take 1 tablet (81 mg total) by mouth daily. 11/24/19  Yes Tobb, Kardie, DO  esomeprazole (NEXIUM) 20 MG capsule Take 20 mg by mouth daily at 12 noon.   Yes [provider]  metoprolol  succinate (TOPROL -XL) 50 MG 24 hr tablet Take 1 tablet (50 mg total) by mouth daily. 09/11/23  Yes Dorrene Gaucher, NP  rosuvastatin  (CRESTOR ) 20 MG tablet TAKE 1 TABLET BY MOUTH EVERY DAY 09/05/23  Yes Swinyer, Leilani Punter, NP     Results for orders placed or performed during the hospital encounter of 01/23/24 (from the past 48 hours)  Urinalysis, Routine w reflex microscopic -Urine, Clean Catch     Status: Abnormal   Collection Time: 01/23/24 11:42 AM  Result Value Ref Range   Color, Urine YELLOW YELLOW   APPearance CLEAR CLEAR   Specific Gravity, Urine >=1.030 1.005 - 1.030   pH 6.0 5.0 -  8.0   Glucose, UA NEGATIVE NEGATIVE mg/dL   Hgb urine dipstick NEGATIVE NEGATIVE   Bilirubin Urine NEGATIVE NEGATIVE   Ketones, ur NEGATIVE NEGATIVE mg/dL   Protein, ur 30 (A) NEGATIVE mg/dL   Nitrite NEGATIVE NEGATIVE   Leukocytes,Ua NEGATIVE NEGATIVE    Comment: Performed at [facility], [address] [place], [place] [address]  Urinalysis, Microscopic (reflex)     Status: None   Collection Time: 01/23/24 11:42 AM  Result Value Ref Range   RBC / HPF 0-5 0 - 5 RBC/hpf   WBC, UA NONE SEEN 0 - 5 WBC/hpf   Bacteria, UA NONE SEEN NONE SEEN   Squamous Epithelial / HPF NONE SEEN 0 - 5 /HPF   Mucus PRESENT     Comment: Performed at [facility] : [facility], [address]  [place], [place] [address]  Lipase, blood     Status: None   Collection Time: 01/23/24 12:10 PM  Result Value Ref Range   Lipase 34 11 - 51 U/L    Comment: Performed at [facility], [address] [place], [place] [address]  Comprehensive metabolic panel     Status: Abnormal   Collection Time: 01/23/24 12:10 PM  Result Value Ref Range   Sodium 137 135 - 145 mmol/L   Potassium 3.6 3.5 - 5.1 mmol/L   Chloride 100 98 - 111 mmol/L   CO2 23 22 - 32 mmol/L   Glucose, Bld 108 (H) 70 - 99 mg/dL    Comment: Glucose reference range applies only to samples taken after fasting for at least 8 hours.   BUN 12 6 - 20 mg/dL   Creatinine, Ser 6.96 0.61 - 1.24 mg/dL   Calcium  9.3 8.9 - 10.3 mg/dL   Total Protein 7.1 6.5 - 8.1 g/dL   Albumin 4.4 3.5 - 5.0 g/dL   AST 24 15 - 41 U/L   ALT 29 0 - 44 U/L   Alkaline Phosphatase 70 38 - 126 U/L   Total Bilirubin 0.7 0.0 - 1.2 mg/dL   GFR, Estimated >29 >52 mL/min    Comment: (NOTE) Calculated using the CKD-EPI Creatinine Equation (2021)    Anion gap 14 5 - 15    Comment: Performed at [facility], [address] [place], [place] [address]  CBC     Status: None   Collection Time: 01/23/24 12:10 PM  Result Value Ref Range   WBC 10.4 4.0 - 10.5 K/uL   RBC 4.64 4.22 - 5.81 MIL/uL   Hemoglobin 14.4 13.0 - 17.0 g/dL   HCT 44.0 10.2 - 72.5 %   MCV 88.6 80.0 - 100.0 fL   MCH 31.0 26.0 - 34.0 pg   MCHC 35.0 30.0 - 36.0 g/dL   RDW 36.6 44.0 - 34.7 %   Platelets 200 150 - 400 K/uL   nRBC 0.0 0.0 - 0.2 %    Comment: Performed at [facility], [address] [place], [place] [address]    CT ABDOMEN PELVIS W CONTRAST Result Date: 01/23/2024 CLINICAL DATA:  Lower abdominal and suprapubic pelvic pain for several days. EXAM: CT ABDOMEN AND PELVIS WITH CONTRAST TECHNIQUE: Multidetector CT imaging of the abdomen and pelvis was performed using the standard protocol following bolus administration of intravenous contrast. RADIATION DOSE  REDUCTION: This exam was performed according to the departmental dose-optimization program which includes automated exposure control, adjustment of the mA and/or kV according to patient size  and/or use of iterative reconstruction technique. CONTRAST:  80mL OMNIPAQUE  IOHEXOL  300 MG/ML  SOLN COMPARISON:  None Available. FINDINGS: Lower Chest: No acute findings. Hepatobiliary: No suspicious hepatic masses identified. A few tiny sub-centimeter cysts are noted in the left hepatic lobe. Tiny 2-3 mm gallstone seen. No evidence of cholecystitis or biliary ductal dilatation. Pancreas:  No mass or inflammatory changes. Spleen: Within normal limits in size and appearance. Adrenals/Urinary Tract: No suspicious masses identified. No evidence of ureteral calculi or hydronephrosis. Stomach/Bowel: Mild-to-moderate sigmoid diverticulitis seen with a small adjacent extraluminal air collection, consistent with micro perforation. No evidence of free intraperitoneal air, abscess or bowel obstruction. Vascular/Lymphatic: No pathologically enlarged lymph nodes. No acute vascular findings. Reproductive:  Mildly enlarged prostate. Other:  None. Musculoskeletal:  No suspicious bone lesions identified. IMPRESSION: Mild-to-moderate sigmoid diverticulitis, with microperforation. No evidence of abscess or bowel obstruction. Mildly enlarged prostate. Cholelithiasis. No radiographic evidence of cholecystitis. Electronically Signed   By: Marlyce Sine M.D.   On: 01/23/2024 14:19    Review of Systems  HENT:  Negative for ear discharge, ear pain, hearing loss and tinnitus.   Eyes:  Negative for photophobia and pain.  Respiratory:  Negative for cough and shortness of breath.   Cardiovascular:  Negative for chest pain.  Gastrointestinal:  Positive for abdominal pain and diarrhea. Negative for nausea and vomiting.  Genitourinary:  Negative for dysuria, flank pain, frequency and urgency.  Musculoskeletal:  Negative for back pain, myalgias and  neck pain.  Neurological:  Negative for dizziness and headaches.  Hematological:  Does not bruise/bleed easily.  Psychiatric/Behavioral:  The patient is not nervous/anxious.    Blood pressure 105/74, pulse 91, temperature 99.9 F (37.7 C), temperature source Oral, resp. rate 18, height 5\' 8"  (1.727 m), weight 74.4 kg, SpO2 97%. Physical Exam Constitutional:  WDWN in NAD, conversant, no obvious deformities; lying in bed comfortably Eyes:  Pupils equal, round; sclera anicteric; moist conjunctiva; no lid lag HENT:  Oral mucosa moist; good dentition  Neck:  No masses palpated, trachea midline; no thyromegaly Lungs:  CTA bilaterally; normal respiratory effort CV:  Regular rate and rhythm; no murmurs; extremities well-perfused with no edema Abd:  +bowel sounds, soft, tender in suprapubic region and LLQ; no palpable organomegaly; no palpable hernias Musc:  Unable to assess gait; no apparent clubbing or cyanosis in extremities Lymphatic:  No palpable cervical or axillary lymphadenopathy Skin:  Warm, dry; no sign of jaundice Psychiatric - alert and oriented x 4; calm mood and affect  Assessment/Plan: Sigmoid diverticulitis with microperforation No indications for emergent surgical intervention IV abx Bowel rest - ice chips  We discussed the plan to manage this problem nonoperatively.  We can hopefully avoid urgent or emergent surgery that may result in a temporary colostomy.  This is his first episode so it is unclear whether he will need to have elective resection.  Will continue to follow.  Do not advance his diet yet.  Kari Otto. Eli Grizzle, MD, [facility]  General Surgery    Rella Cardinal 01/24/2024, 8:55 AM

## 2024-01-24 NOTE — Plan of Care (Signed)
   Problem: Coping: Goal: Level of anxiety will decrease Outcome: Progressing   Problem: Pain Managment: Goal: General experience of comfort will improve and/or be controlled Outcome: Progressing   Problem: Safety: Goal: Ability to remain free from injury will improve Outcome: Progressing

## 2024-01-24 NOTE — Progress Notes (Signed)
   01/24/24 0833  TOC Brief Assessment  Insurance and Status Reviewed  Patient has primary care physician Yes  Home environment has been reviewed home  Prior level of function: independent  Prior/Current Home Services No current home services  Social Drivers of Health Review SDOH reviewed no interventions necessary  Readmission risk has been reviewed Yes  Transition of care needs no transition of care needs at this time

## 2024-01-25 DIAGNOSIS — K5732 Diverticulitis of large intestine without perforation or abscess without bleeding: Secondary | ICD-10-CM | POA: Diagnosis not present

## 2024-01-25 LAB — CBC
HCT: 41 % (ref 39.0–52.0)
Hemoglobin: 13.6 g/dL (ref 13.0–17.0)
MCH: 31 pg (ref 26.0–34.0)
MCHC: 33.2 g/dL (ref 30.0–36.0)
MCV: 93.4 fL (ref 80.0–100.0)
Platelets: 174 10*3/uL (ref 150–400)
RBC: 4.39 MIL/uL (ref 4.22–5.81)
RDW: 12.4 % (ref 11.5–15.5)
WBC: 12.7 10*3/uL — ABNORMAL HIGH (ref 4.0–10.5)
nRBC: 0 % (ref 0.0–0.2)

## 2024-01-25 LAB — BASIC METABOLIC PANEL WITH GFR
Anion gap: 10 (ref 5–15)
BUN: 20 mg/dL (ref 6–20)
CO2: 22 mmol/L (ref 22–32)
Calcium: 8.6 mg/dL — ABNORMAL LOW (ref 8.9–10.3)
Chloride: 104 mmol/L (ref 98–111)
Creatinine, Ser: 1.11 mg/dL (ref 0.61–1.24)
GFR, Estimated: >60 mL/min (ref >60)
Glucose, Bld: 78 mg/dL (ref 70–99)
Potassium: 4.1 mmol/L (ref 3.5–5.1)
Sodium: 136 mmol/L (ref 135–145)

## 2024-01-25 NOTE — Progress Notes (Signed)
 Subjective/Chief Complaint: Feels much better than yesterday Still requiring occasional pain medication for suprapubic tenderness Passing flatus, no BM The pain comes in waves, not persistent   Objective: Vital signs in last 24 hours: Temp:  [97.6 F (36.4 C)-100.1 F (37.8 C)] 100.1 F (37.8 C) (05/04 0449) Pulse Rate:  [84-95] 85 (05/04 0449) Resp:  [18-20] 20 (05/04 0449) BP: (117-147)/(66-102) 144/83 (05/04 0449) SpO2:  [96 %-97 %] 96 % (05/04 0449) Last BM Date : 01/23/24  Intake/Output from previous day: 05/03 0701 - 05/04 0700 In: 928.5 [I.V.:735.2; IV Piggyback:193.3] Out: -  Intake/Output this shift: No intake/output data recorded.  WDWN in NAD Abd - soft, tender in suprapubic region - improved from 01/24/24  Lab Results:  Recent Labs    01/23/24 1210 01/24/24 0821  WBC 10.4 16.0*  HGB 14.4 13.8  HCT 41.1 41.4  PLT 200 173   BMET Recent Labs    01/23/24 1210 01/24/24 0821  NA 137 135  K 3.6 3.9  CL 100 102  CO2 23 22  GLUCOSE 108* 92  BUN 12 16  CREATININE 1.06 1.07  CALCIUM  9.3 8.4*     Studies/Results: CT ABDOMEN PELVIS W CONTRAST Result Date: 01/23/2024 CLINICAL DATA:  Lower abdominal and suprapubic pelvic pain for several days. EXAM: CT ABDOMEN AND PELVIS WITH CONTRAST TECHNIQUE: Multidetector CT imaging of the abdomen and pelvis was performed using the standard protocol following bolus administration of intravenous contrast. RADIATION DOSE REDUCTION: This exam was performed according to the departmental dose-optimization program which includes automated exposure control, adjustment of the mA and/or kV according to patient size and/or use of iterative reconstruction technique. CONTRAST:  80mL OMNIPAQUE  IOHEXOL  300 MG/ML  SOLN COMPARISON:  None Available. FINDINGS: Lower Chest: No acute findings. Hepatobiliary: No suspicious hepatic masses identified. A few tiny sub-centimeter cysts are noted in the left hepatic lobe. Tiny 2-3 mm gallstone seen.  No evidence of cholecystitis or biliary ductal dilatation. Pancreas:  No mass or inflammatory changes. Spleen: Within normal limits in size and appearance. Adrenals/Urinary Tract: No suspicious masses identified. No evidence of ureteral calculi or hydronephrosis. Stomach/Bowel: Mild-to-moderate sigmoid diverticulitis seen with a small adjacent extraluminal air collection, consistent with micro perforation. No evidence of free intraperitoneal air, abscess or bowel obstruction. Vascular/Lymphatic: No pathologically enlarged lymph nodes. No acute vascular findings. Reproductive:  Mildly enlarged prostate. Other:  None. Musculoskeletal:  No suspicious bone lesions identified. IMPRESSION: Mild-to-moderate sigmoid diverticulitis, with microperforation. No evidence of abscess or bowel obstruction. Mildly enlarged prostate. Cholelithiasis. No radiographic evidence of cholecystitis. Electronically Signed   By: Marlyce Sine M.D.   On: 01/23/2024 14:19    Anti-infectives: Anti-infectives (From admission, onward)    Start     Dose/Rate Route Frequency Ordered Stop   01/24/24 1430  cefTRIAXone  (ROCEPHIN ) 2 g in sodium chloride  0.9 % 100 mL IVPB        2 g 200 mL/hr over 30 Minutes Intravenous Every 24 hours 01/23/24 1853     01/23/24 2200  metroNIDAZOLE  (FLAGYL ) IVPB 500 mg        500 mg 100 mL/hr over 60 Minutes Intravenous Every 12 hours 01/23/24 1853     01/23/24 1430  cefTRIAXone  (ROCEPHIN ) 2 g in sodium chloride  0.9 % 100 mL IVPB       Placed in "And" Linked Group   2 g 200 mL/hr over 30 Minutes Intravenous  Once 01/23/24 1426 01/23/24 1513   01/23/24 1430  metroNIDAZOLE  (FLAGYL ) IVPB 500 mg  Placed in "And" Linked Group   500 mg 100 mL/hr over 60 Minutes Intravenous  Once 01/23/24 1426 01/23/24 1654       Assessment/Plan: Sigmoid diverticulitis with microperforation No indications for emergent surgical intervention IV abx May advance to CLD today Recheck labs to monitor WBC   We will  attempt to manage this problem nonoperatively.  We can hopefully avoid urgent or emergent surgery that may result in a temporary colostomy.  This is his first episode so it is unclear whether he will need to have elective resection.  Will continue to follow.    LOS: 1 day    Rella Cardinal 01/25/2024

## 2024-01-25 NOTE — Plan of Care (Signed)

## 2024-01-25 NOTE — Progress Notes (Signed)
  Progress Note   Patient: Darren Scott ZOX:[id number] DOB: 1971/01/19 DOA: 01/23/2024     1 DOS: the patient was seen and examined on 01/25/2024 at 11:22AM      Brief hospital course: 53 y.o. M with HTN who presented with abdominal pain. CT abdomen showed sigmoid diverticulitis with microperforation.      Assessment and Plan: Acute sigmoid diverticulitis - Per general surgery  Hyperlipidemia - Continue Crestor   Essential hypertension Blood pressure mostly controlled - Continue amlodipine , metoprolol          Subjective: Still with discomfort in the abdomen, no fever     Physical Exam: BP (!) 144/83 (BP Location: Right Arm)   Pulse 85   Temp 100.1 F (37.8 C) (Oral)   Resp 20   Ht 5\' 8"  (1.727 m)   Wt 74.4 kg   SpO2 96%   BMI 24.94 kg/m   Adult male, lying in bed, interactive and appropriate Abdomen soft, tenderness bilateral lower quadrants, unchanged from yesterday  Data Reviewed: White blood cell count down to 12 Basic metabolic panel normal  Family Communication: None present    Disposition: Status is: Inpatient Per surgery        Author: Ephriam Hashimoto, MD 01/25/2024 5:07 PM  For on call review [email or web address].

## 2024-01-26 DIAGNOSIS — K5720 Diverticulitis of large intestine with perforation and abscess without bleeding: Secondary | ICD-10-CM | POA: Diagnosis not present

## 2024-01-26 DIAGNOSIS — K5732 Diverticulitis of large intestine without perforation or abscess without bleeding: Secondary | ICD-10-CM | POA: Diagnosis not present

## 2024-01-26 LAB — CBC
HCT: 43.2 % (ref 39.0–52.0)
Hemoglobin: 14.5 g/dL (ref 13.0–17.0)
MCH: 31 pg (ref 26.0–34.0)
MCHC: 33.6 g/dL (ref 30.0–36.0)
MCV: 92.5 fL (ref 80.0–100.0)
Platelets: 181 10*3/uL (ref 150–400)
RBC: 4.67 MIL/uL (ref 4.22–5.81)
RDW: 12.4 % (ref 11.5–15.5)
WBC: 10.5 10*3/uL (ref 4.0–10.5)
nRBC: 0 % (ref 0.0–0.2)

## 2024-01-26 NOTE — Plan of Care (Signed)

## 2024-01-26 NOTE — Progress Notes (Signed)
  Progress Note   Patient: Darren Scott ZOX:[id number] DOB: Dec 19, 1970 DOA: 01/23/2024     2 DOS: the patient was seen and examined on 01/26/2024        Brief hospital course: 53 y.o. M with HTN who presented with abdominal pain. CT abdomen showed sigmoid diverticulitis with microperforation.      Assessment and Plan: Acute sigmoid diverticulitis - Per general surgery - Continue antibiotics  Hypertension Blood pressure controlled - Continue amlodipine   Hyperlipidemia - Continue Crestor         Subjective: Abdominal pain is improving, still having some colicky sharp pains periodically, no fever, bowel movements are improving,     Physical Exam: BP (!) 124/93 (BP Location: Left Arm)   Pulse 81   Temp 98.8 F (37.1 C) (Oral)   Resp 16   Ht 5\' 8"  (1.727 m)   Wt 74.4 kg   SpO2 99%   BMI 24.94 kg/m   Abdomen soft, no tenderness palpation Mentation normal, ambulating in the room comfortably  Data Reviewed: Discussed with general surgery CBC shows normalized white count  Family Communication:     Disposition: Status is: Inpatient         Author: Ephriam Hashimoto, MD 01/26/2024 11:55 AM  For on call review [email or web address].

## 2024-01-26 NOTE — Progress Notes (Signed)
 Subjective: CC: Reports improved abdominal pain. Still has waves of suprapubic abdominal pain to a 1/10 that is controlled w/ tylenol . Does not seem to be related to anything in particular. May be worse before bm's. Tolerating cld without n/v. Passing flatus. Non-bloody liquid bm yesterday/today.   No personal or fhx of colon ca or IBD.  No previous episodes of diverticulitis  His last colonoscopy was December 2022 by Dr. Willy Harvest and was normal.   Afebrile. No tachycardia or hypotension. WBC wnl.    Objective: Vital signs in last 24 hours: Temp:  [98.8 F (37.1 C)-99.4 F (37.4 C)] 98.8 F (37.1 C) (05/05 0446) Pulse Rate:  [73-81] 81 (05/05 0932) Resp:  [16-18] 16 (05/05 0932) BP: (119-134)/(79-93) 124/93 (05/05 0932) SpO2:  [96 %-99 %] 99 % (05/05 0932) Last BM Date : 01/25/24  Intake/Output from previous day: No intake/output data recorded. Intake/Output this shift: No intake/output data recorded.  PE: Gen:  Alert, NAD, pleasant Abd: Soft, ND, mild suprapubic ttp without rigidity or guarding. +BS  Lab Results:  Recent Labs    01/25/24 0807 01/26/24 0506  WBC 12.7* 10.5  HGB 13.6 14.5  HCT 41.0 43.2  PLT 174 181   BMET Recent Labs    01/24/24 0821 01/25/24 0807  NA 135 136  K 3.9 4.1  CL 102 104  CO2 22 22  GLUCOSE 92 78  BUN 16 20  CREATININE 1.07 1.11  CALCIUM  8.4* 8.6*   PT/INR No results for input(s): "LABPROT", "INR" in the last 72 hours. CMP     Component Value Date/Time   NA 136 01/25/2024 0807   NA 139 10/07/2019 1311   K 4.1 01/25/2024 0807   CL 104 01/25/2024 0807   CO2 22 01/25/2024 0807   GLUCOSE 78 01/25/2024 0807   BUN 20 01/25/2024 0807   BUN 15 10/07/2019 1311   CREATININE 1.11 01/25/2024 0807   CREATININE 1.20 06/29/2021 1628   CALCIUM  8.6 (L) 01/25/2024 0807   PROT 7.1 01/23/2024 1210   ALBUMIN 4.4 01/23/2024 1210   AST 24 01/23/2024 1210   ALT 29 01/23/2024 1210   ALKPHOS 70 01/23/2024 1210   BILITOT 0.7  01/23/2024 1210   GFRNONAA >60 01/25/2024 0807   GFRAA 91 10/07/2019 1311   Lipase     Component Value Date/Time   LIPASE 34 01/23/2024 1210    Studies/Results: No results found.  Anti-infectives: Anti-infectives (From admission, onward)    Start     Dose/Rate Route Frequency Ordered Stop   01/24/24 1430  cefTRIAXone  (ROCEPHIN ) 2 g in sodium chloride  0.9 % 100 mL IVPB        2 g 200 mL/hr over 30 Minutes Intravenous Every 24 hours 01/23/24 1853     01/23/24 2200  metroNIDAZOLE  (FLAGYL ) IVPB 500 mg        500 mg 100 mL/hr over 60 Minutes Intravenous Every 12 hours 01/23/24 1853     01/23/24 1430  cefTRIAXone  (ROCEPHIN ) 2 g in sodium chloride  0.9 % 100 mL IVPB       Placed in "And" Linked Group   2 g 200 mL/hr over 30 Minutes Intravenous  Once 01/23/24 1426 01/23/24 1513   01/23/24 1430  metroNIDAZOLE  (FLAGYL ) IVPB 500 mg       Placed in "And" Linked Group   500 mg 100 mL/hr over 60 Minutes Intravenous  Once 01/23/24 1426 01/23/24 1654        Assessment/Plan Sigmoid diverticulitis with microperf - CT  w/ above. No abscess - Patient is hemodynamically stable without fever, tachycardia or hypotension. WBC normalized. No peritonitis on exam. No current indication for emergency surgery - Cont IV antibiotics - Adv to FLD. ADAT - Hopefully patient will improve with conservative treatment.  If patient fails to improve they may require repeating imaging, drain placement, or surgical intervention resulting in a colectomy/colostomy.  This was discussed with the patient. - If patient improves with conservative therapies would recommend colonoscopy in ~6-8 weeks.    - We will follow with you. If continues to improve, may be able to be d/c'd on oral abx in the next 24 hours. Message sent to primary team.   FEN - FLD, ADAT to soft diet. IVF VTE - SCDs, okay for chem ppx from a general surgery standpoint ID - Rocephin /Flagyl   I reviewed nursing notes, hospitalist notes, last 24 h  vitals and pain scores, last 48 h intake and output, last 24 h labs and trends, and last 24 h imaging results.   LOS: 2 days    Darren Scott, [facility] Surgery 01/26/2024, 11:14 AM Please see Amion for pager number during day hours 7:00am-4:30pm

## 2024-01-27 ENCOUNTER — Other Ambulatory Visit (HOSPITAL_COMMUNITY): Payer: Self-pay

## 2024-01-27 DIAGNOSIS — K5732 Diverticulitis of large intestine without perforation or abscess without bleeding: Secondary | ICD-10-CM | POA: Diagnosis not present

## 2024-01-27 MED ORDER — AMOXICILLIN-POT CLAVULANATE 875-125 MG PO TABS
1.0000 | ORAL_TABLET | Freq: Two times a day (BID) | ORAL | 0 refills | Status: DC
  Filled 2024-01-27: qty 14, 7d supply, fill #0

## 2024-01-27 NOTE — Progress Notes (Signed)
 Subjective: CC: Reports improved abdominal pain. Still has waves of suprapubic abdominal pain to a 0.5/10 brought on by movement that is controlled w/ tylenol  and relieved w/ rest. Tolerating reg diet (ate a chicken club last night) without n/v. Passing flatus. Non-bloody liquid bm yesterday/today.   No personal or fhx of colon ca or IBD.  No previous episodes of diverticulitis  His last colonoscopy was December 2022 by Dr. Willy Harvest and was normal.   Afebrile. No tachycardia or hypotension. WBC wnl on last check.   Objective: Vital signs in last 24 hours: Temp:  [98.1 F (36.7 C)-98.6 F (37 C)] 98.1 F (36.7 C) (05/06 0522) Pulse Rate:  [78-92] 78 (05/06 0522) Resp:  [16-20] 20 (05/06 0522) BP: (111-138)/(71-93) 138/88 (05/06 0522) SpO2:  [96 %-99 %] 97 % (05/06 0522) Last BM Date : 01/27/24  Intake/Output from previous day: No intake/output data recorded. Intake/Output this shift: No intake/output data recorded.  PE: Gen:  Alert, NAD, pleasant Abd: Soft, ND, improve mild suprapubic ttp without rigidity or guarding. +BS  Lab Results:  Recent Labs    01/25/24 0807 01/26/24 0506  WBC 12.7* 10.5  HGB 13.6 14.5  HCT 41.0 43.2  PLT 174 181   BMET Recent Labs    01/24/24 0821 01/25/24 0807  NA 135 136  K 3.9 4.1  CL 102 104  CO2 22 22  GLUCOSE 92 78  BUN 16 20  CREATININE 1.07 1.11  CALCIUM  8.4* 8.6*   PT/INR No results for input(s): "LABPROT", "INR" in the last 72 hours. CMP     Component Value Date/Time   NA 136 01/25/2024 0807   NA 139 10/07/2019 1311   K 4.1 01/25/2024 0807   CL 104 01/25/2024 0807   CO2 22 01/25/2024 0807   GLUCOSE 78 01/25/2024 0807   BUN 20 01/25/2024 0807   BUN 15 10/07/2019 1311   CREATININE 1.11 01/25/2024 0807   CREATININE 1.20 06/29/2021 1628   CALCIUM  8.6 (L) 01/25/2024 0807   PROT 7.1 01/23/2024 1210   ALBUMIN 4.4 01/23/2024 1210   AST 24 01/23/2024 1210   ALT 29 01/23/2024 1210   ALKPHOS 70 01/23/2024 1210    BILITOT 0.7 01/23/2024 1210   GFRNONAA >60 01/25/2024 0807   GFRAA 91 10/07/2019 1311   Lipase     Component Value Date/Time   LIPASE 34 01/23/2024 1210    Studies/Results: No results found.  Anti-infectives: Anti-infectives (From admission, onward)    Start     Dose/Rate Route Frequency Ordered Stop   01/24/24 1430  cefTRIAXone  (ROCEPHIN ) 2 g in sodium chloride  0.9 % 100 mL IVPB        2 g 200 mL/hr over 30 Minutes Intravenous Every 24 hours 01/23/24 1853     01/23/24 2200  metroNIDAZOLE  (FLAGYL ) IVPB 500 mg        500 mg 100 mL/hr over 60 Minutes Intravenous Every 12 hours 01/23/24 1853     01/23/24 1430  cefTRIAXone  (ROCEPHIN ) 2 g in sodium chloride  0.9 % 100 mL IVPB       Placed in "And" Linked Group   2 g 200 mL/hr over 30 Minutes Intravenous  Once 01/23/24 1426 01/23/24 1513   01/23/24 1430  metroNIDAZOLE  (FLAGYL ) IVPB 500 mg       Placed in "And" Linked Group   500 mg 100 mL/hr over 60 Minutes Intravenous  Once 01/23/24 1426 01/23/24 1654        Assessment/Plan Sigmoid diverticulitis with  microperf - CT w/ above. No abscess - Patient is hemodynamically stable without fever, tachycardia or hypotension. WBC normalized. No peritonitis on exam. No current indication for emergency surgery - Appears to be improving with conservative therapies. Okay for d/c from our standpoint on 7-10d of oral abx. Discussed dietary recommendations at d/c with patient. Would recommend colonoscopy in ~6-8 weeks. I have messaged [facility] with recommendations.   FEN - Reg diet. IVF VTE - SCDs, okay for chem ppx from a general surgery standpoint ID - Rocephin /Flagyl   I reviewed nursing notes, hospitalist notes, last 24 h vitals and pain scores, last 48 h intake and output, last 24 h labs and trends, and last 24 h imaging results.   LOS: 3 days    Delton Filbert, [facility] 01/27/2024, 8:06 AM Please see Amion for pager number during day hours 7:00am-4:30pm

## 2024-01-27 NOTE — Discharge Summary (Signed)
 Physician Discharge Summary   Patient: Darren Scott MRN: [id number] DOB: 29-Jul-1971  Admit date:     01/23/2024  Discharge date: 01/27/24  Discharge Physician: Ephriam Hashimoto   PCP: Dorrene Gaucher, NP     Recommendations at discharge:  Follow up with PCP Dorrene Gaucher in 1 week for diverticulitis Follow up with Dr. Willy Harvest when able for follow up colonoscopy     Discharge Diagnoses: Principal Problem:   Sigmoid diverticulitis Other Hospital problems:   Hypertension   Hyperlipidemia     Hospital Course: 53 y.o. M with HTN who presented with lower abdominal pain. CT abdomen showed mild to moderate sigmoid diverticulitis with microperforation no evidence of abscess or obstruction.   Diverticulitis Patient was admitted and started on Rocephin  and Flagyl .  His abdominal pain gradually improved, he was tolerating solid diet, bowel movements regular.  Transition to oral antibiotics with Augmentin  to complete 10 days.  Last colonoscopy was 2022, recommend colonoscopy in 6 to 8 weeks.          The [place]  Controlled Substances Registry was reviewed for this patient prior to discharge.  Consultants: General Surgery Procedures performed: CT abdomen and pelvis Disposition: Home Diet recommendation: High-fiber   DISCHARGE MEDICATION: Allergies as of 01/27/2024   No Known Allergies      Medication List     TAKE these medications    amLODipine  10 MG tablet Commonly known as: NORVASC  TAKE 1 TABLET BY MOUTH EVERY DAY   amoxicillin -clavulanate 875-125 MG tablet Commonly known as: AUGMENTIN  Take 1 tablet by mouth 2 (two) times daily.   aspirin  EC 81 MG tablet Take 1 tablet (81 mg total) by mouth daily.   esomeprazole 20 MG capsule Commonly known as: NEXIUM Take 20 mg by mouth daily at 12 noon.   metoprolol  succinate 50 MG 24 hr tablet Commonly known as: TOPROL -XL Take 1 tablet (50 mg total) by mouth daily.   rosuvastatin  20 MG  tablet Commonly known as: CRESTOR  TAKE 1 TABLET BY MOUTH EVERY DAY        Follow-up Information     Dorrene Gaucher, NP. Schedule an appointment as soon as possible for a visit in 1 week(s).   Specialty: Internal Medicine Contact information: [address] [place] [address] [phone]         Kenney Peacemaker, MD Follow up.   Specialty: Gastroenterology Why: Within 2 months for colonoscopy Contact information: [address] [place] [address] [phone]                 Discharge Instructions     Discharge instructions   Complete by: As directed    **IMPORTANT DISCHARGE INSTRUCTIONS**   From Dr. Darlyn Eke: You were admitted for diverticulitis  YOu were treated here with antibiotics, and you should finish the course with 7 more days of the antibiotic Augmentin   Take Augmentin  875-125 mg twice daily for 7 more days  Go see your primary doctor in 1 week  Call Dr. Jadene Maxwell office (that is your GI doctor) so that you can get a follow up colonoscopy in 6-8 weeks   Eat a high fiber diet as we discussed  Return for fever, vomiting, or severe and obvious worsening of the abdominal pain   Increase activity slowly   Complete by: As directed        Discharge Exam: Filed Weights   01/23/24 1141  Weight: 74.4 kg    General: Pt is alert, awake, not in  acute distress Cardiovascular: RRR, nl S1-S2, no murmurs appreciated.   No LE edema.   Respiratory: Normal respiratory rate and rhythm.  CTAB without rales or wheezes. Abdominal: Abdomen soft and non-tender.  No distension or HSM.   Neuro/Psych: Strength symmetric in upper and lower extremities.  Judgment and insight appear normal.   Condition at discharge: good  The results of significant diagnostics from this hospitalization (including imaging, microbiology, ancillary and laboratory) are listed below for reference.   Imaging Studies: CT ABDOMEN PELVIS W CONTRAST Result  Date: 01/23/2024 CLINICAL DATA:  Lower abdominal and suprapubic pelvic pain for several days. EXAM: CT ABDOMEN AND PELVIS WITH CONTRAST TECHNIQUE: Multidetector CT imaging of the abdomen and pelvis was performed using the standard protocol following bolus administration of intravenous contrast. RADIATION DOSE REDUCTION: This exam was performed according to the departmental dose-optimization program which includes automated exposure control, adjustment of the mA and/or kV according to patient size and/or use of iterative reconstruction technique. CONTRAST:  80mL OMNIPAQUE  IOHEXOL  300 MG/ML  SOLN COMPARISON:  None Available. FINDINGS: Lower Chest: No acute findings. Hepatobiliary: No suspicious hepatic masses identified. A few tiny sub-centimeter cysts are noted in the left hepatic lobe. Tiny 2-3 mm gallstone seen. No evidence of cholecystitis or biliary ductal dilatation. Pancreas:  No mass or inflammatory changes. Spleen: Within normal limits in size and appearance. Adrenals/Urinary Tract: No suspicious masses identified. No evidence of ureteral calculi or hydronephrosis. Stomach/Bowel: Mild-to-moderate sigmoid diverticulitis seen with a small adjacent extraluminal air collection, consistent with micro perforation. No evidence of free intraperitoneal air, abscess or bowel obstruction. Vascular/Lymphatic: No pathologically enlarged lymph nodes. No acute vascular findings. Reproductive:  Mildly enlarged prostate. Other:  None. Musculoskeletal:  No suspicious bone lesions identified. IMPRESSION: Mild-to-moderate sigmoid diverticulitis, with microperforation. No evidence of abscess or bowel obstruction. Mildly enlarged prostate. Cholelithiasis. No radiographic evidence of cholecystitis. Electronically Signed   By: Marlyce Sine M.D.   On: 01/23/2024 14:19    Microbiology: Results for orders placed or performed during the hospital encounter of 12/15/13  Culture, blood (routine x 2)     Status: None   Collection Time:  12/15/13  2:05 PM   Specimen: Blood  Result Value Ref Range Status   Specimen Description BLOOD LEFT [facility]  Final   Special Requests BOTTLES DRAWN AEROBIC AND ANAEROBIC [facility] EACH  Final   Culture  Setup Time   Final    12/15/2013 18:18 Performed at [facility]   Culture   Final    NO GROWTH 5 DAYS Performed at [facility]   Report Status 12/21/2013 FINAL  Final  Culture, blood (routine x 2)     Status: None   Collection Time: 12/15/13  2:10 PM   Specimen: Blood  Result Value Ref Range Status   Specimen Description BLOOD L HAND  Final   Special Requests BOTTLES DRAWN AEROBIC AND ANAEROBIC [facility] EACH  Final   Culture  Setup Time   Final    12/15/2013 18:18 Performed at [facility]   Culture   Final    NO GROWTH 5 DAYS Performed at [facility]   Report Status 12/21/2013 FINAL  Final  Rapid strep screen     Status: Abnormal   Collection Time: 12/15/13  2:10 PM   Specimen: Throat  Result Value Ref Range Status   Streptococcus, Group A Screen (Direct) POSITIVE (A) NEGATIVE Final    Labs: CBC: Recent Labs  Lab 01/23/24 1210 01/24/24 5621 01/25/24 0807 01/26/24 3086  WBC 10.4 16.0* 12.7* 10.5  HGB 14.4 13.8 13.6 14.5  HCT 41.1 41.4 41.0 43.2  MCV 88.6 92.6 93.4 92.5  PLT 200 173 174 181   Basic Metabolic Panel: Recent Labs  Lab 01/23/24 1210 01/24/24 0821 01/25/24 0807  NA 137 135 136  K 3.6 3.9 4.1  CL 100 102 104  CO2 23 22 22   GLUCOSE 108* 92 78  BUN 12 16 20   CREATININE 1.06 1.07 1.11  CALCIUM  9.3 8.4* 8.6*   Liver Function Tests: Recent Labs  Lab 01/23/24 1210  AST 24  ALT 29  ALKPHOS 70  BILITOT 0.7  PROT 7.1  ALBUMIN 4.4   CBG: No results for input(s): "GLUCAP" in the last 168 hours.  Discharge time spent: approximately 25 minutes spent on discharge counseling, evaluation of patient on day of discharge, and coordination of discharge planning with nursing, social work, pharmacy and case  management  Signed: Ephriam Hashimoto, MD [facility] 01/27/2024

## 2024-01-28 ENCOUNTER — Ambulatory Visit: Admitting: Family

## 2024-01-28 VITALS — BP 120/76 | HR 84 | Temp 97.8°F | Resp 16 | Ht 67.0 in | Wt 157.0 lb

## 2024-01-28 DIAGNOSIS — E785 Hyperlipidemia, unspecified: Secondary | ICD-10-CM

## 2024-01-28 DIAGNOSIS — K219 Gastro-esophageal reflux disease without esophagitis: Secondary | ICD-10-CM

## 2024-01-28 DIAGNOSIS — K5732 Diverticulitis of large intestine without perforation or abscess without bleeding: Secondary | ICD-10-CM

## 2024-01-28 DIAGNOSIS — Z8679 Personal history of other diseases of the circulatory system: Secondary | ICD-10-CM | POA: Diagnosis not present

## 2024-01-28 MED ORDER — METOPROLOL SUCCINATE ER 25 MG PO TB24: 25.0000 mg | ORAL_TABLET | Freq: Every day | ORAL | 3 refills | Status: AC

## 2024-01-28 MED ORDER — AMLODIPINE BESYLATE 10 MG PO TABS: 10.0000 mg | ORAL_TABLET | Freq: Every day | ORAL | 0 refills | Status: DC

## 2024-01-28 NOTE — Assessment & Plan Note (Signed)
Stable on otc nexium.  Continue same.  

## 2024-01-28 NOTE — Progress Notes (Signed)
 Subjective:     Patient ID: Darren Scott, male    DOB: Apr 05, 1971, 53 y.o.   MRN: [id number]  Chief Complaint  Patient presents with   Hypertension    Here for follow up, out of metoprolol  x 3 weeks    HPI  Discussed the use of AI scribe software for clinical note transcription with the patient, who gave verbal consent to proceed.  History of Present Illness   Darren Abaya "Darren Scott" is a 53 year old male who presents following recent hospitalization for diverticulitis.  He experienced his first episode of sigmoid diverticulitis with a micro perforation, resulting in severe abdominal pain. He was discharged with a 7-day course of Augmentin , which he started last night. Pain has improved, but diarrhea persists, likely due to antibiotics. He is cautious with his diet to avoid exacerbating his condition. His abdominal pain is much improved.  He has hypertension and was previously on metoprolol , (which he has not taken for about three weeks due to a lapse in prescription refill) and amlodipine  10 mg . His blood pressure remains stable on amlodipine , with a 30-day supply remaining. He is mindful of sodium intake, especially at work, and opts for [employer].  He manages GERD with Nexium, reduced from two to one pill daily, without experiencing heartburn. He occasionally experiences a 'little sharp pain' in the abdomen but no regular stools, describing bowel movements as diarrhea type.    Health Maintenance Due  Topic Date Due   Pneumococcal Vaccine 54-72 Years old (1 of 2 - PCV) Never done   COVID-19 Vaccine (1 - 2024-25 season) Never done   DTaP/Tdap/Td (2 - Td or Tdap) 12/30/2023    Past Medical History:  Diagnosis Date   Acute bronchitis 08/16/2014   Acute medial meniscal tear 02/25/2022   Arthritis    LEFT knee   Atypical chest pain 02/20/2018   Dyslipidemia 02/20/2018   Frequent headaches    uses PRN meds   GERD (gastroesophageal reflux disease)    on  meds   History of hypertension 12/29/2013   Hyperglycemia 12/29/2013   Hyperlipidemia    Hypertension    on meds   Other and unspecified hyperlipidemia 12/29/2013   on meds   Routine general medical examination at a health care facility 12/29/2013   Seasonal allergies     Past Surgical History:  Procedure Laterality Date   KNEE ARTHROSCOPY Bilateral 04/25/2022   KNEE SURGERY Left 2007   WISDOM TOOTH EXTRACTION      Family History  Problem Relation Age of Onset   Heart attack Father        died in his 16's, smoker   Cancer Neg Hx    Diabetes Neg Hx    Colon cancer Neg Hx    Colon polyps Neg Hx    Esophageal cancer Neg Hx    Rectal cancer Neg Hx    Stomach cancer Neg Hx     Social History   Socioeconomic History   Marital status: Single    Spouse name: Not on file   Number of children: Not on file   Years of education: Not on file   Highest education level: 8th grade  Occupational History   Not on file  Tobacco Use   Smoking status: Never   Smokeless tobacco: Current    Types: Chew  Vaping Use   Vaping status: Never Used  Substance and Sexual Activity   Alcohol use: Yes    Alcohol/week: 18.0 standard drinks  of alcohol    Types: 18 Cans of beer per week   Drug use: No   Sexual activity: Not on file  Other Topics Concern   Not on file  Social History Narrative   3 children 58 yr old son, 79 yr old daughter, son age 63    Separated, has joint custody.  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"    Completed 8th grade   Enjoys- hunting/fishing golfing   Left handed   Social Drivers of Health   Financial Resource Strain: Medium Risk (01/27/2024)   Overall Financial Resource Strain (CARDIA)    Difficulty of Paying Living Expenses: Somewhat hard  Food Insecurity: Food Insecurity Present (01/27/2024)   Hunger Vital Sign    Worried About Running Out of Food in the Last Year: Sometimes true    Ran Out  of Food in the Last Year: Sometimes true  Transportation Needs: No Transportation Needs (01/27/2024)   PRAPARE - Administrator, Civil Service (Medical): No    Lack of Transportation (Non-Medical): No  Physical Activity: Sufficiently Active (01/27/2024)   Exercise Vital Sign    Days of Exercise per Week: 7 days    Minutes of Exercise per Session: 150+ min  Stress: No Stress Concern Present (01/27/2024)   [employer] of Occupational Health - Occupational Stress Questionnaire    Feeling of Stress : Not at all  Social Connections: Socially Isolated (01/27/2024)   Social Connection and Isolation Panel [NHANES]    Frequency of Communication with Friends and Family: More than three times a week    Frequency of Social Gatherings with Friends and Family: Once a week    Attends Religious Services: Never    Database administrator or Organizations: No    Attends Engineer, structural: Not on file    Marital Status: Divorced  Intimate Partner Violence: Not At Risk (01/24/2024)   Humiliation, Afraid, Rape, and Kick questionnaire    Fear of Current or Ex-Partner: No    Emotionally Abused: No    Physically Abused: No    Sexually Abused: No    Outpatient Medications Prior to Visit  Medication Sig Dispense Refill   amoxicillin -clavulanate (AUGMENTIN ) 875-125 MG tablet Take 1 tablet by mouth 2 (two) times daily. 14 tablet 0   aspirin  EC 81 MG tablet Take 1 tablet (81 mg total) by mouth daily. 90 tablet 3   esomeprazole (NEXIUM) 20 MG capsule Take 20 mg by mouth daily at 12 noon.     rosuvastatin  (CRESTOR ) 20 MG tablet TAKE 1 TABLET BY MOUTH EVERY DAY 90 tablet 0   amLODipine  (NORVASC ) 10 MG tablet TAKE 1 TABLET BY MOUTH EVERY DAY 90 tablet 1   metoprolol  succinate (TOPROL -XL) 50 MG 24 hr tablet Take 1 tablet (50 mg total) by mouth daily. (Patient not taking: Reported on 01/28/2024) 30 tablet 0   No facility-administered medications prior to visit.    No Known Allergies  ROS See  HPI    Objective:    Physical Exam Constitutional:      General: He is not in acute distress.    Appearance: He is well-developed.  HENT:     Head: Normocephalic and atraumatic.  Cardiovascular:     Rate and Rhythm: Normal rate and regular rhythm.     Heart sounds: No murmur heard. Pulmonary:     Effort: Pulmonary effort is normal. No respiratory distress.  Breath sounds: Normal breath sounds. No wheezing or rales.  Abdominal:     General: Bowel sounds are decreased.     Palpations: Abdomen is soft.     Tenderness: There is abdominal tenderness in the suprapubic area. There is no guarding.  Skin:    General: Skin is warm and dry.  Neurological:     Mental Status: He is alert and oriented to person, place, and time.  Psychiatric:        Behavior: Behavior normal.        Thought Content: Thought content normal.      BP 120/76 (BP Location: Right Arm, Patient Position: Sitting, Cuff Size: Normal)   Pulse 84   Temp 97.8 F (36.6 C) (Oral)   Resp 16   Ht 5\' 7"  (1.702 m)   Wt 157 lb (71.2 kg)   SpO2 98%   BMI 24.59 kg/m  Wt Readings from Last 3 Encounters:  01/28/24 157 lb (71.2 kg)  01/23/24 164 lb (74.4 kg)  07/03/22 176 lb (79.8 kg)       Assessment & Plan:   Problem List Items Addressed This Visit       Unprioritized   Sigmoid diverticulitis - Primary   Clinically improved.  Recommended daily probiotic for gut health. Still having some loose stools but on augmentin - monitor. Complete antibiotics and arrange follow up colo with GI in 6-8 weeks.       Relevant Orders   Ambulatory referral to Gastroenterology   History of hypertension   BP stable on amlodipine .       Relevant Medications   metoprolol  succinate (TOPROL -XL) 25 MG 24 hr tablet   amLODipine  (NORVASC ) 10 MG tablet   Gastroesophageal reflux disease   Stable on otc nexium. Continue same.       Dyslipidemia   Lab Results  Component Value Date   CHOL 167 01/01/2022   HDL 38 (L)  01/01/2022   LDLCALC 93 01/01/2022   LDLDIRECT 124.0 08/10/2019   TRIG 212 (H) 01/01/2022   CHOLHDL 4.4 01/01/2022  Continue crestor , update lipid panel. He does have hx of CAD and was previously on toprol  xl 50mg . Will restart at 25 mg xl daily.       Relevant Orders   Lipid panel    I have discontinued Darren Scott "Jeff"'s metoprolol  succinate. I have also changed his amLODipine . Additionally, I am having him start on metoprolol  succinate. Lastly, I am having him maintain his aspirin  EC, esomeprazole, rosuvastatin , and amoxicillin -clavulanate.  Meds ordered this encounter  Medications   metoprolol  succinate (TOPROL -XL) 25 MG 24 hr tablet    Sig: Take 1 tablet (25 mg total) by mouth daily.    Dispense:  90 tablet    Refill:  3    Supervising Provider:   Randie Bustle A [[id number]]   amLODipine  (NORVASC ) 10 MG tablet    Sig: Take 1 tablet (10 mg total) by mouth daily.    Dispense:  90 tablet    Refill:  0    Please place on file    Supervising Provider:   Randie Bustle A [[id number]]

## 2024-01-28 NOTE — Assessment & Plan Note (Signed)
BP stable on amlodipine.  

## 2024-01-28 NOTE — Patient Instructions (Signed)
 VISIT SUMMARY:  Today, we discussed your recent hospitalization for diverticulitis, your ongoing management of hypertension, and your GERD treatment. We reviewed your current medications and made some adjustments to ensure your conditions remain well-controlled.  YOUR PLAN:  DIVERTICULITIS WITH MICRO PERFORATION: You recently had a severe episode of diverticulitis with a small tear in your intestine, causing significant pain. You are currently on antibiotics, which may be causing diarrhea. -Continue taking Augmentin  as prescribed. -If diarrhea persists after finishing the antibiotics, please report it. -You will be referred to a gastroenterologist for a follow-up colonoscopy. -Consider taking a daily probiotic and increasing your yogurt intake to support gut health.  HYPERTENSION: Your blood pressure is well-controlled, but you have not taken metoprolol  for three weeks. We need to restart this medication for heart protection. -Restart metoprolol  at 25 mg, which is half your previous dose. -Ensure you have a 90-day supply of amlodipine  for future refills.  GASTROESOPHAGEAL REFLUX DISEASE (GERD): Your reflux symptoms are well-controlled with a reduced dose of Nexium. -Continue taking Nexium once daily as it is effectively managing your symptoms.

## 2024-01-28 NOTE — Assessment & Plan Note (Addendum)
 Clinically improved.  Recommended daily probiotic for gut health. Still having some loose stools but on augmentin - monitor. Complete antibiotics and arrange follow up colo with GI in 6-8 weeks.

## 2024-01-28 NOTE — Assessment & Plan Note (Addendum)
 Lab Results  Component Value Date   CHOL 167 01/01/2022   HDL 38 (L) 01/01/2022   LDLCALC 93 01/01/2022   LDLDIRECT 124.0 08/10/2019   TRIG 212 (H) 01/01/2022   CHOLHDL 4.4 01/01/2022  Continue crestor , update lipid panel. He does have hx of CAD and was previously on toprol  xl 50mg . Will restart at 25 mg xl daily.

## 2024-02-06 ENCOUNTER — Telehealth: Payer: Self-pay

## 2024-02-06 NOTE — Telephone Encounter (Signed)
Form received and placed in providers folder

## 2024-02-06 NOTE — Telephone Encounter (Signed)
 Copied from CRM [id number]. Topic: General - Other >> Feb 06, 2024 12:48 PM Luane Rumps D wrote: Reason for CRM: Patient advised that he needs a return to work form completed by Dorrene Gaucher, NP for his recent diagnosis of diverticulitis. Patient will be dropping off the form today along with fax/number/email so that the form can me sent to [facility].

## 2024-02-09 ENCOUNTER — Encounter: Payer: Self-pay | Admitting: Family

## 2024-02-09 ENCOUNTER — Telehealth: Payer: Self-pay | Admitting: Neurology

## 2024-02-09 NOTE — Telephone Encounter (Signed)
 Message sent for patient to be aware form will be completed tomorrow as provider is not in office today.

## 2024-02-09 NOTE — Telephone Encounter (Signed)
 Copied from CRM [id number]. Topic: General - Other >> Feb 09, 2024  8:43 AM Earnestine Goes B wrote: Reason for CRM: pt called to follow up on documentation to return to work. Pt is requesting it be uploaded to my chart or emailed to him [email or web address]. pt states he needs the letter completed today so he can return to work. Please call pt at [phone]

## 2024-02-10 ENCOUNTER — Telehealth: Payer: Self-pay | Admitting: Family

## 2024-02-10 NOTE — Telephone Encounter (Signed)
 Form completed.  Pt requests that we email him a copy of his form to [email or web address] please.

## 2024-02-10 NOTE — Telephone Encounter (Signed)
 Form emailed to patient and faxed to [facility]

## 2024-03-17 ENCOUNTER — Other Ambulatory Visit (HOSPITAL_COMMUNITY): Payer: Self-pay

## 2024-04-01 ENCOUNTER — Encounter: Payer: Self-pay | Admitting: Family

## 2024-04-26 ENCOUNTER — Other Ambulatory Visit: Payer: Self-pay | Admitting: Family

## 2024-04-26 DIAGNOSIS — Z8679 Personal history of other diseases of the circulatory system: Secondary | ICD-10-CM

## 2024-06-01 ENCOUNTER — Ambulatory Visit: Admitting: Family

## 2024-06-04 ENCOUNTER — Ambulatory Visit: Admitting: Family

## 2024-06-04 VITALS — BP 130/82 | HR 65 | Temp 97.7°F | Resp 16 | Ht 67.0 in | Wt 161.0 lb

## 2024-06-04 DIAGNOSIS — Z23 Encounter for immunization: Secondary | ICD-10-CM

## 2024-06-04 DIAGNOSIS — K219 Gastro-esophageal reflux disease without esophagitis: Secondary | ICD-10-CM | POA: Diagnosis not present

## 2024-06-04 DIAGNOSIS — F1010 Alcohol abuse, uncomplicated: Secondary | ICD-10-CM | POA: Diagnosis not present

## 2024-06-04 DIAGNOSIS — Z716 Tobacco abuse counseling: Secondary | ICD-10-CM

## 2024-06-04 DIAGNOSIS — Z8679 Personal history of other diseases of the circulatory system: Secondary | ICD-10-CM | POA: Diagnosis not present

## 2024-06-04 DIAGNOSIS — E782 Mixed hyperlipidemia: Secondary | ICD-10-CM | POA: Diagnosis not present

## 2024-06-04 MED ORDER — AMLODIPINE BESYLATE 10 MG PO TABS
10.0000 mg | ORAL_TABLET | Freq: Every day | ORAL | 0 refills | Status: AC
Start: 2024-06-04 — End: ?

## 2024-06-04 NOTE — Assessment & Plan Note (Signed)
 Drinks about 2 drinks/day.

## 2024-06-04 NOTE — Assessment & Plan Note (Signed)
 Stable on nexium otc.

## 2024-06-04 NOTE — Assessment & Plan Note (Signed)
 BP Readings from Last 3 Encounters:  06/04/24 130/82  01/28/24 120/76  01/27/24 138/88   Stable on amlodipine  and metoprolol . Continue same.

## 2024-06-04 NOTE — Patient Instructions (Signed)
 VISIT SUMMARY:  You came in for a follow-up on your hypertension and medication management. We discussed your current medications, your history of elevated triglycerides, and your knee stiffness post-surgery. We also reviewed your heartburn management and your efforts to reduce chewing tobacco use.  YOUR PLAN:  HYPERTENSION: Your blood pressure is well-controlled with your current medications. -Continue taking amlodipine  10 mg daily. -Continue taking metoprolol  XL 25 mg daily.  HYPERTRIGLYCERIDEMIA: Your previous labs showed elevated triglycerides. -We will order a lipid panel to check your cholesterol levels. -Please visit the lab immediately for the lipid panel.  GASTROESOPHAGEAL REFLUX DISEASE (GERD): Your heartburn symptoms are well-managed with your current medication. -Continue taking esomeprazole 20 mg daily.  NICOTINE DEPENDENCE, CHEWING TOBACCO: You have reduced your use of chewing tobacco. -Continue to reduce your use of chewing tobacco.  GENERAL HEALTH MAINTENANCE: You are due for a tetanus vaccination. -Please get the tetanus vaccine at the lab.

## 2024-06-04 NOTE — Assessment & Plan Note (Signed)
 Encouraged cessation of chewing tobacco.

## 2024-06-04 NOTE — Assessment & Plan Note (Addendum)
 Not currently taking statin. Update lipid panel.

## 2024-06-04 NOTE — Progress Notes (Unsigned)
 Subjective:     Patient ID: Darren Scott, male    DOB: 1971-06-17, 53 y.o.   MRN: [id number]  Chief Complaint  Patient presents with   Hypertension    Here for follow up    Hypertension    Discussed the use of AI scribe software for clinical note transcription with the patient, who gave verbal consent to proceed.  History of Present Illness  The patient presents for follow-up on hypertension and medication management.  They are currently taking amlodipine  10 mg and metoprolol  XL 25 mg daily for hypertension. They have a history of elevated triglycerides, with total cholesterol previously within normal limits, and were not taking rosuvastatin  at the time of their last cholesterol check due to a lack of refills.  They underwent double knee surgeries two years ago, resulting in stiffness and soreness, particularly during activities like roofing. They experience soreness even with knee pads.  They take Nexium 20 mg daily for heartburn, which is effective, and avoid certain foods like tomatoes to manage symptoms. They previously took Prilosec but now use a generic version of Nexium.  They have reduced their use of chewing tobacco due to increased cost and are attempting to quit. 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 They have recently increased their intake of vegetables and chicken, with a particular fondness for sweet potatoes. They work at [employer] and report limited close contact with others, describing people as having poor hygiene practices.     Health Maintenance Due  Topic Date Due   DTaP/Tdap/Td (2 - Td or Tdap) 12/30/2023   COVID-19 Vaccine (1 - 2024-25 season) Never done    Past Medical History:  Diagnosis Date   Acute bronchitis 08/16/2014   Acute medial meniscal tear 02/25/2022   Arthritis    LEFT knee   Atypical chest pain 02/20/2018   Dyslipidemia 02/20/2018   Frequent headaches    uses PRN meds   GERD  (gastroesophageal reflux disease)    on meds   History of hypertension 12/29/2013   Hyperglycemia 12/29/2013   Hyperlipidemia    Hypertension    on meds   Other and unspecified hyperlipidemia 12/29/2013   on meds   Routine general medical examination at a health care facility 12/29/2013   Seasonal allergies     Past Surgical History:  Procedure Laterality Date   KNEE ARTHROSCOPY Bilateral 04/25/2022   KNEE SURGERY Left 2007   WISDOM TOOTH EXTRACTION      Family History  Problem Relation Age of Onset   Heart attack Father        died in his 19's, smoker   Cancer Neg Hx    Diabetes Neg Hx    Colon cancer Neg Hx    Colon polyps Neg Hx    Esophageal cancer Neg Hx    Rectal cancer Neg Hx    Stomach cancer Neg Hx     Social History   Socioeconomic History   Marital status: Single    Spouse name: Not on file   Number of children: Not on file   Years of education: Not on file   Highest education level: 8th grade  Occupational History   Not on file  Tobacco Use   Smoking status: Never   Smokeless tobacco: Current    Types: Chew  Vaping Use   Vaping status: Never Used  Substance and Sexual Activity   Alcohol use: Yes    Alcohol/week: 18.0 standard drinks of alcohol  Types: 18 Cans of beer per week   Drug use: No   Sexual activity: Not on file  Other Topics Concern   Not on file  Social History Narrative   3 children 72 yr old son, 73 yr old daughter, son age 23    Separated, has joint custody.  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    Completed 8th grade   Enjoys- hunting/fishing golfing   Left handed   Social Drivers of Corporate investment banker Strain: Low Risk  (06/04/2024)   Overall Financial Resource Strain (CARDIA)    Difficulty of Paying Living Expenses: Not hard at all  Food Insecurity: No Food Insecurity (06/04/2024)   Hunger Vital Sign    Worried About Running Out of Food in the  Last Year: Never true    Ran Out of Food in the Last Year: Never true  Transportation Needs: No Transportation Needs (06/04/2024)   PRAPARE - Administrator, Civil Service (Medical): No    Lack of Transportation (Non-Medical): No  Physical Activity: Sufficiently Active (06/04/2024)   Exercise Vital Sign    Days of Exercise per Week: 5 days    Minutes of Exercise per Session: 30 min  Stress: No Stress Concern Present (06/04/2024)   [employer] of Occupational Health - Occupational Stress Questionnaire    Feeling of Stress: Only a little  Social Connections: Socially Isolated (06/04/2024)   Social Connection and Isolation Panel    Frequency of Communication with Friends and Family: Three times a week    Frequency of Social Gatherings with Friends and Family: Once a week    Attends Religious Services: Never    Database administrator or Organizations: No    Attends Engineer, structural: Not on file    Marital Status: Divorced  Intimate Partner Violence: Not At Risk (01/24/2024)   Humiliation, Afraid, Rape, and Kick questionnaire    Fear of Current or Ex-Partner: No    Emotionally Abused: No    Physically Abused: No    Sexually Abused: No    Outpatient Medications Prior to Visit  Medication Sig Dispense Refill   amoxicillin -clavulanate (AUGMENTIN ) 875-125 MG tablet Take 1 tablet by mouth 2 (two) times daily. 14 tablet 0   aspirin  EC 81 MG tablet Take 1 tablet (81 mg total) by mouth daily. 90 tablet 3   esomeprazole (NEXIUM) 20 MG capsule Take 20 mg by mouth daily at 12 noon.     metoprolol  succinate (TOPROL -XL) 25 MG 24 hr tablet Take 1 tablet (25 mg total) by mouth daily. 90 tablet 3   amLODipine  (NORVASC ) 10 MG tablet TAKE 1 TABLET BY MOUTH EVERY DAY 90 tablet 0   rosuvastatin  (CRESTOR ) 20 MG tablet TAKE 1 TABLET BY MOUTH EVERY DAY (Patient not taking: Reported on 06/04/2024) 90 tablet 0   No facility-administered medications prior to visit.    No Known  Allergies  ROS    See HPI Objective:    Physical Exam Constitutional:      General: He is not in acute distress.    Appearance: He is well-developed.  HENT:     Head: Normocephalic and atraumatic.  Cardiovascular:     Rate and Rhythm: Normal rate and regular rhythm.     Heart sounds: No murmur heard. Pulmonary:     Effort: Pulmonary effort is normal. No respiratory distress.     Breath sounds: Normal  breath sounds. No wheezing or rales.  Skin:    General: Skin is warm and dry.  Neurological:     Mental Status: He is alert and oriented to person, place, and time.  Psychiatric:        Behavior: Behavior normal.        Thought Content: Thought content normal.      BP 130/82 (BP Location: Right Arm, Patient Position: Sitting)   Pulse 65   Temp 97.7 F (36.5 C) (Oral)   Resp 16   Ht 5' 7 (1.702 m)   Wt 161 lb (73 kg)   SpO2 100%   BMI 25.22 kg/m  Wt Readings from Last 3 Encounters:  06/04/24 161 lb (73 kg)  01/28/24 157 lb (71.2 kg)  01/23/24 164 lb (74.4 kg)       Assessment & Plan:   Problem List Items Addressed This Visit       Unprioritized   Tobacco abuse counseling   Encouraged cessation of chewing tobacco.      Mixed hyperlipidemia   Not currently taking statin. Update lipid panel.       Relevant Medications   amLODipine  (NORVASC ) 10 MG tablet   Other Relevant Orders   Lipid panel   Basic Metabolic Panel (BMET)   History of hypertension - Primary   BP Readings from Last 3 Encounters:  06/04/24 130/82  01/28/24 120/76  01/27/24 138/88   Stable on amlodipine  and metoprolol . Continue same.       Relevant Medications   amLODipine  (NORVASC ) 10 MG tablet   Gastroesophageal reflux disease   Stable on nexium otc.        Alcohol abuse   Drinks about 2 drinks/day.       Other Visit Diagnoses       Need for Tdap vaccination       Relevant Orders   Tdap vaccine greater than or equal to 7yo IM       I have discontinued Ethanael  Kyllonen Jeff's rosuvastatin . I have also changed his amLODipine . Additionally, I am having him maintain his aspirin  EC, esomeprazole, amoxicillin -clavulanate, and metoprolol  succinate.  Meds ordered this encounter  Medications   amLODipine  (NORVASC ) 10 MG tablet    Sig: Take 1 tablet (10 mg total) by mouth daily.    Dispense:  90 tablet    Refill:  0    Supervising Provider:   DOMENICA BLACKBIRD A [[id number]]

## 2024-06-04 NOTE — Assessment & Plan Note (Deleted)
 Lab Results  Component Value Date   HGBA1C 5.6 06/29/2021

## 2024-06-05 LAB — LIPID PANEL
Cholesterol: 201 mg/dL — ABNORMAL HIGH (ref <200)
HDL: 45 mg/dL (ref >=40)
LDL Cholesterol (Calc): 116 mg/dL — ABNORMAL HIGH
Non-HDL Cholesterol (Calc): 156 mg/dL — ABNORMAL HIGH (ref <130)
Total CHOL/HDL Ratio: 4.5 (calc) (ref <5.0)
Triglycerides: 279 mg/dL — ABNORMAL HIGH (ref <150)

## 2024-06-05 LAB — BASIC METABOLIC PANEL WITH GFR
BUN: 16 mg/dL (ref 7–25)
CO2: 29 mmol/L (ref 20–32)
Calcium: 9.5 mg/dL (ref 8.6–10.3)
Chloride: 103 mmol/L (ref 98–110)
Creat: 0.89 mg/dL (ref 0.70–1.30)
Glucose, Bld: 85 mg/dL (ref 65–99)
Potassium: 4.1 mmol/L (ref 3.5–5.3)
Sodium: 139 mmol/L (ref 135–146)
eGFR: 102 mL/min/1.73m2 (ref >=60)

## 2024-06-06 ENCOUNTER — Ambulatory Visit: Payer: Self-pay | Admitting: Family

## 2024-08-17 ENCOUNTER — Ambulatory Visit: Admitting: Family

## 2024-08-17 VITALS — BP 134/78 | HR 85 | Temp 97.9°F | Resp 16 | Ht 67.0 in | Wt 158.0 lb

## 2024-08-17 DIAGNOSIS — M25561 Pain in right knee: Secondary | ICD-10-CM | POA: Diagnosis not present

## 2024-08-17 NOTE — Assessment & Plan Note (Signed)
 Chronic, will refer to ortho (previous ortho no longer in network).  Declines oral anti-inflammatory rx.  Suggested trial of voltaren gel.

## 2024-08-17 NOTE — Progress Notes (Signed)
 Subjective:     Patient ID: Darren Scott, male    DOB: 10/27/1970, 53 y.o.   MRN: [id number]  Chief Complaint  Patient presents with   Knee Pain    Patient complains of right pain. Post surgery 2 years ago    Knee Pain     Discussed the use of AI scribe software for clinical note transcription with the patient, who gave verbal consent to proceed.  History of Present Illness Darren Scott is a 53 year old male who presents with persistent right knee pain and numbness following surgery two years ago.  He has been experiencing ongoing issues with his right knee since undergoing arthroscopic surgery two years ago. The pain has progressively worsened, and he describes the sensation as numb and tingling when touched. Recently, while walking in his yard, he experienced significant discomfort despite wearing a knee brace, leading to an inability to walk or bend his leg for two days. Walking without a brace is sometimes manageable, but bending down is not possible, and he must take stairs slowly.  He experiences numbness extending down to his toes, particularly when sitting or driving, and reports swelling with fluid accumulation in the knee. He uses ice packs for relief, which allows him to move more comfortably after application. He has tried various topical treatments, including a CBD rub, which he finds effective but costly.  Prior to his surgery, he underwent physical therapy, x-rays, fluid aspiration, and an ultrasound, none of which provided relief. An MRI eventually revealed a tear, which led to the surgery. He is frustrated with the insurance process and the lack of improvement post-surgery.  He denies lower back pain. He notes that his knee cartilage is reportedly intact. He is concerned about his inability to perform activities he used to do before the knee issues began.    Health Maintenance Due  Topic Date Due   COVID-19 Vaccine (1 - 2025-26 season) Never done     Past Medical History:  Diagnosis Date   Acute bronchitis 08/16/2014   Acute medial meniscal tear 02/25/2022   Arthritis    LEFT knee   Atypical chest pain 02/20/2018   Dyslipidemia 02/20/2018   Frequent headaches    uses PRN meds   GERD (gastroesophageal reflux disease)    on meds   History of hypertension 12/29/2013   Hyperglycemia 12/29/2013   Hyperlipidemia    Hypertension    on meds   Other and unspecified hyperlipidemia 12/29/2013   on meds   Routine general medical examination at a health care facility 12/29/2013   Seasonal allergies     Past Surgical History:  Procedure Laterality Date   KNEE ARTHROSCOPY Bilateral 04/25/2022   KNEE SURGERY Left 2007   WISDOM TOOTH EXTRACTION      Family History  Problem Relation Age of Onset   Heart attack Father        died in his 82's, smoker   Cancer Neg Hx    Diabetes Neg Hx    Colon cancer Neg Hx    Colon polyps Neg Hx    Esophageal cancer Neg Hx    Rectal cancer Neg Hx    Stomach cancer Neg Hx     Social History   Socioeconomic History   Marital status: Single    Spouse name: Not on file   Number of children: Not on file   Years of education: Not on file   Highest education level: 8th grade  Occupational History  Not on file  Tobacco Use   Smoking status: Never   Smokeless tobacco: Current    Types: Chew  Vaping Use   Vaping status: Never Used  Substance and Sexual Activity   Alcohol use: Yes    Alcohol/week: 18.0 standard drinks of alcohol    Types: 18 Cans of beer per week   Drug use: No   Sexual activity: Not on file  Other Topics Concern   Not on file  Social History Narrative   3 children 59 yr old son, 15 yr old daughter, son age 31    Separated, has joint custody.  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    Completed 8th grade   Enjoys- hunting/fishing golfing   Left handed   Social Drivers of Health   Financial Resource  Strain: Patient Declined (08/15/2024)   Overall Financial Resource Strain (CARDIA)    Difficulty of Paying Living Expenses: Patient declined  Food Insecurity: Food Insecurity Present (08/15/2024)   Hunger Vital Sign    Worried About Running Out of Food in the Last Year: Sometimes true    Ran Out of Food in the Last Year: Sometimes true  Transportation Needs: Unknown (08/15/2024)   PRAPARE - Administrator, Civil Service (Medical): Not on file    Lack of Transportation (Non-Medical): No  Physical Activity: Sufficiently Active (06/04/2024)   Exercise Vital Sign    Days of Exercise per Week: 5 days    Minutes of Exercise per Session: 30 min  Stress: No Stress Concern Present (06/04/2024)   [employer] of Occupational Health - Occupational Stress Questionnaire    Feeling of Stress: Only a little  Social Connections: Unknown (08/15/2024)   Social Connection and Isolation Panel    Frequency of Communication with Friends and Family: Patient declined    Frequency of Social Gatherings with Friends and Family: Patient declined    Attends Religious Services: Patient declined    Database Administrator or Organizations: No    Attends Engineer, Structural: Not on file    Marital Status: Separated  Recent Concern: Social Connections - Socially Isolated (06/04/2024)   Social Connection and Isolation Panel    Frequency of Communication with Friends and Family: Three times a week    Frequency of Social Gatherings with Friends and Family: Once a week    Attends Religious Services: Never    Database Administrator or Organizations: No    Attends Engineer, Structural: Not on file    Marital Status: Divorced  Intimate Partner Violence: Not At Risk (01/24/2024)   Humiliation, Afraid, Rape, and Kick questionnaire    Fear of Current or Ex-Partner: No    Emotionally Abused: No    Physically Abused: No    Sexually Abused: No    Outpatient Medications Prior to Visit   Medication Sig Dispense Refill   amLODipine  (NORVASC ) 10 MG tablet Take 1 tablet (10 mg total) by mouth daily. 90 tablet 0   aspirin  EC 81 MG tablet Take 1 tablet (81 mg total) by mouth daily. 90 tablet 3   esomeprazole (NEXIUM) 20 MG capsule Take 20 mg by mouth daily at 12 noon.     metoprolol  succinate (TOPROL -XL) 25 MG 24 hr tablet Take 1 tablet (25 mg total) by mouth daily. 90 tablet 3   amoxicillin -clavulanate (AUGMENTIN ) 875-125 MG tablet Take 1 tablet by mouth 2 (two) times daily.  14 tablet 0   No facility-administered medications prior to visit.    No Known Allergies  ROS     Objective:    Physical Exam Constitutional:      Appearance: Normal appearance.  Cardiovascular:     Rate and Rhythm: Normal rate and regular rhythm.  Musculoskeletal:     Comments: Slight edema beneath right patella.  No erythema, good right knee stability  Skin:    General: Skin is warm and dry.  Neurological:     Mental Status: He is alert.      BP 134/78 (BP Location: Right Arm, Patient Position: Sitting, Cuff Size: Normal)   Pulse 85   Temp 97.9 F (36.6 C) (Oral)   Resp 16   Ht 5' 7 (1.702 m)   Wt 158 lb (71.7 kg)   SpO2 98%   BMI 24.75 kg/m  Wt Readings from Last 3 Encounters:  08/17/24 158 lb (71.7 kg)  06/04/24 161 lb (73 kg)  01/28/24 157 lb (71.2 kg)       Assessment & Plan:   Problem List Items Addressed This Visit       Unprioritized   Right knee pain - Primary   Chronic, will refer to ortho (previous ortho no longer in network).  Declines oral anti-inflammatory rx.  Suggested trial of voltaren gel.        Relevant Orders   Ambulatory referral to Orthopedic Surgery    I have discontinued Darren Scott Jeff's amoxicillin -clavulanate. I am also having him maintain his aspirin  EC, esomeprazole, metoprolol  succinate, and amLODipine .  No orders of the defined types were placed in this encounter.

## 2024-08-17 NOTE — Patient Instructions (Addendum)
  VISIT SUMMARY: Darren Scott, a 53 year old male, visited today due to persistent right knee pain and numbness following surgery two years ago. He has experienced worsening pain, numbness, and swelling, which have significantly impacted his mobility and daily activities. Despite various treatments, including physical therapy and topical applications, he has not found lasting relief.  YOUR PLAN: -CHRONIC RIGHT KNEE PAIN WITH NUMBNESS AND SWELLING: Chronic right knee pain with numbness and swelling suggests that there may be an ongoing issue with the knee, such as a possible meniscus re-tear or incomplete healing from the previous surgery. The plan includes a referral to [facility] for further evaluation and possible MRI to determine the exact cause. In the meantime, continue using ice packs to manage swelling and discomfort. You may also use Voltaren gel.  INSTRUCTIONS: Please follow up with [facility] for an evaluation. Continue using ice packs and consider using Voltaren gel to your right knee for relief.

## 2024-08-25 ENCOUNTER — Other Ambulatory Visit: Payer: Self-pay

## 2024-08-25 ENCOUNTER — Ambulatory Visit: Admitting: Orthopaedic Surgery

## 2024-08-25 ENCOUNTER — Ambulatory Visit: Admitting: Family

## 2024-08-25 DIAGNOSIS — G8929 Other chronic pain: Secondary | ICD-10-CM

## 2024-08-25 DIAGNOSIS — M25561 Pain in right knee: Secondary | ICD-10-CM

## 2024-08-25 MED ORDER — MELOXICAM 15 MG PO TABS: 15.0000 mg | ORAL_TABLET | Freq: Every day | ORAL | 2 refills | Status: AC

## 2024-08-25 NOTE — Progress Notes (Signed)
 Office Visit Note   Patient: Darren Scott           Date of Birth: 1971/01/24           MRN: [id number] Visit Date: 08/25/2024              Requested by: Daryl Setter, NP [id number] [place],  [place] [address] PCP: Daryl Setter, NP   Assessment & Plan: Visit Diagnoses:  1. Chronic pain of right knee     Plan: History of Present Illness Darren Scott is a 53 year old male who presents with persistent right knee pain following meniscus surgery.  He had bilateral knee scopes by another surgeon in town a few years ago.  He underwent bilateral meniscus surgery, but his right knee pain did not improve. Pain is localized to the right knee and worsens after 4 to 5 hours of work and with stairs, kneeling, squatting, prolonged standing, and being on his feet for a couple of hours. Pain improves somewhat with movement.  He uses a knee brace for walking activities. After a recent day of increased activity he had marked pain and swelling, with inability to bend the knee or walk for two days. He notes the knee sometimes fills with fluid after activity and becomes puffy and sore at the end of the day.  He describes a persistent "nummy" feeling to touch around the knee since surgery. He denies clicking, catching, or locking. He is not taking medications for pain or swelling, has not used meloxicam  or other anti-inflammatories, and uses ice for relief.  Physical Exam MUSCULOSKELETAL: No effusion or joint line tenderness in right knee.  Normal range of motion.  Collaterals and cruciates are stable.  Assessment and Plan Right knee pain with osteoarthritis Chronic pain post-meniscus surgery with swelling and numbness. Possible early arthritis or mild inflammation.  Not reporting symptoms to suggest a re-tear of the meniscus. - Prescribed meloxicam , one tablet in the morning for two weeks. - Advised use of ice packs for swelling.  Follow-Up Instructions: No  follow-ups on file.   Orders:  Orders Placed This Encounter  Procedures  . XR KNEE 3 VIEW RIGHT   Meds ordered this encounter  Medications  . meloxicam  (MOBIC ) 15 MG tablet    Sig: Take 1 tablet (15 mg total) by mouth daily.    Dispense:  14 tablet    Refill:  2      Procedures: No procedures performed   Clinical Data: No additional findings.   Subjective: Chief Complaint  Patient presents with  . Right Knee - Pain    HPI  Review of Systems  Constitutional: Negative.   HENT: Negative.    Eyes: Negative.   Respiratory: Negative.    Cardiovascular: Negative.   Gastrointestinal: Negative.   Endocrine: Negative.   Genitourinary: Negative.   Skin: Negative.   Allergic/Immunologic: Negative.   Neurological: Negative.   Hematological: Negative.   Psychiatric/Behavioral: Negative.    All other systems reviewed and are negative.    Objective: Vital Signs: There were no vitals taken for this visit.  Physical Exam Vitals and nursing note reviewed.  Constitutional:      Appearance: He is well-developed.  HENT:     Head: Normocephalic and atraumatic.  Eyes:     Pupils: Pupils are equal, round, and reactive to light.  Pulmonary:     Effort: Pulmonary effort is normal.  Abdominal:     Palpations: Abdomen is soft.  Musculoskeletal:        General: Normal range of motion.     Cervical back: Neck supple.  Skin:    General: Skin is warm.  Neurological:     Mental Status: He is alert and oriented to person, place, and time.  Psychiatric:        Behavior: Behavior normal.        Thought Content: Thought content normal.        Judgment: Judgment normal.     Ortho Exam  Specialty Comments:  No specialty comments available.  Imaging: XR KNEE 3 VIEW RIGHT Result Date: 08/25/2024 X-rays of the right knee show no acute or structural abnormalities    PMFS History: Patient Active Problem List   Diagnosis Date Noted  . Right knee pain 08/17/2024  .  Sigmoid diverticulitis 01/23/2024  . Gastroesophageal reflux disease 07/03/2022  . Alcohol abuse 01/02/2022  . Tobacco abuse counseling 01/02/2022  . Coronary artery disease involving native coronary artery of native heart without angina pectoris 01/01/2022  . Mixed hyperlipidemia 01/01/2022  . OA (osteoarthritis) of knee 09/14/2021  . Chronic nonintractable headache 06/29/2021  . Dyslipidemia 02/20/2018  . Routine general medical examination at a health care facility 12/29/2013  . History of hypertension 12/29/2013  . Hyperglycemia 12/29/2013   Past Medical History:  Diagnosis Date  . Acute bronchitis 08/16/2014  . Acute medial meniscal tear 02/25/2022  . Arthritis    LEFT knee  . Atypical chest pain 02/20/2018  . Dyslipidemia 02/20/2018  . Frequent headaches    uses PRN meds  . GERD (gastroesophageal reflux disease)    on meds  . History of hypertension 12/29/2013  . Hyperglycemia 12/29/2013  . Hyperlipidemia   . Hypertension    on meds  . Other and unspecified hyperlipidemia 12/29/2013   on meds  . Routine general medical examination at a health care facility 12/29/2013  . Seasonal allergies     Family History  Problem Relation Age of Onset  . Heart attack Father        died in his 37's, smoker  . Cancer Neg Hx   . Diabetes Neg Hx   . Colon cancer Neg Hx   . Colon polyps Neg Hx   . Esophageal cancer Neg Hx   . Rectal cancer Neg Hx   . Stomach cancer Neg Hx     Past Surgical History:  Procedure Laterality Date  . KNEE ARTHROSCOPY Bilateral 04/25/2022  . KNEE SURGERY Left 2007  . WISDOM TOOTH EXTRACTION     Social History   Occupational History  . Not on file  Tobacco Use  . Smoking status: Never  . Smokeless tobacco: Current    Types: Chew  Vaping Use  . Vaping status: Never Used  Substance and Sexual Activity  . Alcohol use: Yes    Alcohol/week: 18.0 standard drinks of alcohol    Types: 18 Cans of beer per week  . Drug use: No  . Sexual activity:  Not on file
